# Patient Record
Sex: Female | Born: 1935
Health system: Southern US, Community
[De-identification: ages and names within clinical notes are randomized; demographics above are authoritative.]

## PROBLEM LIST (undated history)

## (undated) DIAGNOSIS — H409 Unspecified glaucoma: Secondary | ICD-10-CM

## (undated) DIAGNOSIS — E0590 Thyrotoxicosis, unspecified without thyrotoxic crisis or storm: Secondary | ICD-10-CM

## (undated) DIAGNOSIS — I10 Essential (primary) hypertension: Secondary | ICD-10-CM

## (undated) DIAGNOSIS — M1990 Unspecified osteoarthritis, unspecified site: Secondary | ICD-10-CM

## (undated) DIAGNOSIS — I872 Venous insufficiency (chronic) (peripheral): Secondary | ICD-10-CM

## (undated) DIAGNOSIS — I2699 Other pulmonary embolism without acute cor pulmonale: Secondary | ICD-10-CM

## (undated) DIAGNOSIS — I82532 Chronic embolism and thrombosis of left popliteal vein: Secondary | ICD-10-CM

## (undated) DIAGNOSIS — I517 Cardiomegaly: Secondary | ICD-10-CM

## (undated) DIAGNOSIS — I82409 Acute embolism and thrombosis of unspecified deep veins of unspecified lower extremity: Secondary | ICD-10-CM

## (undated) HISTORY — PX: BREAST LUMPECTOMY: SHX2

## (undated) HISTORY — DX: Unspecified glaucoma: H40.9

## (undated) HISTORY — DX: Unspecified osteoarthritis, unspecified site: M19.90

## (undated) HISTORY — PX: TUBAL LIGATION: SHX77

---

## 2006-03-02 ENCOUNTER — Ambulatory Visit: Payer: Self-pay | Admitting: Cardiology

## 2006-03-15 ENCOUNTER — Ambulatory Visit: Payer: Self-pay | Admitting: Cardiology

## 2006-03-25 ENCOUNTER — Ambulatory Visit: Payer: Self-pay | Admitting: Internal Medicine

## 2006-03-25 ENCOUNTER — Inpatient Hospital Stay (HOSPITAL_BASED_OUTPATIENT_CLINIC_OR_DEPARTMENT_OTHER): Admission: RE | Admit: 2006-03-25 | Discharge: 2006-03-25 | Payer: Self-pay | Admitting: Internal Medicine

## 2006-04-11 ENCOUNTER — Ambulatory Visit: Payer: Self-pay | Admitting: Cardiology

## 2006-07-18 ENCOUNTER — Ambulatory Visit: Payer: Self-pay | Admitting: Internal Medicine

## 2006-07-18 ENCOUNTER — Ambulatory Visit (HOSPITAL_COMMUNITY): Admission: RE | Admit: 2006-07-18 | Discharge: 2006-07-18 | Payer: Self-pay | Admitting: Gastroenterology

## 2013-07-16 ENCOUNTER — Other Ambulatory Visit (HOSPITAL_COMMUNITY): Payer: Self-pay | Admitting: Endocrinology

## 2013-07-16 DIAGNOSIS — E0590 Thyrotoxicosis, unspecified without thyrotoxic crisis or storm: Secondary | ICD-10-CM

## 2013-07-25 ENCOUNTER — Encounter (HOSPITAL_COMMUNITY)
Admission: RE | Admit: 2013-07-25 | Discharge: 2013-07-25 | Disposition: A | Discharge status: Home / Self Care | Payer: Medicare Other | Source: Ambulatory Visit | Attending: Endocrinology | Admitting: Endocrinology

## 2013-07-25 DIAGNOSIS — E0590 Thyrotoxicosis, unspecified without thyrotoxic crisis or storm: Secondary | ICD-10-CM | POA: Insufficient documentation

## 2013-07-26 ENCOUNTER — Encounter (HOSPITAL_COMMUNITY)
Admission: RE | Admit: 2013-07-26 | Discharge: 2013-07-26 | Disposition: A | Discharge status: Home / Self Care | Payer: Medicare Other | Source: Ambulatory Visit | Attending: Endocrinology | Admitting: Endocrinology

## 2013-07-26 ENCOUNTER — Encounter (HOSPITAL_COMMUNITY): Payer: Self-pay

## 2013-07-26 HISTORY — DX: Thyrotoxicosis, unspecified without thyrotoxic crisis or storm: E05.90

## 2013-07-26 MED ORDER — SODIUM PERTECHNETATE TC 99M INJECTION
11.0000 | Freq: Once | INTRAVENOUS | Status: AC | PRN
  Administered 2013-07-26: 11 via INTRAVENOUS

## 2013-07-26 MED ORDER — SODIUM IODIDE I 131 CAPSULE
16.6000 | Freq: Once | INTRAVENOUS | Status: AC | PRN
  Administered 2013-07-26: 16.6 via ORAL

## 2013-08-03 ENCOUNTER — Other Ambulatory Visit: Payer: Self-pay | Admitting: Endocrinology

## 2013-08-03 DIAGNOSIS — E041 Nontoxic single thyroid nodule: Secondary | ICD-10-CM

## 2013-08-07 ENCOUNTER — Other Ambulatory Visit: Payer: Self-pay | Admitting: Endocrinology

## 2013-08-07 DIAGNOSIS — E041 Nontoxic single thyroid nodule: Secondary | ICD-10-CM

## 2013-08-14 ENCOUNTER — Ambulatory Visit
Admission: RE | Admit: 2013-08-14 | Discharge: 2013-08-14 | Disposition: A | Discharge status: Home / Self Care | Payer: Medicare Other | Source: Ambulatory Visit | Attending: Endocrinology | Admitting: Endocrinology

## 2013-08-14 ENCOUNTER — Other Ambulatory Visit (HOSPITAL_COMMUNITY)
Admission: RE | Admit: 2013-08-14 | Discharge: 2013-08-14 | Disposition: A | Discharge status: Home / Self Care | Payer: Medicare Other | Source: Ambulatory Visit | Attending: Interventional Radiology | Admitting: Interventional Radiology

## 2013-08-14 DIAGNOSIS — E041 Nontoxic single thyroid nodule: Secondary | ICD-10-CM | POA: Insufficient documentation

## 2016-02-17 DIAGNOSIS — I10 Essential (primary) hypertension: Secondary | ICD-10-CM | POA: Diagnosis not present

## 2016-02-17 DIAGNOSIS — R350 Frequency of micturition: Secondary | ICD-10-CM | POA: Diagnosis not present

## 2016-02-17 DIAGNOSIS — Z87891 Personal history of nicotine dependence: Secondary | ICD-10-CM | POA: Diagnosis not present

## 2016-02-17 DIAGNOSIS — M79609 Pain in unspecified limb: Secondary | ICD-10-CM | POA: Diagnosis not present

## 2016-02-17 DIAGNOSIS — Z6838 Body mass index (BMI) 38.0-38.9, adult: Secondary | ICD-10-CM | POA: Diagnosis not present

## 2016-03-31 DIAGNOSIS — Z01419 Encounter for gynecological examination (general) (routine) without abnormal findings: Secondary | ICD-10-CM | POA: Diagnosis not present

## 2016-03-31 DIAGNOSIS — K625 Hemorrhage of anus and rectum: Secondary | ICD-10-CM | POA: Diagnosis not present

## 2016-05-19 DIAGNOSIS — K625 Hemorrhage of anus and rectum: Secondary | ICD-10-CM | POA: Diagnosis not present

## 2016-05-19 DIAGNOSIS — Z800 Family history of malignant neoplasm of digestive organs: Secondary | ICD-10-CM | POA: Diagnosis not present

## 2016-05-26 DIAGNOSIS — R5383 Other fatigue: Secondary | ICD-10-CM | POA: Diagnosis not present

## 2016-05-26 DIAGNOSIS — Z6838 Body mass index (BMI) 38.0-38.9, adult: Secondary | ICD-10-CM | POA: Diagnosis not present

## 2016-05-26 DIAGNOSIS — Z7189 Other specified counseling: Secondary | ICD-10-CM | POA: Diagnosis not present

## 2016-05-26 DIAGNOSIS — Z0000 Encounter for general adult medical examination without abnormal findings: Secondary | ICD-10-CM | POA: Diagnosis not present

## 2016-05-26 DIAGNOSIS — Z299 Encounter for prophylactic measures, unspecified: Secondary | ICD-10-CM | POA: Diagnosis not present

## 2016-05-26 DIAGNOSIS — E559 Vitamin D deficiency, unspecified: Secondary | ICD-10-CM | POA: Diagnosis not present

## 2016-05-26 DIAGNOSIS — Z1211 Encounter for screening for malignant neoplasm of colon: Secondary | ICD-10-CM | POA: Diagnosis not present

## 2016-05-26 DIAGNOSIS — N390 Urinary tract infection, site not specified: Secondary | ICD-10-CM | POA: Diagnosis not present

## 2016-05-26 DIAGNOSIS — Z1389 Encounter for screening for other disorder: Secondary | ICD-10-CM | POA: Diagnosis not present

## 2016-05-26 DIAGNOSIS — E7800 Pure hypercholesterolemia, unspecified: Secondary | ICD-10-CM | POA: Diagnosis not present

## 2016-05-26 DIAGNOSIS — Z79899 Other long term (current) drug therapy: Secondary | ICD-10-CM | POA: Diagnosis not present

## 2016-05-26 DIAGNOSIS — R350 Frequency of micturition: Secondary | ICD-10-CM | POA: Diagnosis not present

## 2016-06-08 DIAGNOSIS — Z800 Family history of malignant neoplasm of digestive organs: Secondary | ICD-10-CM | POA: Diagnosis not present

## 2016-06-08 DIAGNOSIS — Z9851 Tubal ligation status: Secondary | ICD-10-CM | POA: Diagnosis not present

## 2016-06-08 DIAGNOSIS — I10 Essential (primary) hypertension: Secondary | ICD-10-CM | POA: Diagnosis not present

## 2016-06-08 DIAGNOSIS — Z79899 Other long term (current) drug therapy: Secondary | ICD-10-CM | POA: Diagnosis not present

## 2016-06-08 DIAGNOSIS — D122 Benign neoplasm of ascending colon: Secondary | ICD-10-CM | POA: Diagnosis not present

## 2016-06-08 DIAGNOSIS — D123 Benign neoplasm of transverse colon: Secondary | ICD-10-CM | POA: Diagnosis not present

## 2016-06-08 DIAGNOSIS — K625 Hemorrhage of anus and rectum: Secondary | ICD-10-CM | POA: Diagnosis not present

## 2016-06-08 DIAGNOSIS — Z8489 Family history of other specified conditions: Secondary | ICD-10-CM | POA: Diagnosis not present

## 2016-06-11 DIAGNOSIS — I10 Essential (primary) hypertension: Secondary | ICD-10-CM | POA: Diagnosis not present

## 2016-06-11 DIAGNOSIS — M79609 Pain in unspecified limb: Secondary | ICD-10-CM | POA: Diagnosis not present

## 2016-06-11 DIAGNOSIS — I491 Atrial premature depolarization: Secondary | ICD-10-CM | POA: Diagnosis not present

## 2016-06-21 DIAGNOSIS — M79609 Pain in unspecified limb: Secondary | ICD-10-CM | POA: Diagnosis not present

## 2016-06-21 DIAGNOSIS — Z800 Family history of malignant neoplasm of digestive organs: Secondary | ICD-10-CM | POA: Diagnosis not present

## 2016-06-21 DIAGNOSIS — D122 Benign neoplasm of ascending colon: Secondary | ICD-10-CM | POA: Diagnosis not present

## 2016-06-21 DIAGNOSIS — K625 Hemorrhage of anus and rectum: Secondary | ICD-10-CM | POA: Diagnosis not present

## 2016-06-21 DIAGNOSIS — Z1211 Encounter for screening for malignant neoplasm of colon: Secondary | ICD-10-CM | POA: Diagnosis not present

## 2016-06-30 DIAGNOSIS — I739 Peripheral vascular disease, unspecified: Secondary | ICD-10-CM | POA: Diagnosis not present

## 2016-07-14 DIAGNOSIS — I10 Essential (primary) hypertension: Secondary | ICD-10-CM | POA: Diagnosis not present

## 2016-07-14 DIAGNOSIS — N390 Urinary tract infection, site not specified: Secondary | ICD-10-CM | POA: Diagnosis not present

## 2016-07-14 DIAGNOSIS — E7800 Pure hypercholesterolemia, unspecified: Secondary | ICD-10-CM | POA: Diagnosis not present

## 2016-07-14 DIAGNOSIS — R350 Frequency of micturition: Secondary | ICD-10-CM | POA: Diagnosis not present

## 2016-07-29 ENCOUNTER — Encounter: Payer: Self-pay | Admitting: Surgery

## 2016-08-02 ENCOUNTER — Encounter: Payer: Medicare Other | Admitting: Surgery

## 2016-08-10 ENCOUNTER — Emergency Department (HOSPITAL_COMMUNITY): Payer: PPO

## 2016-08-10 ENCOUNTER — Encounter (HOSPITAL_COMMUNITY): Payer: Self-pay | Admitting: Emergency Medicine

## 2016-08-10 ENCOUNTER — Inpatient Hospital Stay (HOSPITAL_COMMUNITY)
Admission: EM | Admit: 2016-08-10 | Discharge: 2016-08-12 | DRG: 176 | Disposition: A | Discharge status: Home / Self Care | Payer: PPO | Attending: Family Medicine | Admitting: Family Medicine

## 2016-08-10 ENCOUNTER — Other Ambulatory Visit: Payer: Self-pay

## 2016-08-10 DIAGNOSIS — E0590 Thyrotoxicosis, unspecified without thyrotoxic crisis or storm: Secondary | ICD-10-CM | POA: Diagnosis present

## 2016-08-10 DIAGNOSIS — I82629 Acute embolism and thrombosis of deep veins of unspecified upper extremity: Secondary | ICD-10-CM

## 2016-08-10 DIAGNOSIS — I82432 Acute embolism and thrombosis of left popliteal vein: Secondary | ICD-10-CM | POA: Diagnosis present

## 2016-08-10 DIAGNOSIS — I2699 Other pulmonary embolism without acute cor pulmonale: Principal | ICD-10-CM | POA: Diagnosis present

## 2016-08-10 DIAGNOSIS — R079 Chest pain, unspecified: Secondary | ICD-10-CM | POA: Diagnosis not present

## 2016-08-10 DIAGNOSIS — I10 Essential (primary) hypertension: Secondary | ICD-10-CM | POA: Diagnosis not present

## 2016-08-10 DIAGNOSIS — I82409 Acute embolism and thrombosis of unspecified deep veins of unspecified lower extremity: Secondary | ICD-10-CM | POA: Insufficient documentation

## 2016-08-10 DIAGNOSIS — R0602 Shortness of breath: Secondary | ICD-10-CM | POA: Diagnosis not present

## 2016-08-10 DIAGNOSIS — I824Z2 Acute embolism and thrombosis of unspecified deep veins of left distal lower extremity: Secondary | ICD-10-CM

## 2016-08-10 DIAGNOSIS — Z87891 Personal history of nicotine dependence: Secondary | ICD-10-CM

## 2016-08-10 DIAGNOSIS — R0600 Dyspnea, unspecified: Secondary | ICD-10-CM

## 2016-08-10 HISTORY — DX: Essential (primary) hypertension: I10

## 2016-08-10 HISTORY — DX: Cardiomegaly: I51.7

## 2016-08-10 LAB — TSH: TSH: 0.545 u[IU]/mL (ref 0.350–4.500)

## 2016-08-10 LAB — CBC WITH DIFFERENTIAL/PLATELET
BASOS ABS: 0 10*3/uL (ref 0.0–0.1)
BASOS PCT: 0 %
EOS ABS: 0.1 10*3/uL (ref 0.0–0.7)
Eosinophils Relative: 1 %
HCT: 39.3 % (ref 36.0–46.0)
Hemoglobin: 12.8 g/dL (ref 12.0–15.0)
Lymphocytes Relative: 24 %
Lymphs Abs: 3 10*3/uL (ref 0.7–4.0)
MCH: 28.1 pg (ref 26.0–34.0)
MCHC: 32.6 g/dL (ref 30.0–36.0)
MCV: 86.2 fL (ref 78.0–100.0)
MONO ABS: 0.8 10*3/uL (ref 0.1–1.0)
MONOS PCT: 7 %
NEUTROS ABS: 8.4 10*3/uL — AB (ref 1.7–7.7)
Neutrophils Relative %: 68 %
PLATELETS: 253 10*3/uL (ref 150–400)
RBC: 4.56 MIL/uL (ref 3.87–5.11)
RDW: 14.6 % (ref 11.5–15.5)
WBC: 12.3 10*3/uL — ABNORMAL HIGH (ref 4.0–10.5)

## 2016-08-10 LAB — BASIC METABOLIC PANEL
ANION GAP: 7 (ref 5–15)
BUN: 12 mg/dL (ref 6–20)
CALCIUM: 9.4 mg/dL (ref 8.9–10.3)
CO2: 29 mmol/L (ref 22–32)
CREATININE: 0.66 mg/dL (ref 0.44–1.00)
Chloride: 104 mmol/L (ref 101–111)
Glucose, Bld: 96 mg/dL (ref 65–99)
Potassium: 3.9 mmol/L (ref 3.5–5.1)
SODIUM: 140 mmol/L (ref 135–145)

## 2016-08-10 LAB — TROPONIN I: TROPONIN I: 0.03 ng/mL — AB (ref <0.03)

## 2016-08-10 LAB — BRAIN NATRIURETIC PEPTIDE: B NATRIURETIC PEPTIDE 5: 17 pg/mL (ref 0.0–100.0)

## 2016-08-10 MED ORDER — SODIUM CHLORIDE 0.9 % IV SOLN
INTRAVENOUS | Status: DC
  Administered 2016-08-10: 22:00:00 via INTRAVENOUS

## 2016-08-10 MED ORDER — ENOXAPARIN SODIUM 100 MG/ML ~~LOC~~ SOLN
100.0000 mg | Freq: Two times a day (BID) | SUBCUTANEOUS | Status: DC
  Administered 2016-08-11 – 2016-08-12 (×3): 100 mg via SUBCUTANEOUS
  Filled 2016-08-10 (×3): qty 1

## 2016-08-10 MED ORDER — ENOXAPARIN SODIUM 100 MG/ML ~~LOC~~ SOLN
1.0000 mg/kg | Freq: Once | SUBCUTANEOUS | Status: AC
  Administered 2016-08-10: 95 mg via SUBCUTANEOUS
  Filled 2016-08-10: qty 1

## 2016-08-10 MED ORDER — ONDANSETRON HCL 4 MG PO TABS: 4.0000 mg | ORAL_TABLET | Freq: Four times a day (QID) | ORAL | Status: DC | PRN

## 2016-08-10 MED ORDER — ASPIRIN 81 MG PO CHEW
162.0000 mg | CHEWABLE_TABLET | Freq: Once | ORAL | Status: AC
  Administered 2016-08-10: 162 mg via ORAL
  Filled 2016-08-10: qty 2

## 2016-08-10 MED ORDER — ACETAMINOPHEN 325 MG PO TABS: 650.0000 mg | ORAL_TABLET | Freq: Four times a day (QID) | ORAL | Status: DC | PRN

## 2016-08-10 MED ORDER — MORPHINE SULFATE (PF) 2 MG/ML IV SOLN
2.0000 mg | INTRAVENOUS | Status: DC | PRN
  Administered 2016-08-10 – 2016-08-11 (×2): 2 mg via INTRAVENOUS
  Filled 2016-08-10 (×2): qty 1

## 2016-08-10 MED ORDER — METOPROLOL SUCCINATE ER 25 MG PO TB24
12.5000 mg | ORAL_TABLET | Freq: Every day | ORAL | Status: DC
  Administered 2016-08-11 – 2016-08-12 (×2): 12.5 mg via ORAL
  Filled 2016-08-10 (×2): qty 1

## 2016-08-10 MED ORDER — OXYCODONE-ACETAMINOPHEN 5-325 MG PO TABS: 1.0000 | ORAL_TABLET | ORAL | Status: DC | PRN

## 2016-08-10 MED ORDER — MORPHINE SULFATE (PF) 2 MG/ML IV SOLN
2.0000 mg | INTRAVENOUS | Status: DC | PRN
  Administered 2016-08-11: 2 mg via INTRAVENOUS
  Filled 2016-08-10: qty 1

## 2016-08-10 MED ORDER — ACETAMINOPHEN 650 MG RE SUPP
650.0000 mg | Freq: Four times a day (QID) | RECTAL | Status: DC | PRN
Start: 2016-08-10 — End: 2016-08-12

## 2016-08-10 MED ORDER — LISINOPRIL 10 MG PO TABS
20.0000 mg | ORAL_TABLET | Freq: Every day | ORAL | Status: DC
  Administered 2016-08-11 – 2016-08-12 (×2): 20 mg via ORAL
  Filled 2016-08-10 (×2): qty 2

## 2016-08-10 MED ORDER — IOPAMIDOL (ISOVUE-370) INJECTION 76%
100.0000 mL | Freq: Once | INTRAVENOUS | Status: AC | PRN
  Administered 2016-08-10: 100 mL via INTRAVENOUS

## 2016-08-10 MED ORDER — ONDANSETRON HCL 4 MG/2ML IJ SOLN: 4.0000 mg | Freq: Four times a day (QID) | INTRAMUSCULAR | Status: DC | PRN

## 2016-08-10 NOTE — ED Notes (Signed)
CRITICAL VALUE ALERT  Critical value received:  Trop 0.03  Date of notification:  08/10/16  Time of notification: 1548  Critical value read back:Yes.    Nurse who received alert:  R.Meredyth Hornung

## 2016-08-10 NOTE — ED Notes (Signed)
Returned from CT.

## 2016-08-10 NOTE — ED Provider Notes (Signed)
[facility] DEPT Provider Note   CSN: [id number] Arrival date & time: 08/10/16  1358     History   Chief Complaint Chief Complaint  Patient presents with  . Shortness of Breath    HPI Diana [name] is a 82 y.o. female.  Patient with history of high blood pressure and hyperthyroidism, blood clot years ago over 20 years presents with shortness of breath, chest discomfort and left leg swelling. Symptoms mostly for the past 2 days. Patient had brief one hour duration of chest pressure that resolved earlier today. No history of similar. No cardiac history. No blood clot history, no active cancer, no recent surgeries. Mild calf ache which is new for the past 2 days. No injuries. Mild exertional shortness of breath. No history of atrial fibrillation. No significant symptoms currently.      Past Medical History:  Diagnosis Date  . Enlarged heart   . Hypertension   . Hyperthyroidism     Patient Active Problem List   Diagnosis Date Noted  . DVT (deep vein thrombosis) in pregnancy 08/10/2016  . Pulmonary embolism (HCC) 08/10/2016    Past Surgical History:  Procedure Laterality Date  . BREAST LUMPECTOMY    . TUBAL LIGATION      OB History    No data available       Home Medications    Prior to Admission medications   Medication Sig Start Date End Date Taking? Authorizing Provider  aspirin EC 81 MG tablet Take 81 mg by mouth daily.   Yes Historical Provider, MD  lisinopril (PRINIVIL,ZESTRIL) 20 MG tablet Take 20 mg by mouth daily.  06/11/16  Yes Historical Provider, MD  meclizine (ANTIVERT) 12.5 MG tablet Take 12.5 mg by mouth 3 (three) times daily as needed for dizziness or nausea.  07/14/16  Yes Historical Provider, MD  metoprolol succinate (TOPROL-XL) 25 MG 24 hr tablet Take 12.5 mg by mouth daily. 06/11/16  Yes Historical Provider, MD    Family History History reviewed. No pertinent family history.  Social History Social History  Substance Use Topics  .  Smoking status: Former Games developer  . Smokeless tobacco: Never Used  . Alcohol use No     Allergies   Review of patient's allergies indicates no known allergies.   Review of Systems Review of Systems  Constitutional: Positive for fatigue. Negative for chills and fever.  HENT: Negative for congestion.   Eyes: Negative for visual disturbance.  Respiratory: Positive for shortness of breath.   Cardiovascular: Positive for chest pain and leg swelling.  Gastrointestinal: Negative for abdominal pain and vomiting.  Genitourinary: Negative for dysuria and flank pain.  Musculoskeletal: Negative for back pain, neck pain and neck stiffness.  Skin: Negative for rash.  Neurological: Negative for light-headedness and headaches.     Physical Exam Updated Vital Signs BP 153/81 (BP Location: Left Arm)   Pulse (!) 55   Temp 98.7 F (37.1 C) (Oral)   Resp 18   Ht 5\' 3"  (1.6 m)   Wt 210 lb (95.3 kg)   SpO2 99%   BMI 37.20 kg/m   Physical Exam  Constitutional: She appears well-developed and well-nourished. No distress.  HENT:  Head: Normocephalic and atraumatic.  Eyes: Conjunctivae are normal.  Neck: Neck supple.  Cardiovascular: Normal rate and regular rhythm.   No murmur heard. Pulmonary/Chest: Effort normal and breath sounds normal. No respiratory distress.  Abdominal: Soft. There is no tenderness.  Musculoskeletal: She exhibits edema and tenderness (mild tenderness and swelling left calfneurovascular intact  distal left leg).  Neurological: She is alert.  Skin: Skin is warm and dry.  Psychiatric: She has a normal mood and affect.  Nursing note and vitals reviewed.    ED Treatments / Results  Labs (all labs ordered are listed, but only abnormal results are displayed) Labs Reviewed  CBC WITH DIFFERENTIAL/PLATELET - Abnormal; Notable for the following:       Result Value   WBC 12.3 (*)    Neutro Abs 8.4 (*)    All other components within normal limits  TROPONIN I - Abnormal;  Notable for the following:    Troponin I 0.03 (*)    All other components within normal limits  BASIC METABOLIC PANEL  TROPONIN I  BRAIN NATRIURETIC PEPTIDE    EKG  EKG Interpretation  Date/Time:  Tuesday August 10 2016 17:23:32 EDT Ventricular Rate:  92 PR Interval:    QRS Duration: 73 QT Interval:  386 QTC Calculation: 478 R Axis:   52 Text Interpretation:  Sinus rhythm Multiple premature complexes, vent & supraven Borderline repolarization abnormality Confirmed by Jodi MourningZAVITZ MD, Sakia Schrimpf [phone]) on 08/10/2016 5:59:23 PM       Radiology Dg Chest 2 View  Result Date: 08/10/2016 CLINICAL DATA:  Chest pain. EXAM: CHEST  2 VIEW COMPARISON:  09/25/2011 . FINDINGS: Mediastinum hilar structures normal. Cardiomegaly. No focal pulmonary infiltrate. No pleural effusion or pneumothorax. No acute bony abnormality. Degenerative changes thoracic spine . IMPRESSION: 1. Cardiomegaly.  No pulmonary venous congestion. 2.  No acute pulmonary disease. Electronically Signed   By: Maisie Fushomas  Register   On: 08/10/2016 14:53   Ct Angio Chest Pe W And/or Wo Contrast  Result Date: 08/10/2016 CLINICAL DATA:  Shortness of breath, chest pain, left leg pain for 3 months. Acute left popliteal DVT. EXAM: CT ANGIOGRAPHY CHEST WITH CONTRAST TECHNIQUE: Multidetector CT imaging of the chest was performed using the standard protocol during bolus administration of intravenous contrast. Multiplanar CT image reconstructions and MIPs were obtained to evaluate the vascular anatomy. CONTRAST:  100 cc Isovue 370 COMPARISON:  08/10/2016 FINDINGS: Cardiovascular: Thoracic aortic atherosclerosis noted without aneurysm, dissection, intramural hemorrhage, or mediastinal hematoma. Major branch vessels appear patent. Three vessel arch anatomy noted. Pulmonary arteries are well visualized with contrast. Tiny peripheral right pulmonary artery filling defects in the right upper lobe, image 27, and in the right lower lobe, images 51 through 57.  These are compatible with small right upper lobe and right lower lobe segmental pulmonary emboli. No significant left pulmonary embolus appreciated or central embolus. No evidence of heart strain by CT. RV/LV ratio 0.7 Normal heart size.  No pericardial effusion. Mediastinum/Nodes: No enlarged mediastinal, hilar, or axillary lymph nodes. Thyroid gland, trachea, and esophagus demonstrate no significant findings. Lungs/Pleura: Lungs are clear. No pleural effusion or pneumothorax. Upper Abdomen: Scattered varying size hypodense hepatic cysts. Largest posterior right hepatic dome cyst measures 5.6 cm, image 72. Mild adrenal thickening bilaterally, suspect hyperplasia. No other acute intra-abdominal process. Small calcified lymph node in the liver hilum noted. Musculoskeletal: diffuse degenerative changes of the spine. No compression fracture. Sternum intact. Review of the MIP images confirms the above findings. IMPRESSION: Small acute peripheral right upper lobe and right lower lobe segmental pulmonary emboli. No significant central or proximal hilar embolus. Negative for heart strain. Thoracic aortic atherosclerosis Scattered hepatic cysts of varying size. These results will be called to the ordering clinician or representative by the Radiologist Assistant, and communication documented in the PACS or zVision Dashboard. Electronically Signed   By: Judie PetitM.  Shick M.D.   On: 08/10/2016 18:37   US Venous Img Lower Unilateral Left  Result Date: 08/10/2016 CLINICAL DATA:  Left lower extremity edema and pain. EXAM: LEFT LOWER EXTREMITY VENOUS DOPPLER ULTRASOUND TECHNIQUE: Gray-scale sonography with graded compression, as well as color Doppler and duplex ultrasound were performed to evaluate the lower extremity deep venous systems from the level of the common femoral vein and including the common femoral, femoral, profunda femoral, popliteal and calf veins including the posterior tibial, peroneal and gastrocnemius veins when  visible. The superficial great saphenous vein was also interrogated. Spectral Doppler was utilized to evaluate flow at rest and with distal augmentation maneuvers in the common femoral, femoral and popliteal veins. COMPARISON:  None. FINDINGS: Contralateral Common Femoral Vein: Respiratory phasicity is normal and symmetric with the symptomatic side. No evidence of thrombus. Normal compressibility. Common Femoral Vein: No evidence of thrombus. Normal compressibility, respiratory phasicity and response to augmentation. Saphenofemoral Junction: No evidence of thrombus. Normal compressibility and flow on color Doppler imaging. Profunda Femoral Vein: No evidence of thrombus. Normal compressibility and flow on color Doppler imaging. Femoral Vein: No evidence of thrombus. Normal compressibility, respiratory phasicity and response to augmentation. Popliteal Vein: Left popliteal vein demonstrate intraluminal hypoechoic thrombus and is noncompressible. The popliteal thrombus appears occlusive. This involves the distal popliteal vein extending into the calf veins. Calf Veins: Popliteal thrombus appears to extend into the tibial and peroneal calf veins. Superficial Great Saphenous Vein: No evidence of thrombus. Normal compressibility and flow on color Doppler imaging. Venous Reflux:  None. Other Findings:  None. IMPRESSION: Acute occlusive distal left popliteal DVT extending into the tibial and peroneal calf veins. These results will be called to the ordering clinician or representative by the Radiologist Assistant, and communication documented in the PACS or zVision Dashboard. Electronically Signed   By: Judie Petit.  Shick M.D.   On: 08/10/2016 18:12    Procedures Procedures (including critical care time) CRITICAL CARE Performed by: Enid Skeens   Total critical care time: 35 minutes  Critical care time was exclusive of separately billable procedures and treating other patients.  Critical care was necessary to treat or  prevent imminent or life-threatening deterioration.  Critical care was time spent personally by me on the following activities: development of treatment plan with patient and/or surrogate as well as nursing, discussions with consultants, evaluation of patient's response to treatment, examination of patient, obtaining history from patient or surrogate, ordering and performing treatments and interventions, ordering and review of laboratory studies, ordering and review of radiographic studies, pulse oximetry and re-evaluation of patient's condition.  Medications Ordered in ED Medications  aspirin chewable tablet 162 mg (162 mg Oral Given 08/10/16 1828)  iopamidol (ISOVUE-370) 76 % injection 100 mL (100 mLs Intravenous Contrast Given 08/10/16 1809)  enoxaparin (LOVENOX) injection 95 mg (95 mg Subcutaneous Given 08/10/16 1920)     Initial Impression / Assessment and Plan / ED Course  I have reviewed the triage vital signs and the nursing notes.  Pertinent labs & imaging results that were available during my care of the patient were reviewed by me and considered in my medical decision making (see chart for details).  Clinical Course   Patient presents with chest pressure and shortness of breath. With age, new symptoms discuss concern for ACS versus atrial fibrillation versus blood clot. Plan for CT angina the chest, troponin and observation in the hospital.  CT scan confirmed acute pulmonary embolism. Lovenox ordered. Plan for telemetry observation for further evaluation and echo.  The patients results and plan were reviewed and discussed.   Any x-rays performed were independently reviewed by myself.   Differential diagnosis were considered with the presenting HPI.  Medications  aspirin chewable tablet 162 mg (162 mg Oral Given 08/10/16 1828)  iopamidol (ISOVUE-370) 76 % injection 100 mL (100 mLs Intravenous Contrast Given 08/10/16 1809)  enoxaparin (LOVENOX) injection 95 mg (95 mg Subcutaneous  Given 08/10/16 1920)    Vitals:   08/10/16 1630 08/10/16 1700 08/10/16 1747 08/10/16 1820  BP: 137/92 134/72  153/81  Pulse: 84 77  (!) 55  Resp: 16 16  18   Temp: 98.7 F (37.1 C)     TempSrc: Oral     SpO2: 100% 99% 99% 99%  Weight:      Height:        Final diagnoses:  Other acute pulmonary embolism (HCC)  Acute dyspnea  Acute deep vein thrombosis (DVT) of distal end of left lower extremity (HCC)    Admission/ observation were discussed with the admitting physician, patient and/or family and they are comfortable with the plan.   Final Clinical Impressions(s) / ED Diagnoses   Final diagnoses:  Other acute pulmonary embolism (HCC)  Acute dyspnea  Acute deep vein thrombosis (DVT) of distal end of left lower extremity [facility])    New Prescriptions New Prescriptions   No medications on file     Blane Ohara, MD 08/10/16 1947

## 2016-08-10 NOTE — ED Notes (Signed)
ekg given to dr. Manus Gunningancour.

## 2016-08-10 NOTE — ED Notes (Signed)
Patient transported to Ultrasound 

## 2016-08-10 NOTE — ED Triage Notes (Signed)
Feet and ankles started swelling today. Pt has hx of enlarged heart.  Pt is complaining of SOB now, pt denied chest pain at the moment.  Pt is able to speak in full and complete sentences.

## 2016-08-10 NOTE — H&P (Signed)
History and Physical    Diana [name] WUJ:[id number] DOB: [birth year] DOA: 08/10/2016  Referring MD/NP/PA: Blane Ohara, MD PCP: Kirstie Peri, MD Outpatient Specialists: None Patient coming from: Home  Chief Complaint: SOB and left leg edema  HPI: Diana [name] is a 82 y.o. female with medical history significant of HTN, hyperthyroidism, blood clot over 20 years ago presents to the Ed with SOB, chest discomfort, and left leg edema. Pt reports that she was getting up this morning when her leg started to hurt. She still has pain in her left leg. She also has been having intermittent CP for the past few days. She denies any recent surgery history. She did have a colonoscopy in July which several polyps were found.  ED Course: While in the ED, BMP unremarkable, troponin 0.03, WBC 12.3, CTA shows small acute peripheral right upper lobe and right lower lobe segmental pulmonary emboli. US of the LLE shows acute occlusive distal left popliteal DVT extending into the tibial and peroneal calf veins. She will be admitted for treatment of her PE.  Review of Systems: As per HPI otherwise 10 point review of systems negative.   Past Medical History:  Diagnosis Date  . Enlarged heart   . Hypertension   . Hyperthyroidism     Past Surgical History:  Procedure Laterality Date  . BREAST LUMPECTOMY    . TUBAL LIGATION       reports that she has quit smoking. She has never used smokeless tobacco. She reports that she does not drink alcohol or use drugs.  No Known Allergies  History reviewed. No pertinent family history. Unacceptable: Noncontributory, unremarkable, or negative. Acceptable: Family history reviewed and not pertinent (If you reviewed it)  Prior to Admission medications   Medication Sig Start Date End Date Taking? Authorizing Provider  aspirin EC 81 MG tablet Take 81 mg by mouth daily.   Yes Historical Provider, MD  lisinopril (PRINIVIL,ZESTRIL) 20 MG tablet Take 20 mg by  mouth daily.  06/11/16  Yes Historical Provider, MD  meclizine (ANTIVERT) 12.5 MG tablet Take 12.5 mg by mouth 3 (three) times daily as needed for dizziness or nausea.  07/14/16  Yes Historical Provider, MD  metoprolol succinate (TOPROL-XL) 25 MG 24 hr tablet Take 12.5 mg by mouth daily. 06/11/16  Yes Historical Provider, MD    Physical Exam: Vitals:   08/10/16 1630 08/10/16 1700 08/10/16 1747 08/10/16 1820  BP: 137/92 134/72  153/81  Pulse: 84 77  (!) 55  Resp: 16 16  18   Temp: 98.7 F (37.1 C)     TempSrc: Oral     SpO2: 100% 99% 99% 99%  Weight:      Height:          Constitutional: NAD, calm, comfortable Vitals:   08/10/16 1630 08/10/16 1700 08/10/16 1747 08/10/16 1820  BP: 137/92 134/72  153/81  Pulse: 84 77  (!) 55  Resp: 16 16  18   Temp: 98.7 F (37.1 C)     TempSrc: Oral     SpO2: 100% 99% 99% 99%  Weight:      Height:       Eyes: PERRL, lids and conjunctivae normal ENMT: Mucous membranes are moist. Posterior pharynx clear of any exudate or lesions.Normal dentition.  Neck: normal, supple, no masses, no thyromegaly Respiratory: clear to auscultation bilaterally, no wheezing, no crackles. Normal respiratory effort. No accessory muscle use.  Cardiovascular: Regular rate and rhythm, no murmurs / rubs / gallops. No extremity edema. 2+ pedal pulses. No carotid  bruits.  Abdomen: no tenderness, no masses palpated. No hepatosplenomegaly. Bowel sounds positive.  Musculoskeletal: no clubbing / cyanosis. No joint deformity upper and lower extremities. Good ROM, no contractures. Normal muscle tone.  Skin: no rashes, lesions, ulcers. No induration Neurologic: CN 2-12 grossly intact. Sensation intact, DTR normal. Strength 5/5 in all 4.  Psychiatric: Normal judgment and insight. Alert and oriented x 3. Normal mood.    Labs on Admission: I have personally reviewed following labs and imaging studies  CBC:  Recent Labs Lab 08/10/16 1426  WBC 12.3*  NEUTROABS 8.4*  HGB 12.8    HCT 39.3  MCV 86.2  PLT 253   Basic Metabolic Panel:  Recent Labs Lab 08/10/16 1426  NA 140  K 3.9  CL 104  CO2 29  GLUCOSE 96  BUN 12  CREATININE 0.66  CALCIUM 9.4   GFR: Estimated Creatinine Clearance: 61.6 mL/min (by C-G formula based on SCr of 0.66 mg/dL).  Cardiac Enzymes:  Recent Labs Lab 08/10/16 1426 08/10/16 1741  TROPONINI 0.03* <0.03   Urine analysis: No results found for: COLORURINE, APPEARANCEUR, LABSPEC, PHURINE, GLUCOSEU, HGBUR, BILIRUBINUR, KETONESUR, PROTEINUR, UROBILINOGEN, NITRITE, LEUKOCYTESUR   Radiological Exams on Admission: Dg Chest 2 View  Result Date: 08/10/2016 CLINICAL DATA:  Chest pain. EXAM: CHEST  2 VIEW COMPARISON:  09/25/2011 . FINDINGS: Mediastinum hilar structures normal. Cardiomegaly. No focal pulmonary infiltrate. No pleural effusion or pneumothorax. No acute bony abnormality. Degenerative changes thoracic spine . IMPRESSION: 1. Cardiomegaly.  No pulmonary venous congestion. 2.  No acute pulmonary disease. Electronically Signed   By: Maisie Fus  Register   On: 08/10/2016 14:53   Ct Angio Chest Pe W And/or Wo Contrast  Result Date: 08/10/2016 CLINICAL DATA:  Shortness of breath, chest pain, left leg pain for 3 months. Acute left popliteal DVT. EXAM: CT ANGIOGRAPHY CHEST WITH CONTRAST TECHNIQUE: Multidetector CT imaging of the chest was performed using the standard protocol during bolus administration of intravenous contrast. Multiplanar CT image reconstructions and MIPs were obtained to evaluate the vascular anatomy. CONTRAST:  100 cc Isovue 370 COMPARISON:  08/10/2016 FINDINGS: Cardiovascular: Thoracic aortic atherosclerosis noted without aneurysm, dissection, intramural hemorrhage, or mediastinal hematoma. Major branch vessels appear patent. Three vessel arch anatomy noted. Pulmonary arteries are well visualized with contrast. Tiny peripheral right pulmonary artery filling defects in the right upper lobe, image 27, and in the right lower  lobe, images 51 through 57. These are compatible with small right upper lobe and right lower lobe segmental pulmonary emboli. No significant left pulmonary embolus appreciated or central embolus. No evidence of heart strain by CT. RV/LV ratio 0.7 Normal heart size.  No pericardial effusion. Mediastinum/Nodes: No enlarged mediastinal, hilar, or axillary lymph nodes. Thyroid gland, trachea, and esophagus demonstrate no significant findings. Lungs/Pleura: Lungs are clear. No pleural effusion or pneumothorax. Upper Abdomen: Scattered varying size hypodense hepatic cysts. Largest posterior right hepatic dome cyst measures 5.6 cm, image 72. Mild adrenal thickening bilaterally, suspect hyperplasia. No other acute intra-abdominal process. Small calcified lymph node in the liver hilum noted. Musculoskeletal: diffuse degenerative changes of the spine. No compression fracture. Sternum intact. Review of the MIP images confirms the above findings. IMPRESSION: Small acute peripheral right upper lobe and right lower lobe segmental pulmonary emboli. No significant central or proximal hilar embolus. Negative for heart strain. Thoracic aortic atherosclerosis Scattered hepatic cysts of varying size. These results will be called to the ordering clinician or representative by the Radiologist Assistant, and communication documented in the PACS or zVision Dashboard. Electronically Signed  By: M.  Shick M.D.   On: 08/10/2016 18:37   [place] Venous Img Lower UnilaterOsvaldo Shipperal Left  Result Date: 08/10/2016 CLINICAL DATA:  Left lower extremity edema and pain. EXAM: LEFT LOWER EXTREMITY VENOUS DOPPLER ULTRASOUND TECHNIQUE: Gray-scale sonography with graded compression, as well as color Doppler and duplex ultrasound were performed to evaluate the lower extremity deep venous systems from the level of the common femoral vein and including the common femoral, femoral, profunda femoral, popliteal and calf veins including the posterior tibial, peroneal and  gastrocnemius veins when visible. The superficial great saphenous vein was also interrogated. Spectral Doppler was utilized to evaluate flow at rest and with distal augmentation maneuvers in the common femoral, femoral and popliteal veins. COMPARISON:  None. FINDINGS: Contralateral Common Femoral Vein: Respiratory phasicity is normal and symmetric with the symptomatic side. No evidence of thrombus. Normal compressibility. Common Femoral Vein: No evidence of thrombus. Normal compressibility, respiratory phasicity and response to augmentation. Saphenofemoral Junction: No evidence of thrombus. Normal compressibility and flow on color Doppler imaging. Profunda Femoral Vein: No evidence of thrombus. Normal compressibility and flow on color Doppler imaging. Femoral Vein: No evidence of thrombus. Normal compressibility, respiratory phasicity and response to augmentation. Popliteal Vein: Left popliteal vein demonstrate intraluminal hypoechoic thrombus and is noncompressible. The popliteal thrombus appears occlusive. This involves the distal popliteal vein extending into the calf veins. Calf Veins: Popliteal thrombus appears to extend into the tibial and peroneal calf veins. Superficial Great Saphenous Vein: No evidence of thrombus. Normal compressibility and flow on color Doppler imaging. Venous Reflux:  None. Other Findings:  None. IMPRESSION: Acute occlusive distal left popliteal DVT extending into the tibial and peroneal calf veins. These results will be called to the ordering clinician or representative by the Radiologist Assistant, and communication documented in the PACS or zVision Dashboard. Electronically Signed   By: Judie PetitM.  Shick M.D.   On: 08/10/2016 18:12    EKG: Independently reviewed. Sinus Rhythm  Assessment/Plan    Pulmonary embolism (HCC)   DVT   HTN   Hyperthyrodism.  1. DVT  US of LLE showed acute occlusive distal left popliteal DVT extending into the tibial and peroneal calf veins. She report  that pain remains in her left leg. Start on pain meds. Continue to monitor. 2. PE.  This is unprovoked.  Her routine cancer screenings were good with her PCP according to her.  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  Troponin 0.03 suggested good prognostic value. Blood pressures are elevated. CTA shows small acute peripheral right upper lobe and right lower lobe segmental pulmonary emboli. Check Lupus circulating antibodies, homocysteine levels, and leiden factor 5. Will continue with Lovenox at this time.  NOAC and Coumadin discussed.  She will let us know tomorrow.  Suspect she would be fine with a NOAC. 3. HTN. Elevated. Will continue with her meds.  4. Hyperthyroidism. Stable.  Check TSH.     DVT prophylaxis: Lovenox  Code Status: Full Family Communication: Family at bedside Disposition Plan: Discharge once improved Consults called: None Admission status: Admit to inpatient    Houston SirenPeter Nikira Kushnir, MD FACP [facility] If 7PM-7AM, please contact night-coverage [email or web address] Password [email or web address] 08/10/2016, 7:07 PM   By signing my name below, I, Bobbie Stackhristopher Reid, attest that this documentation has been prepared under the direction and in the presence of Houston SirenPeter Chrisopher Pustejovsky, MD. Electronically signed: Bobbie Stackhristopher Reid, Scribe.  08/10/16, 7:20 AM

## 2016-08-10 NOTE — Progress Notes (Signed)
ANTICOAGULATION CONSULT NOTE - Initial Consult  Pharmacy Consult for Lovenox Indication: pulmonary embolus  No Known Allergies  Patient Measurements: Height: 5\' 3"  (160 cm) Weight: 209 lb 14.4 oz (95.2 kg) IBW/kg (Calculated) : 52.4  Vital Signs: Temp: 98.5 F (36.9 C) (09/19 2127) Temp Source: Oral (09/19 2127) BP: 161/56 (09/19 2127) Pulse Rate: 64 (09/19 2127)  Labs:  Recent Labs  08/10/16 1426 08/10/16 1741  HGB 12.8  --   HCT 39.3  --   PLT 253  --   CREATININE 0.66  --   TROPONINI 0.03* <0.03    Estimated Creatinine Clearance: 61.5 mL/min (by C-G formula based on SCr of 0.66 mg/dL).   Medical History: Past Medical History:  Diagnosis Date  . Enlarged heart   . Hypertension   . Hyperthyroidism     Medications:  Prescriptions Prior to Admission  Medication Sig Dispense Refill Last Dose  . aspirin EC 81 MG tablet Take 81 mg by mouth daily.   08/10/2016 at Unknown time  . lisinopril (PRINIVIL,ZESTRIL) 20 MG tablet Take 20 mg by mouth daily.    08/10/2016 at Unknown time  . meclizine (ANTIVERT) 12.5 MG tablet Take 12.5 mg by mouth 3 (three) times daily as needed for dizziness or nausea.    unknown  . metoprolol succinate (TOPROL-XL) 25 MG 24 hr tablet Take 12.5 mg by mouth daily.   08/10/2016 at Unknown time    Assessment: 82yo female with PE.  Asked to initiate full dose Lovenox.  Goal of Therapy:  Anti-Xa level 0.6-1 units/ml 4hrs after LMWH dose given Monitor platelets by anticoagulation protocol: Yes   Plan:  Lovenox 1mg /Kg SQ q12hrs (1st dose given in ED) Monitor CBC, s/sx of bleeding complications  Valrie HartHall, Kianna Billet A 08/10/2016,10:10 PM

## 2016-08-11 DIAGNOSIS — I824Z2 Acute embolism and thrombosis of unspecified deep veins of left distal lower extremity: Secondary | ICD-10-CM | POA: Diagnosis not present

## 2016-08-11 DIAGNOSIS — I2699 Other pulmonary embolism without acute cor pulmonale: Secondary | ICD-10-CM | POA: Diagnosis not present

## 2016-08-11 DIAGNOSIS — Z87891 Personal history of nicotine dependence: Secondary | ICD-10-CM | POA: Diagnosis not present

## 2016-08-11 DIAGNOSIS — I82402 Acute embolism and thrombosis of unspecified deep veins of left lower extremity: Secondary | ICD-10-CM | POA: Diagnosis not present

## 2016-08-11 DIAGNOSIS — R079 Chest pain, unspecified: Secondary | ICD-10-CM | POA: Diagnosis not present

## 2016-08-11 DIAGNOSIS — I10 Essential (primary) hypertension: Secondary | ICD-10-CM | POA: Diagnosis not present

## 2016-08-11 DIAGNOSIS — R0602 Shortness of breath: Secondary | ICD-10-CM | POA: Diagnosis not present

## 2016-08-11 DIAGNOSIS — E0590 Thyrotoxicosis, unspecified without thyrotoxic crisis or storm: Secondary | ICD-10-CM | POA: Diagnosis not present

## 2016-08-11 DIAGNOSIS — N3001 Acute cystitis with hematuria: Secondary | ICD-10-CM | POA: Diagnosis not present

## 2016-08-11 DIAGNOSIS — I82629 Acute embolism and thrombosis of deep veins of unspecified upper extremity: Secondary | ICD-10-CM | POA: Diagnosis not present

## 2016-08-11 DIAGNOSIS — I82432 Acute embolism and thrombosis of left popliteal vein: Secondary | ICD-10-CM | POA: Diagnosis not present

## 2016-08-11 LAB — CBC
HCT: 35 % — ABNORMAL LOW (ref 36.0–46.0)
HEMOGLOBIN: 11.2 g/dL — AB (ref 12.0–15.0)
MCH: 27.9 pg (ref 26.0–34.0)
MCHC: 32 g/dL (ref 30.0–36.0)
MCV: 87.3 fL (ref 78.0–100.0)
Platelets: 218 10*3/uL (ref 150–400)
RBC: 4.01 MIL/uL (ref 3.87–5.11)
RDW: 14.5 % (ref 11.5–15.5)
WBC: 8.9 10*3/uL (ref 4.0–10.5)

## 2016-08-11 LAB — BASIC METABOLIC PANEL
ANION GAP: 6 (ref 5–15)
BUN: 13 mg/dL (ref 6–20)
CALCIUM: 8.8 mg/dL — AB (ref 8.9–10.3)
CHLORIDE: 107 mmol/L (ref 101–111)
CO2: 26 mmol/L (ref 22–32)
Creatinine, Ser: 0.62 mg/dL (ref 0.44–1.00)
GFR calc non Af Amer: >60 mL/min (ref >60)
Glucose, Bld: 105 mg/dL — ABNORMAL HIGH (ref 65–99)
Potassium: 3.6 mmol/L (ref 3.5–5.1)
Sodium: 139 mmol/L (ref 135–145)

## 2016-08-11 NOTE — Care Management Obs Status (Signed)
MEDICARE OBSERVATION STATUS NOTIFICATION   Patient Details  Name: Diana [name] MRN: [id number] Date of Birth: 08/17/1934   Medicare Observation Status Notification Given:  Yes    Akirah Storck, [name], RN 08/11/2016, 11:25 AM

## 2016-08-11 NOTE — Progress Notes (Signed)
PROGRESS NOTE    Diana [name]  YNW:[id number] DOB: 12/09/1933 DOA: 08/10/2016 PCP: Kirstie PeriSHAH,ASHISH, MD   Brief Narrative:  Diana [name] is a 82 y.o. female with medical history significant of HTN, hyperthyroidism, blood clot over 20 years ago presents to the Ed with SOB, chest discomfort, and left leg edema. Pt reports that she was getting up this morning when her leg started to hurt. She still has pain in her left leg. She also has been having intermittent CP for the past few days. She denies any recent surgery history. She did have a colonoscopy in July which several polyps were found. While in the ED, BMP unremarkable, troponin 0.03, WBC 12.3, CTA shows small acute peripheral right upper lobe and right lower lobe segmental pulmonary emboli. US of the LLE shows acute occlusive distal left popliteal DVT extending into the tibial and peroneal calf veins. She will be admitted for treatment of her PE.   Assessment & Plan:   Active Problems:   Pulmonary embolism (HCC)   HTN (hypertension)  DVT  US of LLE showed acute occlusive distal left popliteal DVT extending into the tibial and peroneal calf veins - intermittent but improved pain in her left leg - continue pain medications - on lovenox- decision for anticoagulation to be made later today when patient's granddaughter discusses options with patient   PE - Unprovoked - Follow up on thrombophia labs - Patent unsure what anticoagulation she would like to go home on at this time - follow up Lupus circulating antibodies, homocysteine levels, and leiden factor 5 - patient will let physician and RN know when she decides on anticoagulation - will need to stay in hospital if she decides on Coumadin to bridge  HTN - Elevated - Continue with her home meds.   Hyperthyroidism - Stable - TSH WNL    DVT prophylaxis: Lovenox Code Status: Full Family Communication: Sister and husband bedside, all questions answered Disposition  Plan: discharge plan pending decision on anticoagulation, if patient decides on a NOAC can start and likely discharge tomorrow   Consultants:   none  Procedures:   none  Antimicrobials:   none    Subjective: Patient is sitting in bed and had just had lunch.  She voices she still has not made a decision in regards to her anticoagulation.  She is waiting to hear from her granddaughter who is a Engineer, civil (consulting)nurse.  She states her granddaughter is at class until later this afternoon.  She is hesitant to commit to a medication at this time.  She denies any chest pain, chest pressure, shortness of breath, increased work of breathing.  She does endorse some leg pain that she states is substantially better than yesterday.  Objective: Vitals:   08/10/16 2014 08/10/16 2017 08/10/16 2127 08/11/16 0400  BP: (!) 146/102 144/62 (!) 161/56 (!) 131/53  Pulse: 74 79 64 74  Resp: 20 16 20 19   Temp: 98.4 F (36.9 C) 98.4 F (36.9 C) 98.5 F (36.9 C) 98.6 F (37 C)  TempSrc: Oral Oral Oral Oral  SpO2:  100% 97% 100%  Weight:   95.2 kg (209 lb 14.4 oz)   Height:   5\' 3"  (1.6 m)     Intake/Output Summary (Last 24 hours) at 08/11/16 1439 Last data filed at 08/11/16 0900  Gross per 24 hour  Intake              890 ml  Output  100 ml  Net              790 ml   Filed Weights   08/10/16 1422 08/10/16 2127  Weight: 95.3 kg (210 lb) 95.2 kg (209 lb 14.4 oz)    Examination:  General exam: Appears calm and comfortable  Respiratory system: Clear to auscultation. Respiratory effort normal. Cardiovascular system: S1 & S2 heard, RRR. No JVD, murmurs, rubs, gallops or clicks. No pedal edema. Gastrointestinal system: Abdomen is nondistended, soft and nontender. No organomegaly or masses felt. Normal bowel sounds heard. Central nervous system: Alert and oriented. No focal neurological deficits. Extremities: Symmetric 5 x 5 power. Left leg slightly larger than right leg Skin: No rashes, lesions or  ulcers Psychiatry: Judgement and insight appear normal. Mood & affect appropriate.     Data Reviewed: I have personally reviewed following labs and imaging studies  CBC:  Recent Labs Lab 08/10/16 1426 08/11/16 0516  WBC 12.3* 8.9  NEUTROABS 8.4*  --   HGB 12.8 11.2*  HCT 39.3 35.0*  MCV 86.2 87.3  PLT 253 218   Basic Metabolic Panel:  Recent Labs Lab 08/10/16 1426 08/11/16 0516  NA 140 139  K 3.9 3.6  CL 104 107  CO2 29 26  GLUCOSE 96 105*  BUN 12 13  CREATININE 0.66 0.62  CALCIUM 9.4 8.8*   GFR: Estimated Creatinine Clearance: 61.5 mL/min (by C-G formula based on SCr of 0.62 mg/dL). Liver Function Tests: No results for input(s): AST, ALT, ALKPHOS, BILITOT, PROT, ALBUMIN in the last 168 hours. No results for input(s): LIPASE, AMYLASE in the last 168 hours. No results for input(s): AMMONIA in the last 168 hours. Coagulation Profile: No results for input(s): INR, PROTIME in the last 168 hours. Cardiac Enzymes:  Recent Labs Lab 08/10/16 1426 08/10/16 1741  TROPONINI 0.03* <0.03   BNP (last 3 results) No results for input(s): PROBNP in the last 8760 hours. HbA1C: No results for input(s): HGBA1C in the last 72 hours. CBG: No results for input(s): GLUCAP in the last 168 hours. Lipid Profile: No results for input(s): CHOL, HDL, LDLCALC, TRIG, CHOLHDL, LDLDIRECT in the last 72 hours. Thyroid Function Tests:  Recent Labs  08/10/16 1426  TSH 0.545   Anemia Panel: No results for input(s): VITAMINB12, FOLATE, FERRITIN, TIBC, IRON, RETICCTPCT in the last 72 hours. Sepsis Labs: No results for input(s): PROCALCITON, LATICACIDVEN in the last 168 hours.  No results found for this or any previous visit (from the past 240 hour(s)).       Radiology Studies: Dg Chest 2 View  Result Date: 08/10/2016 CLINICAL DATA:  Chest pain. EXAM: CHEST  2 VIEW COMPARISON:  09/25/2011 . FINDINGS: Mediastinum hilar structures normal. Cardiomegaly. No focal pulmonary  infiltrate. No pleural effusion or pneumothorax. No acute bony abnormality. Degenerative changes thoracic spine . IMPRESSION: 1. Cardiomegaly.  No pulmonary venous congestion. 2.  No acute pulmonary disease. Electronically Signed   By: Maisie Fus  Register   On: 08/10/2016 14:53   Ct Angio Chest Pe W And/or Wo Contrast  Result Date: 08/10/2016 CLINICAL DATA:  Shortness of breath, chest pain, left leg pain for 3 months. Acute left popliteal DVT. EXAM: CT ANGIOGRAPHY CHEST WITH CONTRAST TECHNIQUE: Multidetector CT imaging of the chest was performed using the standard protocol during bolus administration of intravenous contrast. Multiplanar CT image reconstructions and MIPs were obtained to evaluate the vascular anatomy. CONTRAST:  100 cc Isovue 370 COMPARISON:  08/10/2016 FINDINGS: Cardiovascular: Thoracic aortic atherosclerosis noted without aneurysm, dissection, intramural hemorrhage,  or mediastinal hematoma. Major branch vessels appear patent. Three vessel arch anatomy noted. Pulmonary arteries are well visualized with contrast. Tiny peripheral right pulmonary artery filling defects in the right upper lobe, image 27, and in the right lower lobe, images 51 through 57. These are compatible with small right upper lobe and right lower lobe segmental pulmonary emboli. No significant left pulmonary embolus appreciated or central embolus. No evidence of heart strain by CT. RV/LV ratio 0.7 Normal heart size.  No pericardial effusion. Mediastinum/Nodes: No enlarged mediastinal, hilar, or axillary lymph nodes. Thyroid gland, trachea, and esophagus demonstrate no significant findings. Lungs/Pleura: Lungs are clear. No pleural effusion or pneumothorax. Upper Abdomen: Scattered varying size hypodense hepatic cysts. Largest posterior right hepatic dome cyst measures 5.6 cm, image 72. Mild adrenal thickening bilaterally, suspect hyperplasia. No other acute intra-abdominal process. Small calcified lymph node in the liver hilum  noted. Musculoskeletal: diffuse degenerative changes of the spine. No compression fracture. Sternum intact. Review of the MIP images confirms the above findings. IMPRESSION: Small acute peripheral right upper lobe and right lower lobe segmental pulmonary emboli. No significant central or proximal hilar embolus. Negative for heart strain. Thoracic aortic atherosclerosis Scattered hepatic cysts of varying size. These results will be called to the ordering clinician or representative by the Radiologist Assistant, and communication documented in the PACS or zVision Dashboard. Electronically Signed   By: Judie Petit.  Shick M.D.   On: 08/10/2016 18:37   US Venous Img Lower Unilateral Left  Result Date: 08/10/2016 CLINICAL DATA:  Left lower extremity edema and pain. EXAM: LEFT LOWER EXTREMITY VENOUS DOPPLER ULTRASOUND TECHNIQUE: Gray-scale sonography with graded compression, as well as color Doppler and duplex ultrasound were performed to evaluate the lower extremity deep venous systems from the level of the common femoral vein and including the common femoral, femoral, profunda femoral, popliteal and calf veins including the posterior tibial, peroneal and gastrocnemius veins when visible. The superficial great saphenous vein was also interrogated. Spectral Doppler was utilized to evaluate flow at rest and with distal augmentation maneuvers in the common femoral, femoral and popliteal veins. COMPARISON:  None. FINDINGS: Contralateral Common Femoral Vein: Respiratory phasicity is normal and symmetric with the symptomatic side. No evidence of thrombus. Normal compressibility. Common Femoral Vein: No evidence of thrombus. Normal compressibility, respiratory phasicity and response to augmentation. Saphenofemoral Junction: No evidence of thrombus. Normal compressibility and flow on color Doppler imaging. Profunda Femoral Vein: No evidence of thrombus. Normal compressibility and flow on color Doppler imaging. Femoral Vein: No  evidence of thrombus. Normal compressibility, respiratory phasicity and response to augmentation. Popliteal Vein: Left popliteal vein demonstrate intraluminal hypoechoic thrombus and is noncompressible. The popliteal thrombus appears occlusive. This involves the distal popliteal vein extending into the calf veins. Calf Veins: Popliteal thrombus appears to extend into the tibial and peroneal calf veins. Superficial Great Saphenous Vein: No evidence of thrombus. Normal compressibility and flow on color Doppler imaging. Venous Reflux:  None. Other Findings:  None. IMPRESSION: Acute occlusive distal left popliteal DVT extending into the tibial and peroneal calf veins. These results will be called to the ordering clinician or representative by the Radiologist Assistant, and communication documented in the PACS or zVision Dashboard. Electronically Signed   By: Judie Petit.  Shick M.D.   On: 08/10/2016 18:12        Scheduled Meds: . enoxaparin (LOVENOX) injection  100 mg Subcutaneous Q12H  . lisinopril  20 mg Oral Daily  . metoprolol succinate  12.5 mg Oral Daily   Continuous Infusions: . sodium  chloride 75 mL/hr at 08/10/16 2216     LOS: 0 days    Time spent: 30 minutes    Bennett Scrape, MD [facility] Pager [phone]  If 7PM-7AM, please contact night-coverage [email or web address] Password [facility] 08/11/2016, 2:39 PM

## 2016-08-11 NOTE — Care Management Note (Signed)
Case Management Note  Patient Details  Name: Diana [name] MRN: [id number] Date of Birth: 06/20/1934  Subjective/Objective:    Patient adm from home with PE and DVT. She is ind with ADL's, still drives. Reports no issues getting to appointments or affording medications. She is getting Lovenox injections.             Action/Plan: Will follow for possible need of HH services regarding Lovenox injections and co-pays.   Expected Discharge Date:       08/13/2016           Expected Discharge Plan:  Home w Home Health Services  In-House Referral:  NA  Discharge planning Services  CM Consult  Post Acute Care Choice:    Choice offered to:     DME Arranged:    DME Agency:     HH Arranged:    HH Agency:     Status of Service:  In process, will continue to follow  If discussed at Long Length of Stay Meetings, dates discussed:    Additional Comments:  Diana [name], Diana OilerSharley Diane, RN 08/11/2016, 11:23 AM

## 2016-08-12 DIAGNOSIS — I82402 Acute embolism and thrombosis of unspecified deep veins of left lower extremity: Secondary | ICD-10-CM

## 2016-08-12 DIAGNOSIS — N3001 Acute cystitis with hematuria: Secondary | ICD-10-CM

## 2016-08-12 LAB — URINALYSIS, ROUTINE W REFLEX MICROSCOPIC
Bilirubin Urine: NEGATIVE
Glucose, UA: NEGATIVE mg/dL
Ketones, ur: NEGATIVE mg/dL
Nitrite: NEGATIVE
PROTEIN: NEGATIVE mg/dL
Specific Gravity, Urine: <1.005 — ABNORMAL LOW (ref 1.005–1.030)
pH: 5.5 (ref 5.0–8.0)

## 2016-08-12 LAB — URINE MICROSCOPIC-ADD ON: SQUAMOUS EPITHELIAL / LPF: NONE SEEN

## 2016-08-12 LAB — CARDIOLIPIN ANTIBODIES, IGG, IGM, IGA: Anticardiolipin IgG: <9 GPL U/mL (ref 0–14)

## 2016-08-12 LAB — HOMOCYSTEINE: Homocysteine: 11.1 umol/L (ref 0.0–15.0)

## 2016-08-12 MED ORDER — RIVAROXABAN 15 MG PO TABS: 15.0000 mg | ORAL_TABLET | Freq: Two times a day (BID) | ORAL | 0 refills | Status: DC

## 2016-08-12 MED ORDER — NITROFURANTOIN MONOHYD MACRO 100 MG PO CAPS: 100.0000 mg | ORAL_CAPSULE | Freq: Two times a day (BID) | ORAL | 0 refills | Status: AC

## 2016-08-12 MED ORDER — RIVAROXABAN 15 MG PO TABS
15.0000 mg | ORAL_TABLET | Freq: Two times a day (BID) | ORAL | Status: DC
  Administered 2016-08-12: 15 mg via ORAL
  Filled 2016-08-12: qty 1

## 2016-08-12 MED ORDER — RIVAROXABAN (XARELTO) VTE STARTER PACK (15 & 20 MG): ORAL_TABLET | ORAL | 0 refills | Status: DC

## 2016-08-12 MED ORDER — ACETAMINOPHEN 325 MG PO TABS
650.0000 mg | ORAL_TABLET | Freq: Four times a day (QID) | ORAL | Status: DC | PRN
Start: 2016-08-12 — End: 2019-01-18

## 2016-08-12 MED ORDER — NITROFURANTOIN MONOHYD MACRO 100 MG PO CAPS
100.0000 mg | ORAL_CAPSULE | Freq: Two times a day (BID) | ORAL | Status: DC
  Administered 2016-08-12: 100 mg via ORAL
  Filled 2016-08-12 (×3): qty 1

## 2016-08-12 NOTE — Discharge Summary (Signed)
Physician Discharge Summary  Diana [name] WJX:[id number] DOB: 04/12/1934 DOA: 08/10/2016  PCP: Kirstie Peri, MD  Admit date: 08/10/2016 Discharge date: 08/12/2016  Admitted From: Home Disposition:  Home  Recommendations for Outpatient Follow-up:  1. Follow up with PCP in 1 weeks 2. Repeat UA in 1 week to ensure hematuria has cleared 3. Discuss follow up with an urologist  Home Health:No Equipment/Devices: No   Discharge Condition:Stable CODE STATUS:Full Code Diet recommendation: Heart Healthy  Brief/Interim Summary: Diana [name] a 82 y.o.femalewith medical history significant of HTN, hyperthyroidism, blood clot over 20 years ago presents to the Ed with SOB, chest discomfort, and left leg edema. Pt reports that she was getting up this morning when her leg started to hurt. She still has pain in her left leg. She also has been having intermittent CP for the past few days. She denies any recent surgery history. She did have a colonoscopy in July which several polyps were found.While in the ED, BMP unremarkable, troponin 0.03, WBC 12.3, CTA shows small acute peripheral right upper lobe and right lower lobe segmental pulmonary emboli. US of the LLE shows acute occlusive distal left popliteal DVT extending into the tibial and peroneal calf veins. She was admitted for treatment of her PE.  Patient decided on Xarelto for anticoagulation.  She mentioned at time of discharge that she felt increased frequency of urination.  She also reports some back discomfort.  Urinalysis showed small Hgb, few bacteria, large leukocytes.  She was started on macrobid and instructed to follow up with her PCP to ensure resolution of her UTI.  Discharge Diagnoses:  Active Problems:   Pulmonary embolism (HCC)   HTN (hypertension)    Discharge Instructions  Discharge Instructions    Call MD for:  difficulty breathing, headache or visual disturbances    Complete by:  As directed    Call MD for:   persistant nausea and vomiting    Complete by:  As directed    Call MD for:  severe uncontrolled pain    Complete by:  As directed    Call MD for:  temperature >100.4    Complete by:  As directed    Diet - low sodium heart healthy    Complete by:  As directed    Increase activity slowly    Complete by:  As directed        Medication List    TAKE these medications   acetaminophen 325 MG tablet Commonly known as:  TYLENOL Take 2 tablets (650 mg total) by mouth every 6 (six) hours as needed for mild pain (or Fever >/= 101).   aspirin EC 81 MG tablet Take 81 mg by mouth daily.   lisinopril 20 MG tablet Commonly known as:  PRINIVIL,ZESTRIL Take 20 mg by mouth daily.   meclizine 12.5 MG tablet Commonly known as:  ANTIVERT Take 12.5 mg by mouth 3 (three) times daily as needed for dizziness or nausea.   metoprolol succinate 25 MG 24 hr tablet Commonly known as:  TOPROL-XL Take 12.5 mg by mouth daily.   nitrofurantoin (macrocrystal-monohydrate) 100 MG capsule Commonly known as:  MACROBID Take 1 capsule (100 mg total) by mouth 2 (two) times daily.   Rivaroxaban 15 & 20 MG Tbpk Take as directed on package: Start with one 15mg  tablet by mouth twice a day with food. On Day 22, switch to one 20mg  tablet once a day with food.       No Known Allergies  Consultations:  None  Procedures/Studies: Dg Chest 2 View  Result Date: 08/10/2016 CLINICAL DATA:  Chest pain. EXAM: CHEST  2 VIEW COMPARISON:  09/25/2011 . FINDINGS: Mediastinum hilar structures normal. Cardiomegaly. No focal pulmonary infiltrate. No pleural effusion or pneumothorax. No acute bony abnormality. Degenerative changes thoracic spine . IMPRESSION: 1. Cardiomegaly.  No pulmonary venous congestion. 2.  No acute pulmonary disease. Electronically Signed   By: Maisie Fus  Register   On: 08/10/2016 14:53   Ct Angio Chest Pe W And/or Wo Contrast  Result Date: 08/10/2016 CLINICAL DATA:  Shortness of breath, chest pain, left  leg pain for 3 months. Acute left popliteal DVT. EXAM: CT ANGIOGRAPHY CHEST WITH CONTRAST TECHNIQUE: Multidetector CT imaging of the chest was performed using the standard protocol during bolus administration of intravenous contrast. Multiplanar CT image reconstructions and MIPs were obtained to evaluate the vascular anatomy. CONTRAST:  100 cc Isovue 370 COMPARISON:  08/10/2016 FINDINGS: Cardiovascular: Thoracic aortic atherosclerosis noted without aneurysm, dissection, intramural hemorrhage, or mediastinal hematoma. Major branch vessels appear patent. Three vessel arch anatomy noted. Pulmonary arteries are well visualized with contrast. Tiny peripheral right pulmonary artery filling defects in the right upper lobe, image 27, and in the right lower lobe, images 51 through 57. These are compatible with small right upper lobe and right lower lobe segmental pulmonary emboli. No significant left pulmonary embolus appreciated or central embolus. No evidence of heart strain by CT. RV/LV ratio 0.7 Normal heart size.  No pericardial effusion. Mediastinum/Nodes: No enlarged mediastinal, hilar, or axillary lymph nodes. Thyroid gland, trachea, and esophagus demonstrate no significant findings. Lungs/Pleura: Lungs are clear. No pleural effusion or pneumothorax. Upper Abdomen: Scattered varying size hypodense hepatic cysts. Largest posterior right hepatic dome cyst measures 5.6 cm, image 72. Mild adrenal thickening bilaterally, suspect hyperplasia. No other acute intra-abdominal process. Small calcified lymph node in the liver hilum noted. Musculoskeletal: diffuse degenerative changes of the spine. No compression fracture. Sternum intact. Review of the MIP images confirms the above findings. IMPRESSION: Small acute peripheral right upper lobe and right lower lobe segmental pulmonary emboli. No significant central or proximal hilar embolus. Negative for heart strain. Thoracic aortic atherosclerosis Scattered hepatic cysts of  varying size. These results will be called to the ordering clinician or representative by the Radiologist Assistant, and communication documented in the PACS or zVision Dashboard. Electronically Signed   By: Judie Petit.  Shick M.D.   On: 08/10/2016 18:37   US Venous Img Lower Unilateral Left  Result Date: 08/10/2016 CLINICAL DATA:  Left lower extremity edema and pain. EXAM: LEFT LOWER EXTREMITY VENOUS DOPPLER ULTRASOUND TECHNIQUE: Gray-scale sonography with graded compression, as well as color Doppler and duplex ultrasound were performed to evaluate the lower extremity deep venous systems from the level of the common femoral vein and including the common femoral, femoral, profunda femoral, popliteal and calf veins including the posterior tibial, peroneal and gastrocnemius veins when visible. The superficial great saphenous vein was also interrogated. Spectral Doppler was utilized to evaluate flow at rest and with distal augmentation maneuvers in the common femoral, femoral and popliteal veins. COMPARISON:  None. FINDINGS: Contralateral Common Femoral Vein: Respiratory phasicity is normal and symmetric with the symptomatic side. No evidence of thrombus. Normal compressibility. Common Femoral Vein: No evidence of thrombus. Normal compressibility, respiratory phasicity and response to augmentation. Saphenofemoral Junction: No evidence of thrombus. Normal compressibility and flow on color Doppler imaging. Profunda Femoral Vein: No evidence of thrombus. Normal compressibility and flow on color Doppler imaging. Femoral Vein: No evidence of thrombus. Normal compressibility, respiratory phasicity  and response to augmentation. Popliteal Vein: Left popliteal vein demonstrate intraluminal hypoechoic thrombus and is noncompressible. The popliteal thrombus appears occlusive. This involves the distal popliteal vein extending into the calf veins. Calf Veins: Popliteal thrombus appears to extend into the tibial and peroneal calf veins.  Superficial Great Saphenous Vein: No evidence of thrombus. Normal compressibility and flow on color Doppler imaging. Venous Reflux:  None. Other Findings:  None. IMPRESSION: Acute occlusive distal left popliteal DVT extending into the tibial and peroneal calf veins. These results will be called to the ordering clinician or representative by the Radiologist Assistant, and communication documented in the PACS or zVision Dashboard. Electronically Signed   By: Judie PetitM.  Shick M.D.   On: 08/10/2016 18:12      Subjective: Patient voices that overnight and this morning she had increase in the frequency of her urination.  Denies dysuria but states that she is struggling to hold it when she has to go.  Also reports that she frequently does have urinary tract infections.  UA showed signs of infection.  Patient decided on Xarelto for PE/ DVT treatment anticoagulation.  She was started on it today.  Patient was stable for discharge with instructions to follow up with PCP to discuss resolution of her UTI.  Discharge Exam: Vitals:   08/11/16 2149 08/12/16 0430  BP: 140/62 140/60  Pulse: 65 62  Resp: 20 18  Temp: 98.9 F (37.2 C) 97.8 F (36.6 C)   Vitals:   08/11/16 0400 08/11/16 1444 08/11/16 2149 08/12/16 0430  BP: (!) 131/53 (!) 141/69 140/62 140/60  Pulse: 74 70 65 62  Resp: 19 19 20 18   Temp: 98.6 F (37 C) 98.2 F (36.8 C) 98.9 F (37.2 C) 97.8 F (36.6 C)  TempSrc: Oral Oral Oral Oral  SpO2: 100% 100% 99% 99%  Weight:      Height:        General: Pt is alert, awake, not in acute distress Cardiovascular: RRR, S1/S2 +, no rubs, no gallops Respiratory: CTA bilaterally, no wheezing, no rhonchi Abdominal: Soft, NT, ND, bowel sounds +, no CVA tenderness Extremities: no edema, no cyanosis    The results of significant diagnostics from this hospitalization (including imaging, microbiology, ancillary and laboratory) are listed below for reference.     Microbiology: No results found for this  or any previous visit (from the past 240 hour(s)).   Labs: BNP (last 3 results)  Recent Labs  08/10/16 1426  BNP 17.0   Basic Metabolic Panel:  Recent Labs Lab 08/10/16 1426 08/11/16 0516  NA 140 139  K 3.9 3.6  CL 104 107  CO2 29 26  GLUCOSE 96 105*  BUN 12 13  CREATININE 0.66 0.62  CALCIUM 9.4 8.8*   Liver Function Tests: No results for input(s): AST, ALT, ALKPHOS, BILITOT, PROT, ALBUMIN in the last 168 hours. No results for input(s): LIPASE, AMYLASE in the last 168 hours. No results for input(s): AMMONIA in the last 168 hours. CBC:  Recent Labs Lab 08/10/16 1426 08/11/16 0516  WBC 12.3* 8.9  NEUTROABS 8.4*  --   HGB 12.8 11.2*  HCT 39.3 35.0*  MCV 86.2 87.3  PLT 253 218   Cardiac Enzymes:  Recent Labs Lab 08/10/16 1426 08/10/16 1741  TROPONINI 0.03* <0.03   BNP: Invalid input(s): POCBNP CBG: No results for input(s): GLUCAP in the last 168 hours. D-Dimer No results for input(s): DDIMER in the last 72 hours. Hgb A1c No results for input(s): HGBA1C in the last 72 hours.  Lipid Profile No results for input(s): CHOL, HDL, LDLCALC, TRIG, CHOLHDL, LDLDIRECT in the last 72 hours. Thyroid function studies  Recent Labs  08/10/16 1426  TSH 0.545   Anemia work up No results for input(s): VITAMINB12, FOLATE, FERRITIN, TIBC, IRON, RETICCTPCT in the last 72 hours. Urinalysis    Component Value Date/Time   COLORURINE YELLOW 08/12/2016 0858   APPEARANCEUR CLEAR 08/12/2016 0858   LABSPEC <1.005 (L) 08/12/2016 0858   PHURINE 5.5 08/12/2016 0858   GLUCOSEU NEGATIVE 08/12/2016 0858   HGBUR SMALL (A) 08/12/2016 0858   BILIRUBINUR NEGATIVE 08/12/2016 0858   KETONESUR NEGATIVE 08/12/2016 0858   PROTEINUR NEGATIVE 08/12/2016 0858   NITRITE NEGATIVE 08/12/2016 0858   LEUKOCYTESUR LARGE (A) 08/12/2016 0858   Sepsis Labs Invalid input(s): PROCALCITONIN,  WBC,  LACTICIDVEN Microbiology No results found for this or any previous visit (from the past 240  hour(s)).   Time coordinating discharge: Over 30 minutes  SIGNED:   Bennett Scrape, MD  [facility] 08/12/2016, 2:01 PM Pager [phone] If 7PM-7AM, please contact night-coverage [email or web address] Password [email or web address]

## 2016-08-12 NOTE — Care Management Note (Signed)
Case Management Note  Patient Details  Name: Diana [name] MRN: [id number] Date of Birth: 05/08/1934    Expected Discharge Date:       08/12/2016           Expected Discharge Plan:  Home w Home Health Services  In-House Referral:  NA  Discharge planning Services  CM Consult  Post Acute Care Choice:    Choice offered to:     DME Arranged:    DME Agency:     HH Arranged:    HH Agency:     Status of Service:  Completed, signed off  If discussed at [employer] Length of [employer] Meetings, dates discussed:    Additional Comments: Patient discharging today. Benefits check done for Xarelto, Pradaxa, and Eliquis.  Patient will discharge with Xarelto prescription. Will give a 30 day trial card. Patient will not need HH services.  Anson Peddie, [name], RN 08/12/2016, 2:04 PM

## 2016-08-12 NOTE — Discharge Instructions (Addendum)
Information on my medicine - XARELTO (rivaroxaban)  This medication education was reviewed with me or my healthcare representative as part of my discharge preparation.  The pharmacist that spoke with me during my hospital stay was:  Wayland DenisHall, Danilo Cappiello A, [facility]  WHY WAS XARELTO PRESCRIBED FOR YOU? Xarelto was prescribed to treat blood clots that may have been found in the veins of your legs (deep vein thrombosis) or in your lungs (pulmonary embolism) and to reduce the risk of them occurring again.  What do you need to know about Xarelto? The starting dose is one 15 mg tablet taken TWICE daily with food for the FIRST 21 DAYS then on (enter date)  09/02/16  the dose is changed to one 20 mg tablet taken ONCE A DAY with your evening meal.  DO NOT stop taking Xarelto without talking to the health care provider who prescribed the medication.  Refill your prescription for 20 mg tablets before you run out.  After discharge, you should have regular check-up appointments with your healthcare provider that is prescribing your Xarelto.  In the future your dose may need to be changed if your kidney function changes by a significant amount.  What do you do if you miss a dose? If you are taking Xarelto TWICE DAILY and you miss a dose, take it as soon as you remember. You may take two 15 mg tablets (total 30 mg) at the same time then resume your regularly scheduled 15 mg twice daily the next day.  If you are taking Xarelto ONCE DAILY and you miss a dose, take it as soon as you remember on the same day then continue your regularly scheduled once daily regimen the next day. Do not take two doses of Xarelto at the same time.   Important Safety Information Xarelto is a blood thinner medicine that can cause bleeding. You should call your healthcare provider right away if you experience any of the following: ? Bleeding from an injury or your nose that does not stop. ? Unusual colored urine (red or dark brown) or  unusual colored stools (red or black). ? Unusual bruising for unknown reasons. ? A serious fall or if you hit your head (even if there is no bleeding).  Some medicines may interact with Xarelto and might increase your risk of bleeding while on Xarelto. To help avoid this, consult your healthcare provider or pharmacist prior to using any new prescription or non-prescription medications, including herbals, vitamins, non-steroidal anti-inflammatory drugs (NSAIDs) and supplements.  This website has more information on Xarelto: [email or web address].

## 2016-08-12 NOTE — Progress Notes (Signed)
IV discontinued. Pt. Discharged home with written instructions and prescriptions.

## 2016-08-13 LAB — LUPUS ANTICOAGULANT PANEL
DRVVT: 45.3 s (ref 0.0–47.0)
PTT Lupus Anticoagulant: 52.2 s — ABNORMAL HIGH (ref 0.0–51.9)

## 2016-08-13 LAB — PTT-LA INCUB MIX: PTT-LA Incub Mix: 45 s (ref 0.0–48.9)

## 2016-08-13 LAB — PTT-LA MIX: PTT-LA MIX: 43.4 s (ref 0.0–48.9)

## 2016-08-16 LAB — FACTOR 5 LEIDEN

## 2016-08-17 LAB — PROTHROMBIN GENE MUTATION

## 2016-08-18 ENCOUNTER — Encounter: Payer: Self-pay | Admitting: Surgery

## 2016-08-25 ENCOUNTER — Encounter: Payer: Self-pay | Admitting: Surgery

## 2016-08-25 ENCOUNTER — Ambulatory Visit (INDEPENDENT_AMBULATORY_CARE_PROVIDER_SITE_OTHER): Payer: PPO | Admitting: Surgery

## 2016-08-25 VITALS — BP 155/97 | HR 77 | Temp 97.5°F | Resp 16 | Ht 64.0 in | Wt 211.0 lb

## 2016-08-25 DIAGNOSIS — I872 Venous insufficiency (chronic) (peripheral): Secondary | ICD-10-CM | POA: Diagnosis not present

## 2016-08-25 NOTE — Progress Notes (Signed)
[facility]  Patient name: Diana [name] MRN: [id number] DOB: 07/08/1934 Sex: female  REFERRING PHYSICIAN: Dr. Sherryll Burger  REASON FOR CONSULT: leg pain  HPI: Diana [name] is a 82 y.o. female, who is referred today for evaluation of leg pain.  This has had a gradual onset.  She describes getting cramps and pain take her rest when lying flat and at night.  It mainly involves the left and right thigh.  There are no precipitating factors are there are no relieving factors.  She describes this as an aching feeling in that her legs feel tight.  She has worn 15-20 compression stockings.  She thinks these may make a difference.  Patient was recently in the hospital secondary to pulmonary embolism.  She had an occlusive distal left popliteal DVT which extended into the tibial vessels.  She was started on Xaralto.  She is also recently been treated for a urinary tract infection.  Patient has a significant medical history for hypertension which is managed with an ACE inhibitor.  She is a former smoker.  Past Medical History:  Diagnosis Date  . Enlarged heart   . Hypertension   . Hyperthyroidism     No family history on file.  SOCIAL HISTORY: Social History   Social History  . Marital status: Married    Spouse name: N/A  . Number of children: N/A  . Years of education: N/A   Occupational History  . Not on file.   Social History Main Topics  . Smoking status: Former Games developer  . Smokeless tobacco: Never Used  . Alcohol use No  . Drug use: No  . Sexual activity: Not on file   Other Topics Concern  . Not on file   Social History Narrative  . No narrative on file    No Known Allergies  Current Outpatient Prescriptions  Medication Sig Dispense Refill  . acetaminophen (TYLENOL) 325 MG tablet Take 2 tablets (650 mg total) by mouth every 6 (six) hours as needed for mild pain (or Fever >/= 101).    Marland Kitchen aspirin EC 81  MG tablet Take 81 mg by mouth daily.    Marland Kitchen lisinopril (PRINIVIL,ZESTRIL) 20 MG tablet Take 20 mg by mouth daily.     . meclizine (ANTIVERT) 12.5 MG tablet Take 12.5 mg by mouth 3 (three) times daily as needed for dizziness or nausea.     . metoprolol succinate (TOPROL-XL) 25 MG 24 hr tablet Take 12.5 mg by mouth daily.    . Rivaroxaban 15 & 20 MG TBPK Take as directed on package: Start with one 15mg  tablet by mouth twice a day with food. On Day 22, switch to one 20mg  tablet once a day with food. 51 each 0   No current facility-administered medications for this visit.     REVIEW OF SYSTEMS:  [X]  denotes positive finding, [ ]  denotes negative finding Cardiac  Comments:  Chest pain or chest pressure:    Shortness of breath upon exertion: x   Short of breath when lying flat:    Irregular heart rhythm: x       Vascular    Pain in calf, thigh, or hip brought on by ambulation: x   Pain in feet at night that wakes you up from your sleep:  x   Blood clot in your veins:    Leg swelling:  x       Pulmonary    Oxygen at home:    Productive cough:  Wheezing:         Neurologic    Sudden weakness in arms or legs:  x   Sudden numbness in arms or legs:  x   Sudden onset of difficulty speaking or slurred speech:    Temporary loss of vision in one eye:     Problems with dizziness:  x       Gastrointestinal    Blood in stool:     Vomited blood:         Genitourinary    Burning when urinating:     Blood in urine:        Psychiatric    Major depression:         Hematologic    Bleeding problems:    Problems with blood clotting too easily:        Skin    Rashes or ulcers:        Constitutional    Fever or chills:      PHYSICAL EXAM: Vitals:   08/25/16 1558  BP: (!) 155/97  Pulse: 77  Resp: 16  Temp: 97.5 F (36.4 C)  TempSrc: Oral  SpO2: 98%  Weight: 211 lb (95.7 kg)  Height: 5\' 4"  (1.626 m)    GENERAL: The patient is a well-nourished female, in no acute distress.  The vital signs are documented above. CARDIAC: There is a regular rate and rhythm.  VASCULAR: Palpable pedal pulses 1+ pitting edema bilaterally with no carotid bruits. PULMONARY: There is good air exchange bilaterally without wheezing or rales.  MUSCULOSKELETAL: There are no major deformities or cyanosis. NEUROLOGIC: No focal weakness or paresthesias are detected. SKIN: There are no ulcers or rashes noted. PSYCHIATRIC: The patient has a normal affect.  DATA:  I have reviewed her outside duplex which reveals an ankle-brachial index of 0.94 on the right and 0.88 on the left.  No significant plaque disease was noted however ABIs were borderline for obstructive disease.  ASSESSMENT AND PLAN: Leg pain: Based on her ultrasound studies as well as the fact she has palpable pedal pulses, I do not think that her complaints are secondary to arterial insufficiency.  I do however feel that the swelling is contributing to her symptoms.  I suspect that she has lymphedema or chronic venous insufficiency.  Regardless, I think she would benefit from compression stockings.  I have given her information about wearing 20-30 thigh-high compression stockings.  She will try this over the course of the next 6 weeks and return to my office at that time for a venous insufficiency ultrasound.  The patient is also interested in exploring getting an MRI to see if this is degenerative back disease.  I feel like that is reasonable.   Durene CalWells Raeshaun Simson, MD [facility] Tel [phone] Pager [phone]

## 2016-08-27 NOTE — Addendum Note (Signed)
Addended by: Yolonda KidaEVANS, Joei Frangos N on: 08/27/2016 02:02 PM   Modules accepted: Orders

## 2016-09-13 DIAGNOSIS — E7800 Pure hypercholesterolemia, unspecified: Secondary | ICD-10-CM | POA: Diagnosis not present

## 2016-09-13 DIAGNOSIS — Z299 Encounter for prophylactic measures, unspecified: Secondary | ICD-10-CM | POA: Diagnosis not present

## 2016-09-13 DIAGNOSIS — I2699 Other pulmonary embolism without acute cor pulmonale: Secondary | ICD-10-CM | POA: Diagnosis not present

## 2016-09-13 DIAGNOSIS — R350 Frequency of micturition: Secondary | ICD-10-CM | POA: Diagnosis not present

## 2016-09-13 DIAGNOSIS — N390 Urinary tract infection, site not specified: Secondary | ICD-10-CM | POA: Diagnosis not present

## 2016-09-13 DIAGNOSIS — Z6839 Body mass index (BMI) 39.0-39.9, adult: Secondary | ICD-10-CM | POA: Diagnosis not present

## 2016-10-04 ENCOUNTER — Encounter (HOSPITAL_COMMUNITY): Payer: PPO

## 2016-10-06 ENCOUNTER — Encounter: Payer: Self-pay | Admitting: Surgery

## 2016-10-11 ENCOUNTER — Ambulatory Visit (HOSPITAL_COMMUNITY)
Admission: RE | Admit: 2016-10-11 | Discharge: 2016-10-11 | Disposition: A | Discharge status: Home / Self Care | Payer: PPO | Source: Ambulatory Visit | Attending: Surgery | Admitting: Surgery

## 2016-10-11 ENCOUNTER — Ambulatory Visit: Payer: PPO | Admitting: Surgery

## 2016-10-11 DIAGNOSIS — I872 Venous insufficiency (chronic) (peripheral): Secondary | ICD-10-CM | POA: Insufficient documentation

## 2016-10-18 ENCOUNTER — Ambulatory Visit (INDEPENDENT_AMBULATORY_CARE_PROVIDER_SITE_OTHER): Payer: PPO | Admitting: Surgery

## 2016-10-18 ENCOUNTER — Encounter: Payer: Self-pay | Admitting: Surgery

## 2016-10-18 VITALS — BP 170/60 | HR 87 | Temp 97.1°F | Resp 18 | Ht 64.0 in | Wt 211.5 lb

## 2016-10-18 DIAGNOSIS — I872 Venous insufficiency (chronic) (peripheral): Secondary | ICD-10-CM | POA: Diagnosis not present

## 2016-10-18 NOTE — Progress Notes (Signed)
[facility]  Patient name: Diana [name] MRN: [id number] DOB: 07/11/1934 Sex: female  REASON FOR VISIT: follow up  HPI: Diana [name] is a 82 y.o. female returns today for follow up.  SHe initially presented with DVT and PE.  I placed her in compression stockings to see if she had any benefit.  She is back today for follow-up.  She states that her leg pain is a little better with the stockings although they're difficult to put on.  Past Medical History:  Diagnosis Date  . Enlarged heart   . Hypertension   . Hyperthyroidism     History reviewed. No pertinent family history.  SOCIAL HISTORY: Social History  Substance Use Topics  . Smoking status: Former Games developermoker  . Smokeless tobacco: Never Used  . Alcohol use No    No Known Allergies  Current Outpatient Prescriptions  Medication Sig Dispense Refill  . acetaminophen (TYLENOL) 325 MG tablet Take 2 tablets (650 mg total) by mouth every 6 (six) hours as needed for mild pain (or Fever >/= 101).    Marland Kitchen. aspirin EC 81 MG tablet Take 81 mg by mouth daily.    Marland Kitchen. lisinopril (PRINIVIL,ZESTRIL) 20 MG tablet Take 20 mg by mouth daily.     . meclizine (ANTIVERT) 12.5 MG tablet Take 12.5 mg by mouth 3 (three) times daily as needed for dizziness or nausea.     . metoprolol succinate (TOPROL-XL) 25 MG 24 hr tablet Take 12.5 mg by mouth daily.    . Rivaroxaban 15 & 20 MG TBPK Take as directed on package: Start with one 15mg  tablet by mouth twice a day with food. On Day 22, switch to one 20mg  tablet once a day with food. 51 each 0   No current facility-administered medications for this visit.     REVIEW OF SYSTEMS:  [X]  denotes positive finding, [ ]  denotes negative finding Cardiac  Comments:  Chest pain or chest pressure:    Shortness of breath upon exertion:    Short of breath when lying flat:    Irregular heart rhythm:        Vascular    Pain in calf, thigh, or  hip brought on by ambulation:    Pain in feet at night that wakes you up from your sleep:     Blood clot in your veins:    Leg swelling:         Pulmonary    Oxygen at home:    Productive cough:     Wheezing:         Neurologic    Sudden weakness in arms or legs:     Sudden numbness in arms or legs:     Sudden onset of difficulty speaking or slurred speech:    Temporary loss of vision in one eye:     Problems with dizziness:         Gastrointestinal    Blood in stool:     Vomited blood:         Genitourinary    Burning when urinating:     Blood in urine:        Psychiatric    Major depression:         Hematologic    Bleeding problems:    Problems with blood clotting too easily:        Skin    Rashes or ulcers:        Constitutional    Fever or chills:  PHYSICAL EXAM: Vitals:   10/18/16 1113  BP: (!) 170/60  Pulse: 87  Resp: 18  Temp: 97.1 F (36.2 C)  TempSrc: Oral  SpO2: 97%  Weight: 211 lb 8 oz (95.9 kg)  Height: 5\' 4"  (1.626 m)    GENERAL: The patient is a well-nourished female, in no acute distress. The vital signs are documented above. CARDIAC: There is a regular rate and rhythm.  VASCULAR: One plus pitting edema bilateral lower extremity, right greater than left PULMONARY: There is good air exchange bilaterally without wheezing or rales. MUSCULOSKELETAL: There are no major deformities or cyanosis. NEUROLOGIC: No focal weakness or paresthesias are detected. SKIN: There are no ulcers or rashes noted. PSYCHIATRIC: The patient has a normal affect.  DATA:  I have reviewed her venous insufficiency ultrasound which shows a chronic left popliteal DVT.  She has reflux within the left popliteal vein and saphenofemoral junction, as well as the right great saphenous vein proximally, the right common femoral vein, and the right saphenofemoral junction  MEDICAL ISSUES: Chronic venous insufficiency: The ultrasound findings today, the patient does not have  reflux that could be intervened on within the superficial venous system.  Therefore, I stressed the importance of compression stockings.  I offered her referral to a lymphedema clinic, however she declined.  Other than compression stockings, no vascular intervention is recommended at this time.  The patient will follow up on an as-needed basis.    Durene CalWells Guinevere Stephenson, MD [facility] Tel [phone] Pager [phone]

## 2016-11-01 ENCOUNTER — Encounter (HOSPITAL_COMMUNITY): Payer: PPO

## 2016-11-01 ENCOUNTER — Ambulatory Visit: Payer: PPO | Admitting: Surgery

## 2016-11-09 DIAGNOSIS — R3915 Urgency of urination: Secondary | ICD-10-CM | POA: Diagnosis not present

## 2016-11-09 DIAGNOSIS — N3941 Urge incontinence: Secondary | ICD-10-CM | POA: Diagnosis not present

## 2016-11-24 DIAGNOSIS — Z87891 Personal history of nicotine dependence: Secondary | ICD-10-CM | POA: Diagnosis not present

## 2016-11-24 DIAGNOSIS — I739 Peripheral vascular disease, unspecified: Secondary | ICD-10-CM | POA: Diagnosis not present

## 2016-11-24 DIAGNOSIS — M79609 Pain in unspecified limb: Secondary | ICD-10-CM | POA: Diagnosis not present

## 2016-11-24 DIAGNOSIS — Z299 Encounter for prophylactic measures, unspecified: Secondary | ICD-10-CM | POA: Diagnosis not present

## 2016-11-24 DIAGNOSIS — I82409 Acute embolism and thrombosis of unspecified deep veins of unspecified lower extremity: Secondary | ICD-10-CM | POA: Diagnosis not present

## 2016-11-24 DIAGNOSIS — Z6839 Body mass index (BMI) 39.0-39.9, adult: Secondary | ICD-10-CM | POA: Diagnosis not present

## 2016-11-24 DIAGNOSIS — I2699 Other pulmonary embolism without acute cor pulmonale: Secondary | ICD-10-CM | POA: Diagnosis not present

## 2016-12-27 DIAGNOSIS — N3941 Urge incontinence: Secondary | ICD-10-CM | POA: Diagnosis not present

## 2016-12-27 DIAGNOSIS — R3915 Urgency of urination: Secondary | ICD-10-CM | POA: Diagnosis not present

## 2016-12-27 DIAGNOSIS — N905 Atrophy of vulva: Secondary | ICD-10-CM | POA: Diagnosis not present

## 2017-02-17 DIAGNOSIS — M4727 Other spondylosis with radiculopathy, lumbosacral region: Secondary | ICD-10-CM | POA: Diagnosis not present

## 2017-02-17 DIAGNOSIS — M5416 Radiculopathy, lumbar region: Secondary | ICD-10-CM | POA: Diagnosis not present

## 2017-02-17 DIAGNOSIS — M4317 Spondylolisthesis, lumbosacral region: Secondary | ICD-10-CM | POA: Diagnosis not present

## 2017-02-17 DIAGNOSIS — M549 Dorsalgia, unspecified: Secondary | ICD-10-CM | POA: Diagnosis not present

## 2017-02-28 DIAGNOSIS — N390 Urinary tract infection, site not specified: Secondary | ICD-10-CM | POA: Diagnosis not present

## 2017-02-28 DIAGNOSIS — I10 Essential (primary) hypertension: Secondary | ICD-10-CM | POA: Diagnosis not present

## 2017-02-28 DIAGNOSIS — R300 Dysuria: Secondary | ICD-10-CM | POA: Diagnosis not present

## 2017-02-28 DIAGNOSIS — Z299 Encounter for prophylactic measures, unspecified: Secondary | ICD-10-CM | POA: Diagnosis not present

## 2017-02-28 DIAGNOSIS — E7800 Pure hypercholesterolemia, unspecified: Secondary | ICD-10-CM | POA: Diagnosis not present

## 2017-02-28 DIAGNOSIS — J069 Acute upper respiratory infection, unspecified: Secondary | ICD-10-CM | POA: Diagnosis not present

## 2017-02-28 DIAGNOSIS — Z713 Dietary counseling and surveillance: Secondary | ICD-10-CM | POA: Diagnosis not present

## 2017-02-28 DIAGNOSIS — I82409 Acute embolism and thrombosis of unspecified deep veins of unspecified lower extremity: Secondary | ICD-10-CM | POA: Diagnosis not present

## 2017-02-28 DIAGNOSIS — Z6838 Body mass index (BMI) 38.0-38.9, adult: Secondary | ICD-10-CM | POA: Diagnosis not present

## 2017-03-03 DIAGNOSIS — M4317 Spondylolisthesis, lumbosacral region: Secondary | ICD-10-CM | POA: Diagnosis not present

## 2017-03-03 DIAGNOSIS — M545 Low back pain: Secondary | ICD-10-CM | POA: Diagnosis not present

## 2017-03-24 DIAGNOSIS — M545 Low back pain: Secondary | ICD-10-CM | POA: Diagnosis not present

## 2017-03-24 DIAGNOSIS — M4317 Spondylolisthesis, lumbosacral region: Secondary | ICD-10-CM | POA: Diagnosis not present

## 2017-04-12 DIAGNOSIS — I2699 Other pulmonary embolism without acute cor pulmonale: Secondary | ICD-10-CM | POA: Diagnosis not present

## 2017-04-12 DIAGNOSIS — E7800 Pure hypercholesterolemia, unspecified: Secondary | ICD-10-CM | POA: Diagnosis not present

## 2017-04-12 DIAGNOSIS — R350 Frequency of micturition: Secondary | ICD-10-CM | POA: Diagnosis not present

## 2017-04-12 DIAGNOSIS — K219 Gastro-esophageal reflux disease without esophagitis: Secondary | ICD-10-CM | POA: Diagnosis not present

## 2017-04-12 DIAGNOSIS — I491 Atrial premature depolarization: Secondary | ICD-10-CM | POA: Diagnosis not present

## 2017-04-12 DIAGNOSIS — Z299 Encounter for prophylactic measures, unspecified: Secondary | ICD-10-CM | POA: Diagnosis not present

## 2017-04-12 DIAGNOSIS — I82409 Acute embolism and thrombosis of unspecified deep veins of unspecified lower extremity: Secondary | ICD-10-CM | POA: Diagnosis not present

## 2017-04-12 DIAGNOSIS — N390 Urinary tract infection, site not specified: Secondary | ICD-10-CM | POA: Diagnosis not present

## 2017-04-12 DIAGNOSIS — R42 Dizziness and giddiness: Secondary | ICD-10-CM | POA: Diagnosis not present

## 2017-04-12 DIAGNOSIS — Z6837 Body mass index (BMI) 37.0-37.9, adult: Secondary | ICD-10-CM | POA: Diagnosis not present

## 2017-04-12 DIAGNOSIS — G4700 Insomnia, unspecified: Secondary | ICD-10-CM | POA: Diagnosis not present

## 2017-05-09 DIAGNOSIS — Z961 Presence of intraocular lens: Secondary | ICD-10-CM | POA: Diagnosis not present

## 2017-05-09 DIAGNOSIS — H04203 Unspecified epiphora, bilateral lacrimal glands: Secondary | ICD-10-CM | POA: Diagnosis not present

## 2017-05-09 DIAGNOSIS — H16223 Keratoconjunctivitis sicca, not specified as Sjogren's, bilateral: Secondary | ICD-10-CM | POA: Diagnosis not present

## 2017-05-09 DIAGNOSIS — H18419 Arcus senilis, unspecified eye: Secondary | ICD-10-CM | POA: Diagnosis not present

## 2017-05-09 DIAGNOSIS — Z9849 Cataract extraction status, unspecified eye: Secondary | ICD-10-CM | POA: Diagnosis not present

## 2017-05-09 DIAGNOSIS — H40023 Open angle with borderline findings, high risk, bilateral: Secondary | ICD-10-CM | POA: Diagnosis not present

## 2017-05-09 DIAGNOSIS — H353132 Nonexudative age-related macular degeneration, bilateral, intermediate dry stage: Secondary | ICD-10-CM | POA: Diagnosis not present

## 2017-05-09 DIAGNOSIS — H524 Presbyopia: Secondary | ICD-10-CM | POA: Diagnosis not present

## 2017-05-09 DIAGNOSIS — H04123 Dry eye syndrome of bilateral lacrimal glands: Secondary | ICD-10-CM | POA: Diagnosis not present

## 2017-05-09 DIAGNOSIS — H3540 Unspecified peripheral retinal degeneration: Secondary | ICD-10-CM | POA: Diagnosis not present

## 2017-05-24 DIAGNOSIS — I739 Peripheral vascular disease, unspecified: Secondary | ICD-10-CM | POA: Diagnosis not present

## 2017-05-24 DIAGNOSIS — Z6838 Body mass index (BMI) 38.0-38.9, adult: Secondary | ICD-10-CM | POA: Diagnosis not present

## 2017-05-24 DIAGNOSIS — E7800 Pure hypercholesterolemia, unspecified: Secondary | ICD-10-CM | POA: Diagnosis not present

## 2017-05-24 DIAGNOSIS — I2699 Other pulmonary embolism without acute cor pulmonale: Secondary | ICD-10-CM | POA: Diagnosis not present

## 2017-05-24 DIAGNOSIS — I82409 Acute embolism and thrombosis of unspecified deep veins of unspecified lower extremity: Secondary | ICD-10-CM | POA: Diagnosis not present

## 2017-05-24 DIAGNOSIS — N390 Urinary tract infection, site not specified: Secondary | ICD-10-CM | POA: Diagnosis not present

## 2017-05-24 DIAGNOSIS — R350 Frequency of micturition: Secondary | ICD-10-CM | POA: Diagnosis not present

## 2017-05-24 DIAGNOSIS — Z299 Encounter for prophylactic measures, unspecified: Secondary | ICD-10-CM | POA: Diagnosis not present

## 2017-06-06 DIAGNOSIS — I82409 Acute embolism and thrombosis of unspecified deep veins of unspecified lower extremity: Secondary | ICD-10-CM | POA: Diagnosis not present

## 2017-06-06 DIAGNOSIS — Z6838 Body mass index (BMI) 38.0-38.9, adult: Secondary | ICD-10-CM | POA: Diagnosis not present

## 2017-06-06 DIAGNOSIS — Z299 Encounter for prophylactic measures, unspecified: Secondary | ICD-10-CM | POA: Diagnosis not present

## 2017-06-06 DIAGNOSIS — I2699 Other pulmonary embolism without acute cor pulmonale: Secondary | ICD-10-CM | POA: Diagnosis not present

## 2017-06-06 DIAGNOSIS — I739 Peripheral vascular disease, unspecified: Secondary | ICD-10-CM | POA: Diagnosis not present

## 2017-06-06 DIAGNOSIS — I10 Essential (primary) hypertension: Secondary | ICD-10-CM | POA: Diagnosis not present

## 2017-06-06 DIAGNOSIS — E7800 Pure hypercholesterolemia, unspecified: Secondary | ICD-10-CM | POA: Diagnosis not present

## 2017-06-06 DIAGNOSIS — Z713 Dietary counseling and surveillance: Secondary | ICD-10-CM | POA: Diagnosis not present

## 2017-06-21 DIAGNOSIS — Z9849 Cataract extraction status, unspecified eye: Secondary | ICD-10-CM | POA: Diagnosis not present

## 2017-06-21 DIAGNOSIS — H40023 Open angle with borderline findings, high risk, bilateral: Secondary | ICD-10-CM | POA: Diagnosis not present

## 2017-06-21 DIAGNOSIS — Z83511 Family history of glaucoma: Secondary | ICD-10-CM | POA: Diagnosis not present

## 2017-06-21 DIAGNOSIS — Z961 Presence of intraocular lens: Secondary | ICD-10-CM | POA: Diagnosis not present

## 2017-06-21 DIAGNOSIS — H40053 Ocular hypertension, bilateral: Secondary | ICD-10-CM | POA: Diagnosis not present

## 2017-06-21 DIAGNOSIS — H18419 Arcus senilis, unspecified eye: Secondary | ICD-10-CM | POA: Diagnosis not present

## 2017-07-14 DIAGNOSIS — E7800 Pure hypercholesterolemia, unspecified: Secondary | ICD-10-CM | POA: Diagnosis not present

## 2017-07-14 DIAGNOSIS — I10 Essential (primary) hypertension: Secondary | ICD-10-CM | POA: Diagnosis not present

## 2017-07-14 DIAGNOSIS — Z79899 Other long term (current) drug therapy: Secondary | ICD-10-CM | POA: Diagnosis not present

## 2017-07-14 DIAGNOSIS — R5383 Other fatigue: Secondary | ICD-10-CM | POA: Diagnosis not present

## 2017-07-14 DIAGNOSIS — I493 Ventricular premature depolarization: Secondary | ICD-10-CM | POA: Diagnosis not present

## 2017-07-14 DIAGNOSIS — Z0000 Encounter for general adult medical examination without abnormal findings: Secondary | ICD-10-CM | POA: Diagnosis not present

## 2017-07-14 DIAGNOSIS — R350 Frequency of micturition: Secondary | ICD-10-CM | POA: Diagnosis not present

## 2017-07-14 DIAGNOSIS — Z299 Encounter for prophylactic measures, unspecified: Secondary | ICD-10-CM | POA: Diagnosis not present

## 2017-07-14 DIAGNOSIS — I739 Peripheral vascular disease, unspecified: Secondary | ICD-10-CM | POA: Diagnosis not present

## 2017-07-14 DIAGNOSIS — Z6838 Body mass index (BMI) 38.0-38.9, adult: Secondary | ICD-10-CM | POA: Diagnosis not present

## 2017-07-14 DIAGNOSIS — I2699 Other pulmonary embolism without acute cor pulmonale: Secondary | ICD-10-CM | POA: Diagnosis not present

## 2017-07-14 DIAGNOSIS — Z1211 Encounter for screening for malignant neoplasm of colon: Secondary | ICD-10-CM | POA: Diagnosis not present

## 2017-07-14 DIAGNOSIS — Z1389 Encounter for screening for other disorder: Secondary | ICD-10-CM | POA: Diagnosis not present

## 2017-07-14 DIAGNOSIS — Z7189 Other specified counseling: Secondary | ICD-10-CM | POA: Diagnosis not present

## 2017-08-02 DIAGNOSIS — Z961 Presence of intraocular lens: Secondary | ICD-10-CM | POA: Diagnosis not present

## 2017-08-02 DIAGNOSIS — H18419 Arcus senilis, unspecified eye: Secondary | ICD-10-CM | POA: Diagnosis not present

## 2017-08-02 DIAGNOSIS — H40053 Ocular hypertension, bilateral: Secondary | ICD-10-CM | POA: Diagnosis not present

## 2017-08-02 DIAGNOSIS — Z9849 Cataract extraction status, unspecified eye: Secondary | ICD-10-CM | POA: Diagnosis not present

## 2017-08-02 DIAGNOSIS — H40023 Open angle with borderline findings, high risk, bilateral: Secondary | ICD-10-CM | POA: Diagnosis not present

## 2017-08-10 ENCOUNTER — Emergency Department (HOSPITAL_COMMUNITY): Payer: PPO

## 2017-08-10 ENCOUNTER — Encounter (HOSPITAL_COMMUNITY): Payer: Self-pay

## 2017-08-10 ENCOUNTER — Observation Stay (HOSPITAL_COMMUNITY)
Admission: EM | Admit: 2017-08-10 | Discharge: 2017-08-11 | Disposition: A | Discharge status: Home / Self Care | Payer: PPO | Attending: Internal Medicine | Admitting: Internal Medicine

## 2017-08-10 DIAGNOSIS — M79606 Pain in leg, unspecified: Secondary | ICD-10-CM

## 2017-08-10 DIAGNOSIS — M79605 Pain in left leg: Secondary | ICD-10-CM | POA: Diagnosis not present

## 2017-08-10 DIAGNOSIS — Z7982 Long term (current) use of aspirin: Secondary | ICD-10-CM | POA: Insufficient documentation

## 2017-08-10 DIAGNOSIS — M79604 Pain in right leg: Secondary | ICD-10-CM | POA: Diagnosis not present

## 2017-08-10 DIAGNOSIS — R7989 Other specified abnormal findings of blood chemistry: Secondary | ICD-10-CM | POA: Diagnosis not present

## 2017-08-10 DIAGNOSIS — R112 Nausea with vomiting, unspecified: Secondary | ICD-10-CM

## 2017-08-10 DIAGNOSIS — R778 Other specified abnormalities of plasma proteins: Secondary | ICD-10-CM | POA: Diagnosis present

## 2017-08-10 DIAGNOSIS — R55 Syncope and collapse: Secondary | ICD-10-CM | POA: Diagnosis not present

## 2017-08-10 DIAGNOSIS — R748 Abnormal levels of other serum enzymes: Secondary | ICD-10-CM | POA: Diagnosis not present

## 2017-08-10 DIAGNOSIS — R531 Weakness: Secondary | ICD-10-CM | POA: Diagnosis not present

## 2017-08-10 DIAGNOSIS — R0602 Shortness of breath: Secondary | ICD-10-CM | POA: Insufficient documentation

## 2017-08-10 DIAGNOSIS — Z87891 Personal history of nicotine dependence: Secondary | ICD-10-CM | POA: Insufficient documentation

## 2017-08-10 DIAGNOSIS — I10 Essential (primary) hypertension: Secondary | ICD-10-CM | POA: Insufficient documentation

## 2017-08-10 DIAGNOSIS — Z79899 Other long term (current) drug therapy: Secondary | ICD-10-CM | POA: Insufficient documentation

## 2017-08-10 HISTORY — DX: Acute embolism and thrombosis of unspecified deep veins of unspecified lower extremity: I82.409

## 2017-08-10 HISTORY — DX: Other pulmonary embolism without acute cor pulmonale: I26.99

## 2017-08-10 HISTORY — DX: Venous insufficiency (chronic) (peripheral): I87.2

## 2017-08-10 HISTORY — DX: Chronic embolism and thrombosis of left popliteal vein: I82.532

## 2017-08-10 LAB — CBC WITH DIFFERENTIAL/PLATELET
BASOS ABS: 0 10*3/uL (ref 0.0–0.1)
BASOS PCT: 0 %
Eosinophils Absolute: 0.1 10*3/uL (ref 0.0–0.7)
Eosinophils Relative: 1 %
HCT: 39.8 % (ref 36.0–46.0)
Hemoglobin: 12.8 g/dL (ref 12.0–15.0)
LYMPHS ABS: 1.6 10*3/uL (ref 0.7–4.0)
LYMPHS PCT: 16 %
MCH: 27.5 pg (ref 26.0–34.0)
MCHC: 32.2 g/dL (ref 30.0–36.0)
MCV: 85.6 fL (ref 78.0–100.0)
MONO ABS: 0.4 10*3/uL (ref 0.1–1.0)
Monocytes Relative: 4 %
NEUTROS PCT: 79 %
Neutro Abs: 7.9 10*3/uL — ABNORMAL HIGH (ref 1.7–7.7)
Platelets: 226 10*3/uL (ref 150–400)
RBC: 4.65 MIL/uL (ref 3.87–5.11)
RDW: 14.5 % (ref 11.5–15.5)
WBC: 9.9 10*3/uL (ref 4.0–10.5)

## 2017-08-10 LAB — URINALYSIS, ROUTINE W REFLEX MICROSCOPIC
BILIRUBIN URINE: NEGATIVE
GLUCOSE, UA: NEGATIVE mg/dL
Ketones, ur: NEGATIVE mg/dL
Nitrite: NEGATIVE
PROTEIN: NEGATIVE mg/dL
Specific Gravity, Urine: 1.014 (ref 1.005–1.030)
pH: 7 (ref 5.0–8.0)

## 2017-08-10 LAB — BASIC METABOLIC PANEL
ANION GAP: 9 (ref 5–15)
BUN: 14 mg/dL (ref 6–20)
CALCIUM: 9.1 mg/dL (ref 8.9–10.3)
CO2: 25 mmol/L (ref 22–32)
CREATININE: 0.61 mg/dL (ref 0.44–1.00)
Chloride: 104 mmol/L (ref 101–111)
GFR calc non Af Amer: >60 mL/min (ref >60)
Glucose, Bld: 104 mg/dL — ABNORMAL HIGH (ref 65–99)
Potassium: 3.6 mmol/L (ref 3.5–5.1)
Sodium: 138 mmol/L (ref 135–145)

## 2017-08-10 LAB — HEPATIC FUNCTION PANEL
ALBUMIN: 4.1 g/dL (ref 3.5–5.0)
ALT: 11 U/L — AB (ref 14–54)
AST: 18 U/L (ref 15–41)
Alkaline Phosphatase: 73 U/L (ref 38–126)
Bilirubin, Direct: <0.1 mg/dL — ABNORMAL LOW (ref 0.1–0.5)
Total Bilirubin: 0.2 mg/dL — ABNORMAL LOW (ref 0.3–1.2)
Total Protein: 7.7 g/dL (ref 6.5–8.1)

## 2017-08-10 LAB — MAGNESIUM: Magnesium: 1.9 mg/dL (ref 1.7–2.4)

## 2017-08-10 LAB — LIPASE, BLOOD: Lipase: 32 U/L (ref 11–51)

## 2017-08-10 LAB — TROPONIN I
TROPONIN I: 0.03 ng/mL — AB (ref <0.03)
TROPONIN I: 0.03 ng/mL — AB (ref <0.03)

## 2017-08-10 MED ORDER — HEPARIN SODIUM (PORCINE) 5000 UNIT/ML IJ SOLN
5000.0000 [IU] | Freq: Three times a day (TID) | INTRAMUSCULAR | Status: DC
  Administered 2017-08-10: 5000 [IU] via SUBCUTANEOUS
  Filled 2017-08-10: qty 1

## 2017-08-10 MED ORDER — ACETAMINOPHEN 325 MG PO TABS
650.0000 mg | ORAL_TABLET | Freq: Four times a day (QID) | ORAL | Status: DC | PRN
  Administered 2017-08-10: 650 mg via ORAL
  Filled 2017-08-10 (×2): qty 2

## 2017-08-10 MED ORDER — METOPROLOL SUCCINATE ER 25 MG PO TB24
12.5000 mg | ORAL_TABLET | Freq: Every evening | ORAL | Status: DC
  Administered 2017-08-10 – 2017-08-11 (×2): 12.5 mg via ORAL
  Filled 2017-08-10 (×4): qty 1

## 2017-08-10 MED ORDER — PROMETHAZINE HCL 12.5 MG PO TABS: 12.5000 mg | ORAL_TABLET | Freq: Four times a day (QID) | ORAL | Status: DC | PRN

## 2017-08-10 MED ORDER — LISINOPRIL 10 MG PO TABS
20.0000 mg | ORAL_TABLET | Freq: Every evening | ORAL | Status: DC
  Administered 2017-08-10 – 2017-08-11 (×2): 20 mg via ORAL
  Filled 2017-08-10 (×2): qty 2

## 2017-08-10 MED ORDER — IOPAMIDOL (ISOVUE-370) INJECTION 76%
100.0000 mL | Freq: Once | INTRAVENOUS | Status: AC | PRN
  Administered 2017-08-10: 100 mL via INTRAVENOUS

## 2017-08-10 MED ORDER — AMLODIPINE BESYLATE 5 MG PO TABS
5.0000 mg | ORAL_TABLET | Freq: Every evening | ORAL | Status: DC
  Administered 2017-08-10 – 2017-08-11 (×2): 5 mg via ORAL
  Filled 2017-08-10 (×2): qty 1

## 2017-08-10 MED ORDER — LATANOPROST 0.005 % OP SOLN
1.0000 [drp] | Freq: Every day | OPHTHALMIC | Status: DC
  Administered 2017-08-10: 1 [drp] via OPHTHALMIC
  Filled 2017-08-10 (×2): qty 2.5

## 2017-08-10 MED ORDER — MECLIZINE HCL 12.5 MG PO TABS
12.5000 mg | ORAL_TABLET | Freq: Three times a day (TID) | ORAL | Status: DC | PRN
  Administered 2017-08-10: 12.5 mg via ORAL
  Filled 2017-08-10: qty 1

## 2017-08-10 MED ORDER — SODIUM CHLORIDE 0.9% FLUSH
3.0000 mL | Freq: Two times a day (BID) | INTRAVENOUS | Status: DC
  Administered 2017-08-10: 3 mL via INTRAVENOUS

## 2017-08-10 MED ORDER — ASPIRIN EC 81 MG PO TBEC
81.0000 mg | DELAYED_RELEASE_TABLET | Freq: Every day | ORAL | Status: DC
  Administered 2017-08-10 – 2017-08-11 (×2): 81 mg via ORAL
  Filled 2017-08-10 (×2): qty 1

## 2017-08-10 MED ORDER — SODIUM CHLORIDE 0.9 % IV SOLN
INTRAVENOUS | Status: DC
  Administered 2017-08-10: 100 mL/h via INTRAVENOUS

## 2017-08-10 NOTE — ED Triage Notes (Signed)
Family reports sudden onset of feeling diaphoretic, generalized weakness, and pain in left leg.  Reports history of blood clot in left leg in the past.

## 2017-08-10 NOTE — ED Notes (Signed)
Date and time results received: 08/10/17 3:46 PM  (use smartphrase ".now" to insert current time)  Test: Troponin Critical Value: 0.03  Name of Provider Notified: Clarene Duke  Orders Received? Or Actions Taken?: Orders Received - See Orders for details

## 2017-08-10 NOTE — H&P (Addendum)
History and Physical    Diana [name] OZH:[id number] DOB: [birth year] DOA: 08/10/2017  Referring MD/NP/PA: Clarene Duke PCP: Kirstie Peri, MD  Outpatient Specialists: [facility]- [facility]  Patient coming from: Home   Chief Complaint: Presyncope, positive troponin   HPI: Diana [name] is a 83 y.o. female with medical history significant of chronic DVT , PE, HTN, hyperthyroidism presenting w/ syncope vs presyncope patient states that she was doing some work in her house cleaning up her room when she became acutely dizzy and weak. Patient reports closing her eyes. No true loss of consciousness. States that it felt like the room is spinning. Denies any chest pain, shortness of breath. Denies any symptoms associated with getting up worse sitting down. No hemiparesis. No chest pain. + diaphoresis.  Symptoms occurred around 1 PM.  Baseline history of hypertension. Reports compliance with medication regimen. Pt w/ noted hx/o DVT and PE in the past. Was on xarelto up until approx 2 months ago per pt. Was taken off by PCP per pt. Pt unclear as to why.  ED Course: Presented to the ER afebrile, blood pressures in the 150s to 170s/100s. Labs grossly within normal limits total troponin noted to be at 0.03. EKG was sinus rhythm and supraventricular bigeminy. QTC at 507. CT head within normal limits. Chest x-ray with borderline enlarged heart.  Review of Systems: As per HPI otherwise 10 point review of systems negative.    Past Medical History:  Diagnosis Date  . Chronic deep vein thrombosis (DVT) of popliteal vein of left lower extremity (HCC)   . Chronic venous insufficiency   . DVT of lower extremity (deep venous thrombosis) (HCC)   . Enlarged heart   . Hypertension   . Hyperthyroidism   . PE (pulmonary thromboembolism) (HCC)     Past Surgical History:  Procedure Laterality Date  . BREAST LUMPECTOMY    . TUBAL LIGATION       reports that she has quit smoking. She has never used smokeless  tobacco. She reports that she does not drink alcohol or use drugs.  No Known Allergies  No family history on file.   Prior to Admission medications   Medication Sig Start Date End Date Taking? Authorizing Provider  acetaminophen (TYLENOL) 325 MG tablet Take 2 tablets (650 mg total) by mouth every 6 (six) hours as needed for mild pain (or Fever >/= 101). 08/12/16  Yes Filbert Schilder, MD  amLODipine (NORVASC) 5 MG tablet Take 5 mg by mouth every evening. 07/14/17  Yes [provider]  aspirin EC 81 MG tablet Take 81 mg by mouth daily.   Yes [provider]  ibuprofen (ADVIL,MOTRIN) 800 MG tablet Take 1 tablet by mouth 3 (three) times daily as needed. For pain 07/14/17  Yes [provider]  latanoprost (XALATAN) 0.005 % ophthalmic solution Place 1 drop into both eyes at bedtime. 07/14/17  Yes [provider]  lisinopril (PRINIVIL,ZESTRIL) 20 MG tablet Take 20 mg by mouth every evening.  06/11/16  Yes [provider]  meclizine (ANTIVERT) 12.5 MG tablet Take 12.5 mg by mouth 3 (three) times daily as needed for dizziness or nausea.  07/14/16  Yes [provider]  metoprolol succinate (TOPROL-XL) 25 MG 24 hr tablet Take 12.5 mg by mouth every evening.  06/11/16  Yes [provider]    Physical Exam: Vitals:   08/10/17 1600 08/10/17 1603 08/10/17 1630 08/10/17 1730  BP: (!) 158/107 (!) 169/60  (!) 177/87  Pulse: (!) 44 73 78 92  Resp: 19 (!) 25 (!) 24 18  Temp:      TempSrc:      SpO2: 100% 99% 98% 100%  Weight:      Height:          Constitutional: NAD, calm, comfortable Vitals:   08/10/17 1600 08/10/17 1603 08/10/17 1630 08/10/17 1730  BP: (!) 158/107 (!) 169/60  (!) 177/87  Pulse: (!) 44 73 78 92  Resp: 19 (!) 25 (!) 24 18  Temp:      TempSrc:      SpO2: 100% 99% 98% 100%  Weight:      Height:       Eyes: PERRL, lids and conjunctivae normal ENMT: Mucous membranes are moist. Posterior pharynx clear of any exudate  or lesions.Normal dentition.  Neck: normal, supple, no masses, no thyromegaly Respiratory: clear to auscultation bilaterally, no wheezing, no crackles. Normal respiratory effort. No accessory muscle use.  Cardiovascular: Regular rate and rhythm, no murmurs / rubs / gallops. No extremity edema. 2+ pedal pulses. No carotid bruits.  Abdomen: no tenderness, no masses palpated. No hepatosplenomegaly. Bowel sounds positive.  Musculoskeletal: no clubbing / cyanosis. No joint deformity upper and lower extremities. Good ROM, no contractures. Normal muscle tone.  Skin: no rashes, lesions, ulcers. No induration Neurologic: CN 2-12 grossly intact. Sensation intact, DTR normal. Strength 5/5 in all 4.  Psychiatric: Normal judgment and insight. Alert and oriented x 3. Normal mood.   Labs on Admission: I have personally reviewed following labs and imaging studies  CBC:  Recent Labs Lab 08/10/17 1437  WBC 9.9  NEUTROABS 7.9*  HGB 12.8  HCT 39.8  MCV 85.6  PLT 226   Basic Metabolic Panel:  Recent Labs Lab 08/10/17 1437  NA 138  K 3.6  CL 104  CO2 25  GLUCOSE 104*  BUN 14  CREATININE 0.61  CALCIUM 9.1  MG 1.9   GFR: Estimated Creatinine Clearance: 61.7 mL/min (by C-G formula based on SCr of 0.61 mg/dL). Liver Function Tests:  Recent Labs Lab 08/10/17 1437  AST 18  ALT 11*  ALKPHOS 73  BILITOT 0.2*  PROT 7.7  ALBUMIN 4.1    Recent Labs Lab 08/10/17 1437  LIPASE 32   No results for input(s): AMMONIA in the last 168 hours. Coagulation Profile: No results for input(s): INR, PROTIME in the last 168 hours. Cardiac Enzymes:  Recent Labs Lab 08/10/17 1437  TROPONINI 0.03*   BNP (last 3 results) No results for input(s): PROBNP in the last 8760 hours. HbA1C: No results for input(s): HGBA1C in the last 72 hours. CBG: No results for input(s): GLUCAP in the last 168 hours. Lipid Profile: No results for input(s): CHOL, HDL, LDLCALC, TRIG, CHOLHDL, LDLDIRECT in the last 72  hours. Thyroid Function Tests: No results for input(s): TSH, T4TOTAL, FREET4, T3FREE, THYROIDAB in the last 72 hours. Anemia Panel: No results for input(s): VITAMINB12, FOLATE, FERRITIN, TIBC, IRON, RETICCTPCT in the last 72 hours. Urine analysis:    Component Value Date/Time   COLORURINE YELLOW 08/10/2017 1437   APPEARANCEUR HAZY (A) 08/10/2017 1437   LABSPEC 1.014 08/10/2017 1437   PHURINE 7.0 08/10/2017 1437   GLUCOSEU NEGATIVE 08/10/2017 1437   HGBUR SMALL (A) 08/10/2017 1437   BILIRUBINUR NEGATIVE 08/10/2017 1437   KETONESUR NEGATIVE 08/10/2017 1437   PROTEINUR NEGATIVE 08/10/2017 1437   NITRITE NEGATIVE 08/10/2017 1437   LEUKOCYTESUR MODERATE (A) 08/10/2017 1437   Sepsis Labs: (procalcitonin:4,lacticidven:4) )No results found for this or any previous visit (from the past 240  hour(s)).   Radiological Exams on Admission: Dg Chest 2 View  Result Date: 08/10/2017 CLINICAL DATA:  Diaphoresis.  Weakness. EXAM: CHEST  2 VIEW COMPARISON:  August 10, 2016 FINDINGS: There is no edema or consolidation. Heart is borderline enlarged with pulmonary vascularity within normal limits. No adenopathy. There is aortic atherosclerosis. There is degenerative change in the thoracolumbar junction region. IMPRESSION: Heart borderline enlarged. Aortic atherosclerosis. No edema or consolidation. Aortic Atherosclerosis (ICD10-I70.0). Electronically Signed   By: Bretta Bang III M.D.   On: 08/10/2017 17:11   Ct Head Wo Contrast  Result Date: 08/10/2017 CLINICAL DATA:  Recurrent syncope, diaphoresis, weakness EXAM: CT HEAD WITHOUT CONTRAST TECHNIQUE: Contiguous axial images were obtained from the base of the skull through the vertex without intravenous contrast. COMPARISON:  None available FINDINGS: Brain: Age related brain atrophy and chronic white matter microvascular ischemic changes noted. No acute intracranial hemorrhage, mass lesion, infarction, midline shift, herniation,  hydrocephalus, or extra-axial fluid collection. No focal mass effect or edema. Cisterns are patent. Cerebellar atrophy as well. Vascular: Intracranial atherosclerosis noted.  No hyperdense vessel. Skull: Normal. Negative for fracture or focal lesion. Sinuses/Orbits: No acute finding. Other: None. IMPRESSION: Age related brain atrophy and chronic white matter microvascular changes. No acute intracranial abnormality by noncontrast CT. Electronically Signed   By: Judie Petit.  Shick M.D.   On: 08/10/2017 17:05   Ct Angio Chest Pe W/cm &/or Wo Cm  Result Date: 08/10/2017 CLINICAL DATA:  Syncope, diaphoresis, weakness, shortness of breath, prior history of pulmonary embolus EXAM: CT ANGIOGRAPHY CHEST WITH CONTRAST TECHNIQUE: Multidetector CT imaging of the chest was performed using the standard protocol during bolus administration of intravenous contrast. Multiplanar CT image reconstructions and MIPs were obtained to evaluate the vascular anatomy. CONTRAST:  100 cc Isovue 370 COMPARISON:  08/10/2016 FINDINGS: Cardiovascular: Pulmonary arteries are well visualized. No significant filling defect or pulmonary embolus demonstrated by CTA. Mild prominence of the central pulmonary arteries, query component of pulmonary arterial hypertension. Atherosclerotic changes of the thoracic aorta. Negative for aneurysm or dissection. Three-vessel arch anatomy appearing patent. Cardiomegaly evident.  No pericardial effusion. Mediastinum/Nodes: Right thyroid enlargement, unchanged. Trachea and esophagus unremarkable. No adenopathy. Lungs/Pleura: Lungs are clear. No pleural effusion or pneumothorax. Upper Abdomen: Similar scattered varying sized hepatic cysts. Cholelithiasis evident. No biliary dilatation. No other acute upper abdominal finding. Musculoskeletal: Thoracic degenerative change noted. No chest wall soft tissue abnormality or asymmetry. No acute compression fracture. Sternum intact. Review of the MIP images confirms the above  findings. IMPRESSION: Negative for significant acute pulmonary embolus by CTA. No acute intrathoracic finding. Cardiomegaly without CHF or edema. Cholelithiasis Stable hepatic cysts Aortic Atherosclerosis (ICD10-I70.0). Electronically Signed   By: Judie Petit.  Shick M.D.   On: 08/10/2017 17:12    EKG: Independently reviewed. See review in HPI.   Assessment/Plan Active Problems:   Pre-syncope   Elevated troponin     1- Pre-Syncope  -Will need to rule out neuro- cardiogenic source of sxs  -will obtain 2D ECHO  -check orthostatics  -Given age and HTN, may benefit from formal MRI  -IVF hydration  -pt noted to be on meclizine on outpt basis- ? Vertiginous flare -acutely increase dose for above   2-Elevated Trop  -Noted trop 0.03 -? Atypical presentation in setting of above  -EKG w/ sinus rhythm and supraventricular bigeminy -noted accelerated HTN on presentation  -will cycle CEs -ASA -cards c/s as clinically indicated   3-Hx/o DVT and PE  -no currently anticoagulated  -noted RLE popliteal TTP  -will check  LE u/s  - d dimer x 1 - low threshold for treatment dose lovenox  4- HTN  -Elevated BP on presentation  -titrate home regimen     DVT prophylaxis: sub q heparin   Code Status: Full Code   Family Communication: multiple family members at bedside  Disposition Plan: Pending further evaluation   Consults called: none at present  Admission status: Obs   Floydene Flock MD [facility] Pager [phone]  If 7PM-7AM, please contact night-coverage [email or web address] Password [email or web address]  08/10/2017, 6:18 PM

## 2017-08-10 NOTE — ED Provider Notes (Signed)
[facility] DEPT Provider Note   CSN: [id number] Arrival date & time: 08/10/17  1419     History   Chief Complaint Chief Complaint  Patient presents with  . Near Syncope  . Fatigue    HPI Diana [name] is a 83 y.o. female.  HPI  Pt was seen at 1500. Per pt, c/o sudden onset and resolution of one episode of generalized weakness that occurred PTA. Pt states she was sitting "looking at papers" when she suddenly "didn't feel right." Pt felt lightheaded, and "may" have had a syncopal episode. States this was followed by N/V. Denies CP/palpitations, no SOB/cough, no abd pain, no diarrhea, no back pain, no fall, no visual changes, no focal motor weakness, no tingling/numbness in extremities, no ataxia, no slurred speech, no facial droop.    Past Medical History:  Diagnosis Date  . Chronic deep vein thrombosis (DVT) of popliteal vein of left lower extremity (HCC)   . Chronic venous insufficiency   . DVT of lower extremity (deep venous thrombosis) (HCC)   . Enlarged heart   . Hypertension   . Hyperthyroidism   . PE (pulmonary throm[facility]embolism) [facility])     Patient Active Problem List   Diagnosis Date Noted  . DVT (deep venous thrombosis) (HCC) 08/10/2016  . Pulmonary embolism (HCC) 08/10/2016  . HTN (hypertension) 08/10/2016    Past Surgical History:  Procedure Laterality Date  . BREAST LUMPECTOMY    . TUBAL LIGATION      OB History    No data available       Home Medications    Prior to Admission medications   Medication Sig Start Date End Date Taking? Authorizing Provider  acetaminophen (TYLENOL) 325 MG tablet Take 2 tablets (650 mg total) by mouth every 6 (six) hours as needed for mild pain (or Fever >/= 101). 08/12/16   Filbert Schilder, MD  aspirin EC 81 MG tablet Take 81 mg by mouth daily.    [provider]  lisinopril (PRINIVIL,ZESTRIL) 20 MG tablet Take 20 mg by mouth daily.  06/11/16   [provider]  meclizine (ANTIVERT) 12.5 MG  tablet Take 12.5 mg by mouth 3 (three) times daily as needed for dizziness or nausea.  07/14/16   [provider]  metoprolol succinate (TOPROL-XL) 25 MG 24 hr tablet Take 12.5 mg by mouth daily. 06/11/16   [provider]  Rivaroxaban 15 & 20 MG TBPK Take as directed on package: Start with one  tablet by mouth twice a day with food. On Day 22, switch to one  tablet once a day with food. 08/12/16   Filbert Schilder, MD    Family History No family history on file.  Social History Social History  Substance Use Topics  . Smoking status: Former Games developer  . Smokeless tobacco: Never Used  . Alcohol use No     Allergies   Patient has no known allergies.   Review of Systems Review of Systems ROS: Statement: All systems negative except as marked or noted in the HPI; Constitutional: Negative for fever and chills. ; ; Eyes: Negative for eye pain, redness and discharge. ; ; ENMT: Negative for ear pain, hoarseness, nasal congestion, sinus pressure and sore throat. ; ; Cardiovascular: Negative for chest pain, palpitations, diaphoresis, dyspnea and peripheral edema. ; ; Respiratory: Negative for cough, wheezing and stridor. ; ; Gastrointestinal: +N/V. Negative for diarrhea, abdominal pain, blood in stool, hematemesis, jaundice and rectal bleeding. . ; ; Genitourinary: Negative for dysuria, flank pain  and hematuria. ; ; Musculoskeletal: Negative for back pain and neck pain. Negative for swelling and trauma.; ; Skin: Negative for pruritus, rash, abrasions, blisters, bruising and skin lesion.; ; Neuro: Negative for headache and neck stiffness. Negative for extremity weakness, paresthesias, involuntary movement, seizure and +near syncope, syncope, generalized weakness.      Physical Exam Updated Vital Signs BP (!) 169/60   Pulse 73   Temp 97.9 F (36.6 C) (Oral)   Resp (!) 25   Ht  (1.626 m)   Wt 95.3 kg (210 lb)   SpO2 99%   BMI 36.05 kg/m    15:42 Orthostatic  Vital Signs VB  Orthostatic Lying   BP- Lying: 169/60  Pulse- Lying: 55      Orthostatic Sitting  BP- Sitting:  184/91  Pulse- Sitting: 63      Orthostatic Standing at 0 minutes  BP- Standing at 0 minutes: 180/78  Pulse- Standing at 0 minutes: 58     Physical Exam 1505: Physical examination:  Nursing notes reviewed; Vital signs and O2 SAT reviewed;  Constitutional: Well developed, Well nourished, Well hydrated, In no acute distress; Head:  Normocephalic, atraumatic; Eyes: EOMI, PERRL, No scleral icterus; ENMT: Mouth and pharynx normal, Mucous membranes moist; Neck: Supple, Full range of motion, No lymphadenopathy; Cardiovascular: Regular rate and rhythm, No gallop; Respiratory: Breath sounds clear & equal bilaterally, No wheezes.  Speaking full sentences with ease, Normal respiratory effort/excursion; Chest: Nontender, Movement normal; Abdomen: Soft, Nontender, Nondistended, Normal bowel sounds; Genitourinary: No CVA tenderness; Extremities: Pulses normal, No tenderness, No edema, No calf edema or asymmetry.; Neuro: AA&Ox3, Major CN grossly intact. Speech clear.  No facial droop.  Grips equal. Strength 5/5 equal bilat UE's and LE's.  DTR 2/4 equal bilat UE's and LE's.  No gross sensory deficits.  Normal cerebellar testing bilat UE's (finger-nose) and LE's (heel-shin)..; Skin: Color normal, Warm, Dry.   ED Treatments / Results  Labs (all labs ordered are listed, but only abnormal results are displayed)   EKG  EKG Interpretation  Date/Time:  Wednesday August 10 2017 14:27:29 EDT Ventricular Rate:  100 PR Interval:    QRS Duration: 87 QT Interval:  393 QTC Calculation: 507 R Axis:   19 Text Interpretation:  Sinus rhythm Supraventricular bigeminy Borderline T abnormalities, anterior leads Prolonged QT interval When compared with ECG of 08/10/2016 QT has lengthened Confirmed by Samuel Jester [phone]) on 08/10/2017 3:11:43 PM       Radiology   Procedures Procedures  (including critical care time)  Medications Ordered in ED Medications  iopamidol (ISOVUE-370) 76 % injection 100 mL (100 mLs Intravenous Contrast Given 08/10/17 1629)     Initial Impression / Assessment and Plan / ED Course  I have reviewed the triage vital signs and the nursing notes.  Pertinent labs & imaging results that were available during my care of the patient were reviewed by me and considered in my medical decision making (see chart for details).  MDM Reviewed: previous chart, nursing note and vitals Reviewed previous: labs and ECG Interpretation: labs, ECG and CT scan   Results for orders placed or performed during the hospital encounter of 08/10/17  CBC with Differential  Result Value Ref Range   WBC 9.9 4.0 - 10.5 K/uL   RBC 4.65 3.87 - 5.11 MIL/uL   Hemoglobin 12.8 12.0 - 15.0 g/dL   HCT 60.4 54.0 - 98.1 %   MCV 85.6 78.0 - 100.0 fL   MCH 27.5 26.0 - 34.0 pg  MCHC 32.2 30.0 - 36.0 g/dL   RDW 16.1 09.6 - 04.5 %   Platelets 226 150 - 400 K/uL   Neutrophils Relative % 79 %   Neutro Abs 7.9 (H) 1.7 - 7.7 K/uL   Lymphocytes Relative 16 %   Lymphs Abs 1.6 0.7 - 4.0 K/uL   Monocytes Relative 4 %   Monocytes Absolute 0.4 0.1 - 1.0 K/uL   Eosinophils Relative 1 %   Eosinophils Absolute 0.1 0.0 - 0.7 K/uL   Basophils Relative 0 %   Basophils Absolute 0.0 0.0 - 0.1 K/uL  Basic metabolic panel  Result Value Ref Range   Sodium 138 135 - 145 mmol/L   Potassium 3.6 3.5 - 5.1 mmol/L   Chloride 104 101 - 111 mmol/L   CO2 25 22 - 32 mmol/L   Glucose, Bld 104 (H) 65 - 99 mg/dL   BUN 14 6 - 20 mg/dL   Creatinine, Ser 4.09 0.44 - 1.00 mg/dL   Calcium 9.1 8.9 - 81.1 mg/dL   GFR calc non Af Amer >60 >60 mL/min   GFR calc Af Amer >60 >60 mL/min   Anion gap 9 5 - 15  Troponin I  Result Value Ref Range   Troponin I 0.03 (HH) <0.03 ng/mL  Urinalysis, Routine w reflex microscopic  Result Value Ref Range   Color, Urine YELLOW YELLOW   APPearance HAZY (A) CLEAR   Specific  Gravity, Urine 1.014 1.005 - 1.030   pH 7.0 5.0 - 8.0   Glucose, UA NEGATIVE NEGATIVE mg/dL   Hgb urine dipstick SMALL (A) NEGATIVE   Bilirubin Urine NEGATIVE NEGATIVE   Ketones, ur NEGATIVE NEGATIVE mg/dL   Protein, ur NEGATIVE NEGATIVE mg/dL   Nitrite NEGATIVE NEGATIVE   Leukocytes, UA MODERATE (A) NEGATIVE   RBC / HPF 6-30 0 - 5 RBC/hpf   WBC, UA 6-30 0 - 5 WBC/hpf   Bacteria, UA RARE (A) NONE SEEN   Squamous Epithelial / LPF 0-5 (A) NONE SEEN  Hepatic function panel  Result Value Ref Range   Total Protein 7.7 6.5 - 8.1 g/dL   Albumin 4.1 3.5 - 5.0 g/dL   AST 18 15 - 41 U/L   ALT 11 (L) 14 - 54 U/L   Alkaline Phosphatase 73 38 - 126 U/L   Total Bilirubin 0.2 (L) 0.3 - 1.2 mg/dL   Bilirubin, Direct <9.1 (L) 0.1 - 0.5 mg/dL   Indirect Bilirubin NOT CALCULATED 0.3 - 0.9 mg/dL  Lipase, blood  Result Value Ref Range   Lipase 32 11 - 51 U/L  Magnesium  Result Value Ref Range   Magnesium 1.9 1.7 - 2.4 mg/dL   Dg Chest 2 View Result Date: 08/10/2017 CLINICAL DATA:  Diaphoresis.  Weakness. EXAM: CHEST  2 VIEW COMPARISON:  August 10, 2016 FINDINGS: There is no edema or consolidation. Heart is borderline enlarged with pulmonary vascularity within normal limits. No adenopathy. There is aortic atherosclerosis. There is degenerative change in the thoracolumbar junction region. IMPRESSION: Heart borderline enlarged. Aortic atherosclerosis. No edema or consolidation. Aortic Atherosclerosis (ICD10-I70.0). Electronically Signed   By: Bretta Bang III M.D.   On: 08/10/2017 17:11   Ct Head Wo Contrast Result Date: 08/10/2017 CLINICAL DATA:  Recurrent syncope, diaphoresis, weakness EXAM: CT HEAD WITHOUT CONTRAST TECHNIQUE: Contiguous axial images were obtained from the base of the skull through the vertex without intravenous contrast. COMPARISON:  None available FINDINGS: Brain: Age related brain atrophy and chronic white matter microvascular ischemic changes noted. No acute intracranial  hemorrhage, mass lesion, infarction, midline shift, herniation, hydrocephalus, or extra-axial fluid collection. No focal mass effect or edema. Cisterns are patent. Cerebellar atrophy as well. Vascular: Intracranial atherosclerosis noted.  No hyperdense vessel. Skull: Normal. Negative for fracture or focal lesion. Sinuses/Orbits: No acute finding. Other: None. IMPRESSION: Age related brain atrophy and chronic white matter microvascular changes. No acute intracranial abnormality by noncontrast CT. Electronically Signed   By: Judie Petit.  Shick M.D.   On: 08/10/2017 17:05   Ct Angio Chest Pe W/cm &/or Wo Cm Result Date: 08/10/2017 CLINICAL DATA:  Syncope, diaphoresis, weakness, shortness of breath, prior history of pulmonary embolus EXAM: CT ANGIOGRAPHY CHEST WITH CONTRAST TECHNIQUE: Multidetector CT imaging of the chest was performed using the standard protocol during bolus administration of intravenous contrast. Multiplanar CT image reconstructions and MIPs were obtained to evaluate the vascular anatomy. CONTRAST:  100 cc Isovue 370 COMPARISON:  08/10/2016 FINDINGS: Cardiovascular: Pulmonary arteries are well visualized. No significant filling defect or pulmonary embolus demonstrated by CTA. Mild prominence of the central pulmonary arteries, query component of pulmonary arterial hypertension. Atherosclerotic changes of the thoracic aorta. Negative for aneurysm or dissection. Three-vessel arch anatomy appearing patent. Cardiomegaly evident.  No pericardial effusion. Mediastinum/Nodes: Right thyroid enlargement, unchanged. Trachea and esophagus unremarkable. No adenopathy. Lungs/Pleura: Lungs are clear. No pleural effusion or pneumothorax. Upper Abdomen: Similar scattered varying sized hepatic cysts. Cholelithiasis evident. No biliary dilatation. No other acute upper abdominal finding. Musculoskeletal: Thoracic degenerative change noted. No chest wall soft tissue abnormality or asymmetry. No acute compression fracture.  Sternum intact. Review of the MIP images confirms the above findings. IMPRESSION: Negative for significant acute pulmonary embolus by CTA. No acute intrathoracic finding. Cardiomegaly without CHF or edema. Cholelithiasis Stable hepatic cysts Aortic Atherosclerosis (ICD10-I70.0). Electronically Signed   By: Judie Petit.  Shick M.D.   On: 08/10/2017 17:12    1745:  Not orthostatic on VS. Workup reassuring. Dx and testing d/w pt and family.  Questions answered.  Verb understanding, agreeable to admit.  T/C to [facility] Dr. Alvester Morin, case discussed, including:  HPI, pertinent PM/SHx, VS/PE, dx testing, ED course and treatment:  Agreeable to observation admit.   Final Clinical Impressions(s) / ED Diagnoses   Final diagnoses:  None    New Prescriptions New Prescriptions   No medications on file     Samuel Jester, DO 08/15/17 1210

## 2017-08-11 ENCOUNTER — Observation Stay (HOSPITAL_BASED_OUTPATIENT_CLINIC_OR_DEPARTMENT_OTHER): Payer: PPO

## 2017-08-11 ENCOUNTER — Observation Stay (HOSPITAL_COMMUNITY): Payer: PPO

## 2017-08-11 DIAGNOSIS — R748 Abnormal levels of other serum enzymes: Secondary | ICD-10-CM | POA: Diagnosis not present

## 2017-08-11 DIAGNOSIS — R55 Syncope and collapse: Secondary | ICD-10-CM

## 2017-08-11 DIAGNOSIS — I340 Nonrheumatic mitral (valve) insufficiency: Secondary | ICD-10-CM

## 2017-08-11 DIAGNOSIS — I10 Essential (primary) hypertension: Secondary | ICD-10-CM

## 2017-08-11 DIAGNOSIS — M79661 Pain in right lower leg: Secondary | ICD-10-CM | POA: Diagnosis not present

## 2017-08-11 DIAGNOSIS — R112 Nausea with vomiting, unspecified: Secondary | ICD-10-CM | POA: Diagnosis present

## 2017-08-11 LAB — ECHOCARDIOGRAM COMPLETE
Height: 64 in
Weight: 3365.1 oz

## 2017-08-11 LAB — GLUCOSE, CAPILLARY: GLUCOSE-CAPILLARY: 158 mg/dL — AB (ref 65–99)

## 2017-08-11 LAB — TROPONIN I
TROPONIN I: 0.03 ng/mL — AB (ref <0.03)
TROPONIN I: 0.03 ng/mL — AB (ref <0.03)

## 2017-08-11 LAB — D-DIMER, QUANTITATIVE (NOT AT ARMC): D DIMER QUANT: 0.76 ug{FEU}/mL — AB (ref 0.00–0.50)

## 2017-08-11 MED ORDER — ENOXAPARIN SODIUM 100 MG/ML ~~LOC~~ SOLN
1.0000 mg/kg | Freq: Two times a day (BID) | SUBCUTANEOUS | Status: DC
  Administered 2017-08-11: 95 mg via SUBCUTANEOUS
  Filled 2017-08-11 (×2): qty 1

## 2017-08-11 NOTE — Discharge Summary (Signed)
Physician Discharge Summary  Diana [name] UJW:[id number] DOB: 12-Nov-1934 DOA: 08/10/2017  PCP: Kirstie Peri, MD  Admit date: 08/10/2017 Discharge date: 08/11/2017  Admitted From: home Disposition:  home  Recommendations for Outpatient Follow-up:  1. Follow up with PCP in 1-2 weeks   Home Health:none Equipment/Devices:none  Discharge Condition:fair CODE STATUS: full code Diet recommendation: regular   Discharge Diagnoses:  Principal Problem:   Pre-syncope  Active Problems:   Elevated troponin   Nausea and vomiting in adult   Essential hypertension  Brief narrative/ HPI 83 y.o. female with medical history significant of chronic DVT , PE, HTN, hyperthyroidism presenting w/ syncope vs presyncope.  patient states that she was sitting in a recliner and trying to get up to finish some chores when she became lightheaded and diaphoretic with room spinning around. She felt nauseous and had an episode of vomiting. She sat down and felt like she was going to pass out . Does not remember if she did. Denies chest pain, palpitations or SOB. Reports some abdominal discomfort. No recent illness, change in medications or sick contacts. no diarrhea or dysuria. No similar symptoms in past. She was diagnosed with DVT and PE 1 year back and treated with xarelto for 6 months until February this year. Reports remote hxof DVT as well.    ED Course:  Patient was  afebrile, blood pressures in the 150s to 170s/100s. Labs grossly within normal limits total troponin noted to be at 0.03. EKG was sinus rhythm and supraventricular bigeminy. QTC at 507. CT head within normal limits. Chest x-ray with borderline enlarged heart. Placed on observation.   Hospital course Near syncope  appears to be vasovagal with some orthostasis. Also has hx of vertigo on meclizine. No further symptoms. CT angiogram chest negative for PE. Reports poor hydration at home and encouraged on increased fluid intake. 2d echo with EF  of 55%, grade 2 diastolic dysfunction and no WMA.  Elevated troponin  mild, flat at 0.03. No chest pain symptoms and stable on telemetry. Reports normal stress test several years ago. Echo without Wall motion abnormality. Low suspicion for ACS. Recommend outpt cardiology referral if symptoms reoccur.  elevated Ddimer Doppler LE negative for DVT. CTA neg for PE.  Essential HTN  resume home meds  Procedure  head CT  echo  CT angio chest  doppler LE  Consults: none  Family communication: husband and son at bedside  Disposition  home   Discharge Instructions   Allergies as of 08/11/2017   No Known Allergies     Medication List    TAKE these medications   acetaminophen 325 MG tablet Commonly known as:  TYLENOL Take 2 tablets (650 mg total) by mouth every 6 (six) hours as needed for mild pain (or Fever >/= 101).   amLODipine 5 MG tablet Commonly known as:  NORVASC Take 5 mg by mouth every evening.   aspirin EC 81 MG tablet Take 81 mg by mouth daily.   ibuprofen 800 MG tablet Commonly known as:  ADVIL,MOTRIN Take 1 tablet by mouth 3 (three) times daily as needed. For pain   latanoprost 0.005 % ophthalmic solution Commonly known as:  XALATAN Place 1 drop into both eyes at bedtime.   lisinopril 20 MG tablet Commonly known as:  PRINIVIL,ZESTRIL Take 20 mg by mouth every evening.   meclizine 12.5 MG tablet Commonly known as:  ANTIVERT Take 12.5 mg by mouth 3 (three) times daily as needed for dizziness or nausea.   metoprolol succinate 25  MG 24 hr tablet Commonly known as:  TOPROL-XL Take 12.5 mg by mouth every evening.      Follow-up Information    Kirstie Peri, MD. Schedule an appointment as soon as possible for a visit in 1 week(s).   Specialty:  Internal Medicine Contact information: [address] [place] [address] [phone]          No Known Allergies    Procedures/Studies: Dg Chest 2 View  Result Date: 08/10/2017 CLINICAL DATA:   Diaphoresis.  Weakness. EXAM: CHEST  2 VIEW COMPARISON:  August 10, 2016 FINDINGS: There is no edema or consolidation. Heart is borderline enlarged with pulmonary vascularity within normal limits. No adenopathy. There is aortic atherosclerosis. There is degenerative change in the thoracolumbar junction region. IMPRESSION: Heart borderline enlarged. Aortic atherosclerosis. No edema or consolidation. Aortic Atherosclerosis (ICD10-I70.0). Electronically Signed   By: Bretta Bang III M.D.   On: 08/10/2017 17:11   Ct Head Wo Contrast  Result Date: 08/10/2017 CLINICAL DATA:  Recurrent syncope, diaphoresis, weakness EXAM: CT HEAD WITHOUT CONTRAST TECHNIQUE: Contiguous axial images were obtained from the base of the skull through the vertex without intravenous contrast. COMPARISON:  None available FINDINGS: Brain: Age related brain atrophy and chronic white matter microvascular ischemic changes noted. No acute intracranial hemorrhage, mass lesion, infarction, midline shift, herniation, hydrocephalus, or extra-axial fluid collection. No focal mass effect or edema. Cisterns are patent. Cerebellar atrophy as well. Vascular: Intracranial atherosclerosis noted.  No hyperdense vessel. Skull: Normal. Negative for fracture or focal lesion. Sinuses/Orbits: No acute finding. Other: None. IMPRESSION: Age related brain atrophy and chronic white matter microvascular changes. No acute intracranial abnormality by noncontrast CT. Electronically Signed   By: Judie Petit.  Shick M.D.   On: 08/10/2017 17:05   Ct Angio Chest Pe W/cm &/or Wo Cm  Result Date: 08/10/2017 CLINICAL DATA:  Syncope, diaphoresis, weakness, shortness of breath, prior history of pulmonary embolus EXAM: CT ANGIOGRAPHY CHEST WITH CONTRAST TECHNIQUE: Multidetector CT imaging of the chest was performed using the standard protocol during bolus administration of intravenous contrast. Multiplanar CT image reconstructions and MIPs were obtained to evaluate the vascular  anatomy. CONTRAST:  100 cc Isovue 370 COMPARISON:  08/10/2016 FINDINGS: Cardiovascular: Pulmonary arteries are well visualized. No significant filling defect or pulmonary embolus demonstrated by CTA. Mild prominence of the central pulmonary arteries, query component of pulmonary arterial hypertension. Atherosclerotic changes of the thoracic aorta. Negative for aneurysm or dissection. Three-vessel arch anatomy appearing patent. Cardiomegaly evident.  No pericardial effusion. Mediastinum/Nodes: Right thyroid enlargement, unchanged. Trachea and esophagus unremarkable. No adenopathy. Lungs/Pleura: Lungs are clear. No pleural effusion or pneumothorax. Upper Abdomen: Similar scattered varying sized hepatic cysts. Cholelithiasis evident. No biliary dilatation. No other acute upper abdominal finding. Musculoskeletal: Thoracic degenerative change noted. No chest wall soft tissue abnormality or asymmetry. No acute compression fracture. Sternum intact. Review of the MIP images confirms the above findings. IMPRESSION: Negative for significant acute pulmonary embolus by CTA. No acute intrathoracic finding. Cardiomegaly without CHF or edema. Cholelithiasis Stable hepatic cysts Aortic Atherosclerosis (ICD10-I70.0). Electronically Signed   By: Judie Petit.  Shick M.D.   On: 08/10/2017 17:12   US Venous Img Lower Unilateral Right  Result Date: 08/11/2017 CLINICAL DATA:  Right lower extremity pain. EXAM: Right LOWER EXTREMITY VENOUS DOPPLER ULTRASOUND TECHNIQUE: Gray-scale sonography with graded compression, as well as color Doppler and duplex ultrasound were performed to evaluate the lower extremity deep venous systems from the level of the common femoral vein and including the common femoral, femoral, profunda femoral, popliteal and  calf veins including the posterior tibial, peroneal and gastrocnemius veins when visible. The superficial great saphenous vein was also interrogated. Spectral Doppler was utilized to evaluate flow at rest and  with distal augmentation maneuvers in the common femoral, femoral and popliteal veins. COMPARISON:  None. FINDINGS: Contralateral Common Femoral Vein: Respiratory phasicity is normal and symmetric with the symptomatic side. No evidence of thrombus. Normal compressibility. Common Femoral Vein: No evidence of thrombus. Normal compressibility, respiratory phasicity and response to augmentation. Saphenofemoral Junction: No evidence of thrombus. Normal compressibility and flow on color Doppler imaging. Profunda Femoral Vein: No evidence of thrombus. Normal compressibility and flow on color Doppler imaging. Femoral Vein: No evidence of thrombus. Normal compressibility, respiratory phasicity and response to augmentation. Popliteal Vein: No evidence of thrombus. Normal compressibility, respiratory phasicity and response to augmentation. Calf Veins: No evidence of thrombus. Normal compressibility and flow on color Doppler imaging. Superficial Great Saphenous Vein: No evidence of thrombus. Normal compressibility and flow on color Doppler imaging. Venous Reflux:  None. Other Findings:  None. IMPRESSION: No evidence of DVT within the right lower extremity. Electronically Signed   By: Elberta Fortis M.D.   On: 08/11/2017 10:52       Subjective: denies dizziness or chest discomfort. Stable on tele  Discharge Exam: Vitals:   08/11/17 0007 08/11/17 0409  BP:    Pulse:    Resp: 18 17  Temp: 98.7 F (37.1 C) 98 F (36.7 C)  SpO2: 100%    Vitals:   08/10/17 2135 08/11/17 0007 08/11/17 0409 08/11/17 0500  BP:      Pulse:      Resp: Temp: 97.6 F (36.4 C) 98.7 F (37.1 C) 98 F (36.7 C)   TempSrc: Oral Oral Oral   SpO2: 100% 100%    Weight:    95.4 kg (210 lb 5.1 oz)  Height:        General: elderly female in NAD HEENT: moist mucosa, supple neck  chest: clear b/l, no added sounds CVS: NS1&S2, no murmurs GI: soft, NT, ND Musculoskeletal: warm, no edema     The results of significant  diagnostics from this hospitalization (including imaging, microbiology, ancillary and laboratory) are listed below for reference.     Microbiology: No results found for this or any previous visit (from the past 240 hour(s)).   Labs: BNP (last 3 results) No results for input(s): BNP in the last 8760 hours. Basic Metabolic Panel:  Recent Labs Lab 08/10/17 1437  NA 138  K 3.6  CL 104  CO2 25  GLUCOSE 104*  BUN 14  CREATININE 0.61  CALCIUM 9.1  MG 1.9   Liver Function Tests:  Recent Labs Lab 08/10/17 1437  AST 18  ALT 11*  ALKPHOS 73  BILITOT 0.2*  PROT 7.7  ALBUMIN 4.1    Recent Labs Lab 08/10/17 1437  LIPASE 32   No results for input(s): AMMONIA in the last 168 hours. CBC:  Recent Labs Lab 08/10/17 1437  WBC 9.9  NEUTROABS 7.9*  HGB 12.8  HCT 39.8  MCV 85.6  PLT 226   Cardiac Enzymes:  Recent Labs Lab 08/10/17 1437 08/10/17 1854 08/10/17 2321 08/11/17 0622  TROPONINI 0.03* 0.03* 0.03* 0.03*   BNP: Invalid input(s): POCBNP CBG:  Recent Labs Lab 08/11/17 0506  GLUCAP 158*   D-Dimer  Recent Labs  08/11/17 0235  DDIMER 0.76*   Hgb A1c No results for input(s): HGBA1C in the last 72 hours. Lipid Profile No results  for input(s): CHOL, HDL, LDLCALC, TRIG, CHOLHDL, LDLDIRECT in the last 72 hours. Thyroid function studies No results for input(s): TSH, T4TOTAL, T3FREE, THYROIDAB in the last 72 hours.  Invalid input(s): FREET3 Anemia work up No results for input(s): VITAMINB12, FOLATE, FERRITIN, TIBC, IRON, RETICCTPCT in the last 72 hours. Urinalysis    Component Value Date/Time   COLORURINE YELLOW 08/10/2017 1437   APPEARANCEUR HAZY (A) 08/10/2017 1437   LABSPEC 1.014 08/10/2017 1437   PHURINE 7.0 08/10/2017 1437   GLUCOSEU NEGATIVE 08/10/2017 1437   HGBUR SMALL (A) 08/10/2017 1437   BILIRUBINUR NEGATIVE 08/10/2017 1437   KETONESUR NEGATIVE 08/10/2017 1437   PROTEINUR NEGATIVE 08/10/2017 1437   NITRITE NEGATIVE 08/10/2017 1437    LEUKOCYTESUR MODERATE (A) 08/10/2017 1437   Sepsis Labs Invalid input(s): PROCALCITONIN,  WBC,  LACTICIDVEN Microbiology No results found for this or any previous visit (from the past 240 hour(s)).   Time coordinating discharge: < 30 minutes  SIGNED:   Eddie North, MD  [facility] 08/11/2017, 4:44 PM Pager   If 7PM-7AM, please contact night-coverage [email or web address] Password [email or web address]

## 2017-08-11 NOTE — Care Management Obs Status (Signed)
MEDICARE OBSERVATION STATUS NOTIFICATION   Patient Details  Name: Diana [name] MRN: [id number] Date of Birth: 12-06-1933   Medicare Observation Status Notification Given:  Yes    Malcolm Metro, RN 08/11/2017, 11:15 AM

## 2017-08-11 NOTE — Progress Notes (Signed)
  Echocardiogram 2D Echocardiogram has been performed.  Pieter Partridge 08/11/2017, 9:54 AM

## 2017-08-16 DIAGNOSIS — E7800 Pure hypercholesterolemia, unspecified: Secondary | ICD-10-CM | POA: Diagnosis not present

## 2017-08-16 DIAGNOSIS — M545 Low back pain: Secondary | ICD-10-CM | POA: Diagnosis not present

## 2017-08-16 DIAGNOSIS — G4700 Insomnia, unspecified: Secondary | ICD-10-CM | POA: Diagnosis not present

## 2017-08-16 DIAGNOSIS — I82409 Acute embolism and thrombosis of unspecified deep veins of unspecified lower extremity: Secondary | ICD-10-CM | POA: Diagnosis not present

## 2017-08-16 DIAGNOSIS — Z299 Encounter for prophylactic measures, unspecified: Secondary | ICD-10-CM | POA: Diagnosis not present

## 2017-08-16 DIAGNOSIS — E8940 Asymptomatic postprocedural ovarian failure: Secondary | ICD-10-CM | POA: Diagnosis not present

## 2017-08-16 DIAGNOSIS — I10 Essential (primary) hypertension: Secondary | ICD-10-CM | POA: Diagnosis not present

## 2017-08-16 DIAGNOSIS — I2699 Other pulmonary embolism without acute cor pulmonale: Secondary | ICD-10-CM | POA: Diagnosis not present

## 2017-08-16 DIAGNOSIS — Z6838 Body mass index (BMI) 38.0-38.9, adult: Secondary | ICD-10-CM | POA: Diagnosis not present

## 2017-09-19 DIAGNOSIS — E7800 Pure hypercholesterolemia, unspecified: Secondary | ICD-10-CM | POA: Diagnosis not present

## 2017-09-19 DIAGNOSIS — Z6838 Body mass index (BMI) 38.0-38.9, adult: Secondary | ICD-10-CM | POA: Diagnosis not present

## 2017-09-19 DIAGNOSIS — I10 Essential (primary) hypertension: Secondary | ICD-10-CM | POA: Diagnosis not present

## 2017-09-19 DIAGNOSIS — Z2821 Immunization not carried out because of patient refusal: Secondary | ICD-10-CM | POA: Diagnosis not present

## 2017-09-19 DIAGNOSIS — I82409 Acute embolism and thrombosis of unspecified deep veins of unspecified lower extremity: Secondary | ICD-10-CM | POA: Diagnosis not present

## 2017-09-19 DIAGNOSIS — M1990 Unspecified osteoarthritis, unspecified site: Secondary | ICD-10-CM | POA: Diagnosis not present

## 2017-09-19 DIAGNOSIS — R350 Frequency of micturition: Secondary | ICD-10-CM | POA: Diagnosis not present

## 2017-09-19 DIAGNOSIS — Z299 Encounter for prophylactic measures, unspecified: Secondary | ICD-10-CM | POA: Diagnosis not present

## 2017-09-19 DIAGNOSIS — I739 Peripheral vascular disease, unspecified: Secondary | ICD-10-CM | POA: Diagnosis not present

## 2017-09-19 DIAGNOSIS — I2699 Other pulmonary embolism without acute cor pulmonale: Secondary | ICD-10-CM | POA: Diagnosis not present

## 2017-09-19 DIAGNOSIS — N390 Urinary tract infection, site not specified: Secondary | ICD-10-CM | POA: Diagnosis not present

## 2017-10-10 DIAGNOSIS — Z1231 Encounter for screening mammogram for malignant neoplasm of breast: Secondary | ICD-10-CM | POA: Diagnosis not present

## 2018-05-18 ENCOUNTER — Encounter (HOSPITAL_COMMUNITY): Payer: Self-pay | Admitting: Emergency Medicine

## 2018-05-18 ENCOUNTER — Emergency Department (HOSPITAL_COMMUNITY)
Admission: EM | Admit: 2018-05-18 | Discharge: 2018-05-18 | Disposition: A | Discharge status: Home / Self Care | Payer: Medicare HMO | Attending: Emergency Medicine | Admitting: Emergency Medicine

## 2018-05-18 ENCOUNTER — Other Ambulatory Visit: Payer: Self-pay

## 2018-05-18 ENCOUNTER — Emergency Department (HOSPITAL_COMMUNITY): Payer: Medicare HMO

## 2018-05-18 DIAGNOSIS — Y999 Unspecified external cause status: Secondary | ICD-10-CM | POA: Insufficient documentation

## 2018-05-18 DIAGNOSIS — M25532 Pain in left wrist: Secondary | ICD-10-CM

## 2018-05-18 DIAGNOSIS — Y929 Unspecified place or not applicable: Secondary | ICD-10-CM | POA: Diagnosis not present

## 2018-05-18 DIAGNOSIS — W01198A Fall on same level from slipping, tripping and stumbling with subsequent striking against other object, initial encounter: Secondary | ICD-10-CM | POA: Diagnosis not present

## 2018-05-18 DIAGNOSIS — S8991XA Unspecified injury of right lower leg, initial encounter: Secondary | ICD-10-CM | POA: Diagnosis present

## 2018-05-18 DIAGNOSIS — I10 Essential (primary) hypertension: Secondary | ICD-10-CM | POA: Insufficient documentation

## 2018-05-18 DIAGNOSIS — M25561 Pain in right knee: Secondary | ICD-10-CM | POA: Insufficient documentation

## 2018-05-18 DIAGNOSIS — Y9389 Activity, other specified: Secondary | ICD-10-CM | POA: Diagnosis not present

## 2018-05-18 DIAGNOSIS — Z87891 Personal history of nicotine dependence: Secondary | ICD-10-CM | POA: Insufficient documentation

## 2018-05-18 DIAGNOSIS — W19XXXA Unspecified fall, initial encounter: Secondary | ICD-10-CM

## 2018-05-18 MED ORDER — TRAMADOL HCL 50 MG PO TABS: 50.0000 mg | ORAL_TABLET | Freq: Four times a day (QID) | ORAL | 0 refills | Status: DC | PRN

## 2018-05-18 MED ORDER — TRAMADOL HCL 50 MG PO TABS
50.0000 mg | ORAL_TABLET | Freq: Once | ORAL | Status: AC
  Administered 2018-05-18: 50 mg via ORAL
  Filled 2018-05-18: qty 1

## 2018-05-18 MED ORDER — DOCUSATE SODIUM 100 MG PO CAPS: 100.0000 mg | ORAL_CAPSULE | Freq: Two times a day (BID) | ORAL | 0 refills | Status: AC | PRN

## 2018-05-18 NOTE — ED Provider Notes (Signed)
Emergency Department Provider Note   I have reviewed the triage vital signs and the nursing notes.   HISTORY  Chief Complaint Fall   HPI Diana [name] is a 84 y.o. female with PMH of DVT, HTN, and chronic right knee pain's to the emergency department for evaluation of right knee and left wrist pain after mechanical fall yesterday.  The patient states that she tripped over a vacuum cleaner right knee.  She tried to catch herself with her left hand and has been experiencing pain in both locations since.  Pain is worse with movement.  The patient is able to walk but has significant pain with doing so.  Denies any numbness or tingling.  No chest pain or difficulty breathing.  No head injury during the fall or loss of consciousness.  Patient receives injections in the right knee at baseline due to arthritis pain.  She took Motrin last night with no relief in symptoms.  Past Medical History:  Diagnosis Date  . Chronic deep vein thrombosis (DVT) of popliteal vein of left lower extremity (HCC)   . Chronic venous insufficiency   . DVT of lower extremity (deep venous thrombosis) (HCC)   . Enlarged heart   . Hypertension   . Hyperthyroidism   . PE (pulmonary thromboembolism) [facility])     Patient Active Problem List   Diagnosis Date Noted  . Nausea and vomiting in adult 08/11/2017  . Essential hypertension 08/11/2017  . Pre-syncope 08/10/2017  . Elevated troponin 08/10/2017  . DVT (deep venous thrombosis) (HCC) 08/10/2016  . Pulmonary embolism (HCC) 08/10/2016  . HTN (hypertension) 08/10/2016    Past Surgical History:  Procedure Laterality Date  . BREAST LUMPECTOMY    . TUBAL LIGATION      Allergies Patient has no known allergies.  History reviewed. No pertinent family history.  Social History Social History   Tobacco Use  . Smoking status: Former Games developer  . Smokeless tobacco: Never Used  Substance Use Topics  . Alcohol use: No  . Drug use: No    Review of  Systems  Constitutional: No fever/chills Eyes: No visual changes. ENT: No sore throat. Cardiovascular: Denies chest pain. Respiratory: Denies shortness of breath. Gastrointestinal: No abdominal pain.  No nausea, no vomiting.  No diarrhea.  No constipation. Genitourinary: Negative for dysuria. Musculoskeletal: Negative for back pain. Positive right knee and left wrist pain.  Skin: Negative for rash. Neurological: Negative for headaches, focal weakness or numbness.  10-point ROS otherwise negative.  ____________________________________________   PHYSICAL EXAM:  VITAL SIGNS: ED Triage Vitals  Enc Vitals Group     BP 05/18/18 1410 (!) 147/83     Pulse Rate 05/18/18 1410 83     Resp 05/18/18 1410 18     Temp 05/18/18 1410 98.1 F (36.7 C)     Temp Source 05/18/18 1410 Oral     SpO2 05/18/18 1410 98 %     Weight 05/18/18 1411 206 lb (93.4 kg)     Height 05/18/18 1411 5\' 3"  (1.6 m)     Pain Score 05/18/18 1411 8    Constitutional: Alert and oriented. Well appearing and in no acute distress. Eyes: Conjunctivae are normal.  Head: Atraumatic. Nose: No congestion/rhinnorhea. Mouth/Throat: Mucous membranes are moist.   Neck: No stridor. Cardiovascular: Normal rate, regular rhythm. Good peripheral circulation. Grossly normal heart sounds.   Respiratory: Normal respiratory effort.  No retractions. Lungs CTAB. Gastrointestinal: No distention.  Musculoskeletal: Pain with palpation of the right knee. Mild anterior abrasion  noted. Limited ability to fully extend the right knee. Normal flexion. Normal ROM of the right hip. Able to ambulate but with limp and requiring assistance. Left wrist with no snuff box tenderness. No laceration or abrasion. Minimal swelling. No redness or warmth. Mild decreased ROM.  Neurologic:  Normal speech and language. No gross focal neurologic deficits are appreciated.  Skin:  Skin is warm, dry and intact. No rash  noted. ____________________________________________  RADIOLOGY  Dg Wrist Complete Left  Result Date: 05/18/2018 CLINICAL DATA:  Left wrist pain after fall yesterday. EXAM: LEFT WRIST - COMPLETE 3+ VIEW COMPARISON:  03/06/2013 FINDINGS: Volar fixation plate with screws intact over the distal radius. No acute fracture or dislocation. Degenerative changes over the wrist and first carpometacarpal joints. IMPRESSION: No acute findings. Electronically Signed   By: Elberta Fortisaniel  Boyle M.D.   On: 05/18/2018 15:37   Dg Knee Complete 4 Views Right  Result Date: 05/18/2018 CLINICAL DATA:  Right knee pain after fall yesterday. EXAM: RIGHT KNEE - COMPLETE 4+ VIEW COMPARISON:  None. FINDINGS: Diffuse osteopenia. Mild tricompartmental osteoarthritic change is present. No acute fracture or dislocation. No significant joint effusion. IMPRESSION: No acute findings. Mild osteoarthritic change. Electronically Signed   By: Elberta Fortisaniel  Boyle M.D.   On: 05/18/2018 15:36    ____________________________________________   PROCEDURES  Procedure(s) performed:   Procedures  None ____________________________________________   INITIAL IMPRESSION / ASSESSMENT AND PLAN / ED COURSE  Pertinent labs & imaging results that were available during my care of the patient were reviewed by me and considered in my medical decision making (see chart for details).  Patient presents to the emergency department with right knee and left wrist pain after mechanical fall yesterday.  She is able to ambulate with significant pain.  Normal range of motion of the right hip.  The right knee is swollen and has arthritis at baseline.  Left wrist with no concern clinically for scaphoid injury but some diffuse discomfort to palpation.  Plan for plain film of the left wrist along with ice.  Patient given tramadol for pain.  03:45 PM Patient feeling better after tramadol.  Plain films reviewed with no acute findings.  Patient has a walker at home.  I  encouraged her to use this, at least temporarily, to assist her with walking.  Family at bedside will make sure she uses this.  Provided contact information for outpatient orthopedic surgery.  Advised she call the provider group performing injections on her right knee to make them aware of the pain and swelling in the setting of normal x-ray.  No evidence to suggest DVT.   At this time, I do not feel there is any life-threatening condition present. I have reviewed and discussed all results (EKG, imaging, lab, urine as appropriate), exam findings with patient. I have reviewed nursing notes and appropriate previous records.  I feel the patient is safe to be discharged home without further emergent workup. Discussed usual and customary return precautions. Patient and family (if present) verbalize understanding and are comfortable with this plan.  Patient will follow-up with their primary care provider. If they do not have a primary care provider, information for follow-up has been provided to them. All questions have been answered.  ____________________________________________  FINAL CLINICAL IMPRESSION(S) / ED DIAGNOSES  Final diagnoses:  Fall, initial encounter  Acute pain of right knee  Left wrist pain     MEDICATIONS GIVEN DURING THIS VISIT:  Medications  traMADol (ULTRAM) tablet 50 mg (50 mg Oral Given 05/18/18  1438)     NEW OUTPATIENT MEDICATIONS STARTED DURING THIS VISIT:  New Prescriptions   DOCUSATE SODIUM (COLACE) 100 MG CAPSULE    Take 1 capsule (100 mg total) by mouth 2 (two) times daily as needed for up to 14 days for mild constipation.   TRAMADOL (ULTRAM) 50 MG TABLET    Take 1 tablet (50 mg total) by mouth every 6 (six) hours as needed.    Note:  This document was prepared using Dragon voice recognition software and may include unintentional dictation errors.  Alona Bene, MD Emergency Medicine    Vignesh Willert, Arlyss Repress, MD 05/18/18 [phone]

## 2018-05-18 NOTE — ED Triage Notes (Signed)
Pt tripped and fell yesterday and is c/o of right knee pain and left wrist. Denies hitting head

## 2018-05-18 NOTE — ED Notes (Signed)
EDP at bedside  

## 2018-05-18 NOTE — Discharge Instructions (Signed)

## 2018-08-05 ENCOUNTER — Other Ambulatory Visit: Payer: Self-pay

## 2018-08-05 ENCOUNTER — Emergency Department (HOSPITAL_COMMUNITY)
Admission: EM | Admit: 2018-08-05 | Discharge: 2018-08-05 | Disposition: A | Discharge status: Home / Self Care | Payer: Medicare HMO | Attending: Emergency Medicine | Admitting: Emergency Medicine

## 2018-08-05 ENCOUNTER — Emergency Department (HOSPITAL_COMMUNITY): Payer: Medicare HMO

## 2018-08-05 ENCOUNTER — Encounter (HOSPITAL_COMMUNITY): Payer: Self-pay | Admitting: Emergency Medicine

## 2018-08-05 DIAGNOSIS — Z79899 Other long term (current) drug therapy: Secondary | ICD-10-CM | POA: Insufficient documentation

## 2018-08-05 DIAGNOSIS — Z7982 Long term (current) use of aspirin: Secondary | ICD-10-CM | POA: Insufficient documentation

## 2018-08-05 DIAGNOSIS — Z87891 Personal history of nicotine dependence: Secondary | ICD-10-CM | POA: Insufficient documentation

## 2018-08-05 DIAGNOSIS — I10 Essential (primary) hypertension: Secondary | ICD-10-CM | POA: Insufficient documentation

## 2018-08-05 DIAGNOSIS — R42 Dizziness and giddiness: Secondary | ICD-10-CM | POA: Diagnosis not present

## 2018-08-05 LAB — COMPREHENSIVE METABOLIC PANEL
ALT: 12 U/L (ref 0–44)
AST: 18 U/L (ref 15–41)
Albumin: 4.2 g/dL (ref 3.5–5.0)
Alkaline Phosphatase: 84 U/L (ref 38–126)
Anion gap: 8 (ref 5–15)
BILIRUBIN TOTAL: 0.6 mg/dL (ref 0.3–1.2)
BUN: 14 mg/dL (ref 8–23)
CHLORIDE: 109 mmol/L (ref 98–111)
CO2: 26 mmol/L (ref 22–32)
CREATININE: 0.69 mg/dL (ref 0.44–1.00)
Calcium: 9.2 mg/dL (ref 8.9–10.3)
Glucose, Bld: 95 mg/dL (ref 70–99)
POTASSIUM: 4.2 mmol/L (ref 3.5–5.1)
Sodium: 143 mmol/L (ref 135–145)
TOTAL PROTEIN: 7.8 g/dL (ref 6.5–8.1)

## 2018-08-05 LAB — CBC WITH DIFFERENTIAL/PLATELET
BASOS ABS: 0 10*3/uL (ref 0.0–0.1)
Basophils Relative: 0 %
EOS ABS: 0.1 10*3/uL (ref 0.0–0.7)
EOS PCT: 2 %
HCT: 39.1 % (ref 36.0–46.0)
Hemoglobin: 12.3 g/dL (ref 12.0–15.0)
Lymphocytes Relative: 23 %
Lymphs Abs: 1.9 10*3/uL (ref 0.7–4.0)
MCH: 27.7 pg (ref 26.0–34.0)
MCHC: 31.5 g/dL (ref 30.0–36.0)
MCV: 88.1 fL (ref 78.0–100.0)
Monocytes Absolute: 0.6 10*3/uL (ref 0.1–1.0)
Monocytes Relative: 7 %
Neutro Abs: 5.6 10*3/uL (ref 1.7–7.7)
Neutrophils Relative %: 68 %
PLATELETS: 225 10*3/uL (ref 150–400)
RBC: 4.44 MIL/uL (ref 3.87–5.11)
RDW: 15 % (ref 11.5–15.5)
WBC: 8.2 10*3/uL (ref 4.0–10.5)

## 2018-08-05 LAB — TROPONIN I: Troponin I: 0.03 ng/mL (ref <0.03)

## 2018-08-05 MED ORDER — SODIUM CHLORIDE 0.9 % IV BOLUS
500.0000 mL | Freq: Once | INTRAVENOUS | Status: AC
Start: 2018-08-05 — End: 2018-08-05
  Administered 2018-08-05: 500 mL via INTRAVENOUS

## 2018-08-05 MED ORDER — METOPROLOL SUCCINATE ER 25 MG PO TB24
12.5000 mg | ORAL_TABLET | Freq: Every day | ORAL | Status: DC
  Administered 2018-08-05: 12.5 mg via ORAL
  Filled 2018-08-05: qty 1

## 2018-08-05 MED ORDER — METOPROLOL SUCCINATE ER 25 MG PO TB24: 12.5000 mg | ORAL_TABLET | Freq: Every day | ORAL | 0 refills | Status: DC

## 2018-08-05 NOTE — ED Notes (Signed)
EDP at bedside  

## 2018-08-05 NOTE — ED Provider Notes (Signed)
[facility] EMERGENCY DEPARTMENT Provider Note   CSN: [id number] Arrival date & time: 08/05/18  1106     History   Chief Complaint Chief Complaint  Patient presents with  . Dizziness    HPI Diana [name] is a 84 y.o. female.  Patient complains of some dizziness recently.  She states her room is not spinning she feels lightheaded sometimes  The history is provided by the patient. No language interpreter was used.  Illness  This is a recurrent problem. The current episode started 2 days ago. The problem occurs rarely. The problem has been resolved. Pertinent negatives include no chest pain, no abdominal pain and no headaches. Nothing aggravates the symptoms. Nothing relieves the symptoms. She has tried nothing for the symptoms. The treatment provided no relief.    Past Medical History:  Diagnosis Date  . Chronic deep vein thrombosis (DVT) of popliteal vein of left lower extremity (HCC)   . Chronic venous insufficiency   . DVT of lower extremity (deep venous thrombosis) (HCC)   . Enlarged heart   . Hypertension   . Hyperthyroidism   . PE (pulmonary thromboembolism) [facility])     Patient Active Problem List   Diagnosis Date Noted  . Nausea and vomiting in adult 08/11/2017  . Essential hypertension 08/11/2017  . Pre-syncope 08/10/2017  . Elevated troponin 08/10/2017  . DVT (deep venous thrombosis) (HCC) 08/10/2016  . Pulmonary embolism (HCC) 08/10/2016  . HTN (hypertension) 08/10/2016    Past Surgical History:  Procedure Laterality Date  . BREAST LUMPECTOMY    . TUBAL LIGATION       OB History   None      Home Medications    Prior to Admission medications   Medication Sig Start Date End Date Taking? Authorizing Provider  acetaminophen (TYLENOL) 325 MG tablet Take 2 tablets (650 mg total) by mouth every 6 (six) hours as needed for mild pain (or Fever >/= 101). 08/12/16  Yes Filbert Schilder, MD  amLODipine (NORVASC) 5 MG tablet Take 5 mg by mouth every  evening. 07/14/17  Yes [provider]  aspirin EC 81 MG tablet Take 81 mg by mouth daily.   Yes [provider]  ibuprofen (ADVIL,MOTRIN) 800 MG tablet Take 1 tablet by mouth 3 (three) times daily as needed. For pain 07/14/17  Yes [provider]  latanoprost (XALATAN) 0.005 % ophthalmic solution Place 1 drop into both eyes at bedtime. 07/14/17  Yes [provider]  lisinopril (PRINIVIL,ZESTRIL) 20 MG tablet Take 20 mg by mouth every evening.  06/11/16  Yes [provider]  meclizine (ANTIVERT) 12.5 MG tablet Take 12.5 mg by mouth 3 (three) times daily as needed for dizziness or nausea.  07/14/16  Yes [provider]  traMADol (ULTRAM) 50 MG tablet Take 1 tablet (50 mg total) by mouth every 6 (six) hours as needed. 05/18/18  Yes Long, Arlyss Repress, MD  Vitamin D, Ergocalciferol, (DRISDOL) 50000 units CAPS capsule Take 50,000 Units by mouth daily.   Yes [provider]  metoprolol succinate (TOPROL-XL) 25 MG 24 hr tablet Take 0.5 tablets (12.5 mg total) by mouth daily. 08/05/18   Bethann Berkshire, MD    Family History No family history on file.  Social History Social History   Tobacco Use  . Smoking status: Former Games developer  . Smokeless tobacco: Never Used  Substance Use Topics  . Alcohol use: No  . Drug use: No     Allergies   Patient has no known allergies.  Review of Systems Review of Systems  Constitutional: Negative for appetite change and fatigue.  HENT: Negative for congestion, ear discharge and sinus pressure.   Eyes: Negative for discharge.  Respiratory: Negative for cough.   Cardiovascular: Negative for chest pain.  Gastrointestinal: Negative for abdominal pain and diarrhea.  Genitourinary: Negative for frequency and hematuria.  Musculoskeletal: Negative for back pain.  Skin: Negative for rash.  Neurological: Positive for light-headedness. Negative for seizures and headaches.  Psychiatric/Behavioral: Negative for  hallucinations.     Physical Exam Updated Vital Signs BP (!) 125/113   Pulse (!) 34   Resp 14   Ht 5' 3.5" (1.613 m)   Wt 93 kg   SpO2 100%   BMI 35.74 kg/m   Physical Exam  Constitutional: She is oriented to person, place, and time. She appears well-developed.  HENT:  Head: Normocephalic.  Eyes: Conjunctivae and EOM are normal. No scleral icterus.  Neck: Neck supple. No thyromegaly present.  Cardiovascular: Exam reveals no gallop and no friction rub.  No murmur heard. Irregular rapid heartbeat  Pulmonary/Chest: No stridor. She has no wheezes. She has no rales. She exhibits no tenderness.  Abdominal: She exhibits no distension. There is no tenderness. There is no rebound.  Musculoskeletal: Normal range of motion. She exhibits no edema.  Lymphadenopathy:    She has no cervical adenopathy.  Neurological: She is oriented to person, place, and time. She exhibits normal muscle tone. Coordination normal.  Skin: No rash noted. No erythema.  Psychiatric: She has a normal mood and affect. Her behavior is normal.     ED Treatments / Results  Labs (all labs ordered are listed, but only abnormal results are displayed) Labs Reviewed  TROPONIN I - Abnormal; Notable for the following components:      Result Value   Troponin I 0.03 (*)    All other components within normal limits  CBC WITH DIFFERENTIAL/PLATELET  COMPREHENSIVE METABOLIC PANEL    EKG EKG Interpretation  Date/Time:  Saturday August 05 2018 11:21:55 EDT Ventricular Rate:  82 PR Interval:    QRS Duration: 73 QT Interval:  357 QTC Calculation: 420 R Axis:   57 Text Interpretation:  Sinus rhythm Atrial premature complexes in couplets Confirmed by Bethann BerkshireZammit, Demetria Lightsey [phone]([id number]) on 08/05/2018 1:31:00 PM   Radiology Dg Chest Portable 1 View  Result Date: 08/05/2018 CLINICAL DATA:  Weakness EXAM: PORTABLE CHEST 1 VIEW COMPARISON:  08/10/2017 FINDINGS: Lungs are clear.  No pleural effusion or pneumothorax. Mild  cardiomegaly. IMPRESSION: No evidence of acute cardiopulmonary disease. Electronically Signed   By: Charline BillsSriyesh  Krishnan M.D.   On: 08/05/2018 12:03    Procedures Procedures (including critical care time)  Medications Ordered in ED Medications  metoprolol succinate (TOPROL-XL) 24 hr tablet 12.5 mg (12.5 mg Oral Given 08/05/18 1402)  sodium chloride 0.9 % bolus 500 mL ( Intravenous Stopped 08/05/18 1311)     Initial Impression / Assessment and Plan / ED Course  I have reviewed the triage vital signs and the nursing notes.  Pertinent labs & imaging results that were available during my care of the patient were reviewed by me and considered in my medical decision making (see chart for details).     Patient's labs are unremarkable except for chronically elevated troponin at 0.  03.  6 months ago patient was discharged on 12.5 mg of Toprol XL daily.  She has stopped this medicine but does not know why or when.  Patient has a rapid heart rate and we will  place her back on the Toprol 12.5 mg.  I suspect this might have some the do with why she is been dizzy.  She will follow-up with her PCP  Final Clinical Impressions(s) / ED Diagnoses   Final diagnoses:  Dizziness    ED Discharge Orders         Ordered    metoprolol succinate (TOPROL-XL) 25 MG 24 hr tablet  Daily     08/05/18 1404           Bethann Berkshire, MD 08/05/18 1409

## 2018-08-05 NOTE — ED Notes (Signed)
EDP at bedside updating patient and family. 

## 2018-08-05 NOTE — Discharge Instructions (Addendum)
Follow-up with your doctor this week for recheck. 

## 2018-08-05 NOTE — ED Triage Notes (Signed)
Patient c/o intermittent dizziness and generalized weakness. Denies any neurological deficits. Denies any headache or chest pain. Per patient intermittent spells "for a while." (x1 week)

## 2018-08-05 NOTE — ED Notes (Signed)
X-ray at bedside

## 2018-08-05 NOTE — ED Notes (Signed)
Date and time results received: 08/05/18 12:35 PM  (use smartphrase ".now" to insert current time)  Test: Troponin Critical Value: 0.03  Name of Provider Notified: Zammit  Orders Received? Or Actions Taken?: Orders Received - See Orders for details

## 2018-08-14 DIAGNOSIS — Z7901 Long term (current) use of anticoagulants: Secondary | ICD-10-CM | POA: Insufficient documentation

## 2018-08-14 DIAGNOSIS — E669 Obesity, unspecified: Secondary | ICD-10-CM | POA: Insufficient documentation

## 2019-01-18 ENCOUNTER — Inpatient Hospital Stay (HOSPITAL_COMMUNITY)
Admission: EM | Admit: 2019-01-18 | Discharge: 2019-01-23 | DRG: 562 | Disposition: A | Discharge status: Skilled Nursing Facility | Payer: Medicare HMO | Attending: Internal Medicine | Admitting: Internal Medicine

## 2019-01-18 ENCOUNTER — Other Ambulatory Visit: Payer: Self-pay

## 2019-01-18 ENCOUNTER — Emergency Department (HOSPITAL_COMMUNITY): Payer: Medicare HMO

## 2019-01-18 ENCOUNTER — Encounter (HOSPITAL_COMMUNITY): Payer: Self-pay | Admitting: Emergency Medicine

## 2019-01-18 DIAGNOSIS — I4891 Unspecified atrial fibrillation: Secondary | ICD-10-CM

## 2019-01-18 DIAGNOSIS — I10 Essential (primary) hypertension: Secondary | ICD-10-CM | POA: Diagnosis present

## 2019-01-18 DIAGNOSIS — Z791 Long term (current) use of non-steroidal anti-inflammatories (NSAID): Secondary | ICD-10-CM

## 2019-01-18 DIAGNOSIS — Z7901 Long term (current) use of anticoagulants: Secondary | ICD-10-CM

## 2019-01-18 DIAGNOSIS — W109XXA Fall (on) (from) unspecified stairs and steps, initial encounter: Secondary | ICD-10-CM | POA: Diagnosis present

## 2019-01-18 DIAGNOSIS — I517 Cardiomegaly: Secondary | ICD-10-CM | POA: Diagnosis present

## 2019-01-18 DIAGNOSIS — M25561 Pain in right knee: Secondary | ICD-10-CM | POA: Diagnosis not present

## 2019-01-18 DIAGNOSIS — S82401A Unspecified fracture of shaft of right fibula, initial encounter for closed fracture: Secondary | ICD-10-CM

## 2019-01-18 DIAGNOSIS — T148XXA Other injury of unspecified body region, initial encounter: Secondary | ICD-10-CM | POA: Diagnosis present

## 2019-01-18 DIAGNOSIS — D62 Acute posthemorrhagic anemia: Secondary | ICD-10-CM | POA: Diagnosis present

## 2019-01-18 DIAGNOSIS — E0590 Thyrotoxicosis, unspecified without thyrotoxic crisis or storm: Secondary | ICD-10-CM | POA: Diagnosis present

## 2019-01-18 DIAGNOSIS — S82201A Unspecified fracture of shaft of right tibia, initial encounter for closed fracture: Secondary | ICD-10-CM

## 2019-01-18 DIAGNOSIS — I2699 Other pulmonary embolism without acute cor pulmonale: Secondary | ICD-10-CM | POA: Diagnosis present

## 2019-01-18 DIAGNOSIS — Z86711 Personal history of pulmonary embolism: Secondary | ICD-10-CM

## 2019-01-18 DIAGNOSIS — Z79899 Other long term (current) drug therapy: Secondary | ICD-10-CM

## 2019-01-18 DIAGNOSIS — S82121D Displaced fracture of lateral condyle of right tibia, subsequent encounter for closed fracture with routine healing: Secondary | ICD-10-CM

## 2019-01-18 DIAGNOSIS — I82532 Chronic embolism and thrombosis of left popliteal vein: Secondary | ICD-10-CM | POA: Diagnosis present

## 2019-01-18 DIAGNOSIS — I482 Chronic atrial fibrillation, unspecified: Secondary | ICD-10-CM | POA: Diagnosis present

## 2019-01-18 DIAGNOSIS — S82141A Displaced bicondylar fracture of right tibia, initial encounter for closed fracture: Principal | ICD-10-CM | POA: Diagnosis present

## 2019-01-18 DIAGNOSIS — I872 Venous insufficiency (chronic) (peripheral): Secondary | ICD-10-CM | POA: Diagnosis present

## 2019-01-18 DIAGNOSIS — Z87891 Personal history of nicotine dependence: Secondary | ICD-10-CM

## 2019-01-18 DIAGNOSIS — S82491A Other fracture of shaft of right fibula, initial encounter for closed fracture: Secondary | ICD-10-CM | POA: Diagnosis present

## 2019-01-18 DIAGNOSIS — I82409 Acute embolism and thrombosis of unspecified deep veins of unspecified lower extremity: Secondary | ICD-10-CM | POA: Diagnosis present

## 2019-01-18 LAB — CBC WITH DIFFERENTIAL/PLATELET
Abs Immature Granulocytes: 0.04 10*3/uL (ref 0.00–0.07)
Basophils Absolute: 0 10*3/uL (ref 0.0–0.1)
Basophils Relative: 0 %
Eosinophils Absolute: 0 10*3/uL (ref 0.0–0.5)
Eosinophils Relative: 0 %
HCT: 40.9 % (ref 36.0–46.0)
Hemoglobin: 12.6 g/dL (ref 12.0–15.0)
Immature Granulocytes: 0 %
Lymphocytes Relative: 16 %
Lymphs Abs: 1.9 10*3/uL (ref 0.7–4.0)
MCH: 27.8 pg (ref 26.0–34.0)
MCHC: 30.8 g/dL (ref 30.0–36.0)
MCV: 90.3 fL (ref 80.0–100.0)
Monocytes Absolute: 0.7 10*3/uL (ref 0.1–1.0)
Monocytes Relative: 6 %
Neutro Abs: 9.6 10*3/uL — ABNORMAL HIGH (ref 1.7–7.7)
Neutrophils Relative %: 78 %
Platelets: 249 10*3/uL (ref 150–400)
RBC: 4.53 MIL/uL (ref 3.87–5.11)
RDW: 15.3 % (ref 11.5–15.5)
WBC: 12.3 10*3/uL — ABNORMAL HIGH (ref 4.0–10.5)
nRBC: 0 % (ref 0.0–0.2)

## 2019-01-18 MED ORDER — FENTANYL CITRATE (PF) 100 MCG/2ML IJ SOLN
50.0000 ug | Freq: Once | INTRAMUSCULAR | Status: AC
  Administered 2019-01-18: 50 ug via INTRAVENOUS
  Filled 2019-01-18: qty 2

## 2019-01-18 MED ORDER — ACETAMINOPHEN 500 MG PO TABS
1000.0000 mg | ORAL_TABLET | Freq: Once | ORAL | Status: AC
  Administered 2019-01-18: 1000 mg via ORAL
  Filled 2019-01-18: qty 2

## 2019-01-18 NOTE — ED Provider Notes (Signed)
[facility] EMERGENCY DEPARTMENT Provider Note   CSN: [id number] Arrival date & time: 01/18/19  1946    History   Chief Complaint Chief Complaint  Patient presents with  . Fall    HPI Diana [name] is a 85 y.o. female with hx of DVT and PE currently taking Xarelto who presents to the ED with right knee pain. Patient tripped and fell down 2 steps and landed on the right knee. The steps were wood. Patient thinks her heel got caught in the step. Patient denies head injury or LOC. She denies any other injuries.      The history is provided by the patient. No language interpreter was used.  Fall  This is a new problem. The current episode started 1 to 2 hours ago. The problem has been gradually worsening. Pertinent negatives include no chest pain, no abdominal pain, no headaches and no shortness of breath. Nothing relieves the symptoms. She has tried nothing for the symptoms.  Knee Pain  Location:  Knee Injury: yes   Mechanism of injury: fall   Fall:    Fall occurred:  Down stairs   Impact surface:  Hard floor   Point of impact:  Knees Knee location:  R knee Pain details:    Quality:  Shooting and sharp   Radiates to:  R leg   Timing:  Constant   Progression:  Worsening Chronicity:  New Foreign body present:  No foreign bodies Relieved by:  Nothing Worsened by:  Extension and flexion Ineffective treatments:  None tried Associated symptoms: decreased ROM   Associated symptoms: no neck pain     Past Medical History:  Diagnosis Date  . Chronic deep vein thrombosis (DVT) of popliteal vein of left lower extremity (HCC)   . Chronic venous insufficiency   . DVT of lower extremity (deep venous thrombosis) (HCC)   . Enlarged heart   . Hypertension   . Hyperthyroidism   . PE (pulmonary thromboembolism) [facility])     Patient Active Problem List   Diagnosis Date Noted  . Fracture 01/19/2019  . Nausea and vomiting in adult 08/11/2017  . Essential hypertension 08/11/2017    . Pre-syncope 08/10/2017  . Elevated troponin 08/10/2017  . DVT (deep venous thrombosis) (HCC) 08/10/2016  . Pulmonary embolism (HCC) 08/10/2016  . HTN (hypertension) 08/10/2016    Past Surgical History:  Procedure Laterality Date  . BREAST LUMPECTOMY    . TUBAL LIGATION       OB History   No obstetric history on file.      Home Medications    Prior to Admission medications   Medication Sig Start Date End Date Taking? Authorizing Provider  amLODipine (NORVASC) 5 MG tablet Take 5 mg by mouth every evening. 07/14/17  Yes [provider]  cephALEXin (KEFLEX) 250 MG capsule Take 250 mg by mouth at bedtime. 01/03/19  Yes [provider]  cholecalciferol (VITAMIN D3) 25 MCG (1000 UT) tablet Take 1,000 Units by mouth daily.   Yes [provider]  latanoprost (XALATAN) 0.005 % ophthalmic solution Place 1 drop into both eyes at bedtime. 07/14/17  Yes [provider]  lisinopril (PRINIVIL,ZESTRIL) 20 MG tablet Take 20 mg by mouth every evening.  06/11/16  Yes [provider]  meclizine (ANTIVERT) 12.5 MG tablet Take 12.5 mg by mouth 3 (three) times daily as needed for dizziness or nausea.  07/14/16  Yes [provider]  metoprolol succinate (TOPROL-XL) 25 MG 24 hr tablet Take 0.5 tablets (12.5 mg total)  by mouth daily. Patient taking differently: Take 25 mg by mouth every morning.  08/05/18  Yes Bethann Berkshire, MD  rivaroxaban (XARELTO) 20 MG TABS tablet Take 20 mg by mouth daily.   Yes [provider]    Family History History reviewed. No pertinent family history.  Social History Social History   Tobacco Use  . Smoking status: Former Games developer  . Smokeless tobacco: Never Used  Substance Use Topics  . Alcohol use: No  . Drug use: No     Allergies   Patient has no known allergies.   Review of Systems Review of Systems  Constitutional: Negative for diaphoresis.  HENT: Negative.   Eyes: Negative for visual  disturbance.  Respiratory: Negative for chest tightness and shortness of breath.   Cardiovascular: Negative for chest pain.  Gastrointestinal: Negative for abdominal pain, nausea and vomiting.  Genitourinary: Negative for dysuria and frequency.  Musculoskeletal: Positive for arthralgias, gait problem and joint swelling. Negative for neck pain.  Skin: Negative for wound.  Neurological: Negative for headaches.  Psychiatric/Behavioral: Negative for confusion.     Physical Exam Updated Vital Signs BP (!) 156/100 (BP Location: Left Arm)   Pulse (!) 105   Temp (!) 97.5 F (36.4 C) (Oral)   Resp 20   Ht 5' 3.5" (1.613 m)   Wt 93 kg   SpO2 98%   BMI 35.74 kg/m   Physical Exam Vitals signs and nursing note reviewed.  Constitutional:      Appearance: She is well-developed.  HENT:     Head: Normocephalic and atraumatic.     Mouth/Throat:     Mouth: Mucous membranes are moist.  Eyes:     Extraocular Movements: Extraocular movements intact.     Conjunctiva/sclera: Conjunctivae normal.  Neck:     Musculoskeletal: Neck supple.  Cardiovascular:     Rate and Rhythm: Tachycardia present.     Pulses: Normal pulses.  Pulmonary:     Effort: Pulmonary effort is normal.  Abdominal:     Palpations: Abdomen is soft.     Tenderness: There is no abdominal tenderness.  Musculoskeletal:     Right knee: She exhibits decreased range of motion, swelling and ecchymosis. She exhibits no deformity and no laceration. Tenderness found.     Comments: Right knee and lower leg with swelling, tenderness and decreased range of motion. Patient has pain with any movement of the knee. Pedal pulse 2+. No compartment syndrome at this time.   Skin:    General: Skin is warm and dry.  Neurological:     Mental Status: She is alert and oriented to person, place, and time.  Psychiatric:        Mood and Affect: Mood normal.      ED Treatments / Results  Labs (all labs ordered are listed, but only abnormal  results are displayed) Labs Reviewed  CBC WITH DIFFERENTIAL/PLATELET - Abnormal; Notable for the following components:      Result Value   WBC 12.3 (*)    Neutro Abs 9.6 (*)    All other components within normal limits  BASIC METABOLIC PANEL  URINALYSIS, ROUTINE W REFLEX MICROSCOPIC    Radiology Dg Knee Complete 4 Views Right  Result Date: 01/18/2019 CLINICAL DATA:  Fall, right knee pain EXAM: RIGHT KNEE - COMPLETE 4+ VIEW COMPARISON:  07/18/2018 FINDINGS: Fracture through the right proximal tibial metaphysis, nondisplaced. A vertical component may extend into the lateral tibial plateau. Fracture noted through the fibular neck and proximal fibular shaft. No  joint effusion. Moderate tricompartment degenerative changes within the right knee. IMPRESSION: Proximal right tibial metaphyseal fracture with possible extension into the lateral plateau. Fibular neck and proximal fibular shaft fracture. Electronically Signed   By: Charlett Nose M.D.   On: 01/18/2019 21:14    Procedures Procedures (including critical care time)  Medications Ordered in ED Medications  acetaminophen (TYLENOL) tablet 1,000 mg (1,000 mg Oral Given 01/18/19 2328)  fentaNYL (SUBLIMAZE) injection 50 mcg (50 mcg Intravenous Given 01/18/19 2330)  fentaNYL (SUBLIMAZE) injection 50 mcg (50 mcg Intravenous Given 01/19/19 0039)  11:10 pmConsult with Dr. Romeo Apple, knee immobilizer, request for hospitalist to admit patient. 12:20 am Dr. Lajean Saver in to see the patient 12:30 am consult with admitting MD and she will admit the patient.   Initial Impression / Assessment and Plan / ED Course  I have reviewed the triage vital signs and the nursing notes.  85 y.o. female here with right knee pain and swelling s/p fall. Will admit for pain management and observation of swelling of the right leg and any signs of compartment syndrome.   Final Clinical Impressions(s) / ED Diagnoses   Final diagnoses:  Tibia/fibula fracture, right, closed,  initial encounter  Tibial plateau fracture, right, closed, initial encounter    ED Discharge Orders    None       Kerrie Buffalo [place], [place] 01/19/19 0044    Glynn Octave, MD 01/19/19 [phone]

## 2019-01-18 NOTE — ED Triage Notes (Signed)
Pt c/o right knee pain after tripping down 2 steps.

## 2019-01-18 NOTE — Progress Notes (Signed)
Patient ID: Diana [name], female   DOB: [birth year], 85 y.o.   MRN: [id number] 85 yo h/o dvt 3 yrs ago still on xarelto and   Past Medical History:  Diagnosis Date  . Chronic deep vein thrombosis (DVT) of popliteal vein of left lower extremity (HCC)   . Chronic venous insufficiency   . DVT of lower extremity (deep venous thrombosis) (HCC)   . Enlarged heart   . Hypertension   . Hyperthyroidism   . PE (pulmonary thromboembolism) (HCC)     Fell down the stairs fractured her proximal tibial metaphysis, (tibial plateau equivalent) although the fractures appear non surgical, it is a non weight bearing fracture (for 6 weeks). She will be admitted for observation, inabiltiy to bear weight, PT.  If she develops a compartment syndrome, it is questionable whether it would be appropriate to operate at this facility.   I would stop the xarelto and place her on heparin, consult vascular surgery in the am

## 2019-01-19 DIAGNOSIS — S82201A Unspecified fracture of shaft of right tibia, initial encounter for closed fracture: Secondary | ICD-10-CM

## 2019-01-19 DIAGNOSIS — T148XXA Other injury of unspecified body region, initial encounter: Secondary | ICD-10-CM | POA: Diagnosis not present

## 2019-01-19 DIAGNOSIS — S82401A Unspecified fracture of shaft of right fibula, initial encounter for closed fracture: Secondary | ICD-10-CM

## 2019-01-19 DIAGNOSIS — I2699 Other pulmonary embolism without acute cor pulmonale: Secondary | ICD-10-CM

## 2019-01-19 DIAGNOSIS — S82141A Displaced bicondylar fracture of right tibia, initial encounter for closed fracture: Principal | ICD-10-CM

## 2019-01-19 DIAGNOSIS — I10 Essential (primary) hypertension: Secondary | ICD-10-CM

## 2019-01-19 LAB — BASIC METABOLIC PANEL
Anion gap: 9 (ref 5–15)
BUN: 12 mg/dL (ref 8–23)
CO2: 25 mmol/L (ref 22–32)
Calcium: 9.4 mg/dL (ref 8.9–10.3)
Chloride: 108 mmol/L (ref 98–111)
Creatinine, Ser: 0.65 mg/dL (ref 0.44–1.00)
GFR calc Af Amer: >60 mL/min (ref >60)
GFR calc non Af Amer: >60 mL/min (ref >60)
Glucose, Bld: 97 mg/dL (ref 70–99)
POTASSIUM: 3.9 mmol/L (ref 3.5–5.1)
Sodium: 142 mmol/L (ref 135–145)

## 2019-01-19 LAB — HEPARIN LEVEL (UNFRACTIONATED)
HEPARIN UNFRACTIONATED: 1.22 [IU]/mL — AB (ref 0.30–0.70)
Heparin Unfractionated: 1.06 IU/mL — ABNORMAL HIGH (ref 0.30–0.70)

## 2019-01-19 LAB — APTT: aPTT: 32 seconds (ref 24–36)

## 2019-01-19 MED ORDER — LATANOPROST 0.005 % OP SOLN
1.0000 [drp] | Freq: Every day | OPHTHALMIC | Status: DC
  Administered 2019-01-19 – 2019-01-22 (×5): 1 [drp] via OPHTHALMIC
  Filled 2019-01-19 (×3): qty 2.5

## 2019-01-19 MED ORDER — HEPARIN (PORCINE) 25000 UT/250ML-% IV SOLN
1300.0000 [IU]/h | INTRAVENOUS | Status: DC
  Administered 2019-01-19: 1300 [IU]/h via INTRAVENOUS
  Filled 2019-01-19: qty 250

## 2019-01-19 MED ORDER — VITAMIN D 25 MCG (1000 UNIT) PO TABS
1000.0000 [IU] | ORAL_TABLET | Freq: Every day | ORAL | Status: DC
  Administered 2019-01-20 – 2019-01-23 (×4): 1000 [IU] via ORAL
  Filled 2019-01-19 (×10): qty 1

## 2019-01-19 MED ORDER — SODIUM CHLORIDE 0.9% FLUSH
3.0000 mL | Freq: Two times a day (BID) | INTRAVENOUS | Status: DC
  Administered 2019-01-19 – 2019-01-23 (×10): 3 mL via INTRAVENOUS

## 2019-01-19 MED ORDER — OXYCODONE HCL 5 MG PO TABS
5.0000 mg | ORAL_TABLET | ORAL | Status: DC | PRN
  Administered 2019-01-19 – 2019-01-23 (×17): 5 mg via ORAL
  Filled 2019-01-19 (×17): qty 1

## 2019-01-19 MED ORDER — SODIUM CHLORIDE 0.9 % IV SOLN: 250.0000 mL | INTRAVENOUS | Status: DC | PRN

## 2019-01-19 MED ORDER — AMLODIPINE BESYLATE 5 MG PO TABS
5.0000 mg | ORAL_TABLET | Freq: Every evening | ORAL | Status: DC
  Administered 2019-01-19 – 2019-01-22 (×4): 5 mg via ORAL
  Filled 2019-01-19 (×4): qty 1

## 2019-01-19 MED ORDER — LISINOPRIL 10 MG PO TABS
20.0000 mg | ORAL_TABLET | Freq: Every evening | ORAL | Status: DC
  Administered 2019-01-19 – 2019-01-22 (×4): 20 mg via ORAL
  Filled 2019-01-19 (×4): qty 2

## 2019-01-19 MED ORDER — HEPARIN (PORCINE) 25000 UT/250ML-% IV SOLN
1100.0000 [IU]/h | INTRAVENOUS | Status: DC
  Administered 2019-01-19: 1100 [IU]/h via INTRAVENOUS

## 2019-01-19 MED ORDER — MECLIZINE HCL 12.5 MG PO TABS: 12.5000 mg | ORAL_TABLET | Freq: Three times a day (TID) | ORAL | Status: DC | PRN

## 2019-01-19 MED ORDER — SODIUM CHLORIDE 0.9% FLUSH: 3.0000 mL | INTRAVENOUS | Status: DC | PRN

## 2019-01-19 MED ORDER — DOCUSATE SODIUM 100 MG PO CAPS
100.0000 mg | ORAL_CAPSULE | Freq: Two times a day (BID) | ORAL | Status: DC
  Administered 2019-01-19 – 2019-01-23 (×10): 100 mg via ORAL
  Filled 2019-01-19 (×10): qty 1

## 2019-01-19 MED ORDER — METOPROLOL SUCCINATE ER 25 MG PO TB24
25.0000 mg | ORAL_TABLET | Freq: Every morning | ORAL | Status: DC
  Administered 2019-01-19 – 2019-01-23 (×5): 25 mg via ORAL
  Filled 2019-01-19 (×5): qty 1

## 2019-01-19 MED ORDER — FENTANYL CITRATE (PF) 100 MCG/2ML IJ SOLN
50.0000 ug | Freq: Once | INTRAMUSCULAR | Status: AC
  Administered 2019-01-19: 50 ug via INTRAVENOUS
  Filled 2019-01-19: qty 2

## 2019-01-19 NOTE — ED Notes (Signed)
PT at bedside.

## 2019-01-19 NOTE — Consult Note (Addendum)
Reason for Consult: Fracture right proximal tibia Referring Physician: Dr. Paris [name] Diana is an 85 y.o. [name].  HPI: 85 year old [name] with chronic DVT left leg chronic venous insufficiency history of pulmonary embolism currently on Xarelto fell down the steps tripping at the last 2 steps landing on her right knee.  She could not stand up to walk and came to the ER with complaints of severe pain and swelling right knee, inability to weight-bear on the right leg and pain when trying to bend her right knee.  Pain is rated at 10  Date of injury January 18, 2019  Past Medical History:  Diagnosis Date  . Chronic deep vein thrombosis (DVT) of popliteal vein of left lower extremity (HCC)   . Chronic venous insufficiency   . DVT of lower extremity (deep venous thrombosis) (HCC)   . Enlarged heart   . Hypertension   . Hyperthyroidism   . PE (pulmonary thromboembolism) (HCC)     Past Surgical History:  Procedure Laterality Date  . BREAST LUMPECTOMY    . TUBAL LIGATION      History reviewed. No pertinent family history.  Social History:  reports that she has quit smoking. She has never used smokeless tobacco. She reports that she does not drink alcohol or use drugs.  Allergies: No Known Allergies  Medications:  Current Facility-Administered Medications:  .  0.9 %  sodium chloride infusion, 250 mL, Intravenous, PRN, Tarry Kos A, MD .  amLODipine (NORVASC) tablet 5 mg, 5 mg, Oral, QPM, Tarry Kos A, MD .  cholecalciferol (VITAMIN D) tablet 1,000 Units, 1,000 Units, Oral, Daily, Tarry Kos A, MD .  docusate sodium (COLACE) capsule 100 mg, 100 mg, Oral, BID, Tarry Kos A, MD, 100 mg at 01/19/19 1021 .  heparin ADULT infusion 100 units/mL (25000 units/264mL sodium chloride 0.45%), 1,300 Units/hr, Intravenous, Continuous, Tat, David, MD .  latanoprost (XALATAN) 0.005 % ophthalmic solution 1 drop, 1 drop, Both Eyes, QHS, Tarry Kos A, MD, 1 drop at 01/19/19  0214 .  lisinopril (PRINIVIL,ZESTRIL) tablet 20 mg, 20 mg, Oral, QPM, Tarry Kos A, MD .  meclizine (ANTIVERT) tablet 12.5 mg, 12.5 mg, Oral, TID PRN, Tarry Kos A, MD .  metoprolol succinate (TOPROL-XL) 24 hr tablet 25 mg, 25 mg, Oral, q morning - 10a, Tarry Kos A, MD, 25 mg at 01/19/19 1021 .  oxyCODONE (Oxy IR/ROXICODONE) immediate release tablet 5 mg, 5 mg, Oral, Q4H PRN, Tarry Kos A, MD, 5 mg at 01/19/19 1020 .  sodium chloride flush (NS) 0.9 % injection 3 mL, 3 mL, Intravenous, Q12H, Tarry Kos A, MD, 3 mL at 01/19/19 1021 .  sodium chloride flush (NS) 0.9 % injection 3 mL, 3 mL, Intravenous, PRN, Haydee Monica, MD  Current Outpatient Medications:  .  amLODipine (NORVASC) 5 MG tablet, Take 5 mg by mouth every evening., Disp: , Rfl:  .  cephALEXin (KEFLEX) 250 MG capsule, Take 250 mg by mouth at bedtime., Disp: , Rfl:  .  cholecalciferol (VITAMIN D3) 25 MCG (1000 UT) tablet, Take 1,000 Units by mouth daily., Disp: , Rfl:  .  latanoprost (XALATAN) 0.005 % ophthalmic solution, Place 1 drop into both eyes at bedtime., Disp: , Rfl:  .  lisinopril (PRINIVIL,ZESTRIL) 20 MG tablet, Take 20 mg by mouth every evening. , Disp: , Rfl:  .  meclizine (ANTIVERT) 12.5 MG tablet, Take 12.5 mg by mouth 3 (three) times daily as needed for dizziness or nausea. , Disp: , Rfl:  .  metoprolol  succinate (TOPROL-XL) 25 MG 24 hr tablet, Take 0.5 tablets (12.5 mg total) by mouth daily. (Patient taking differently: Take 25 mg by mouth every morning. ), Disp: 30 tablet, Rfl: 0 .  rivaroxaban (XARELTO) 20 MG TABS tablet, Take 20 mg by mouth daily., Disp: , Rfl:    Results for orders placed or performed during the hospital encounter of 01/18/19 (from the past 48 hour(s))  CBC with Differential/Platelet     Status: Abnormal   Collection Time: 01/18/19 11:32 PM  Result Value Ref Range   WBC 12.3 (H) 4.0 - 10.5 K/uL   RBC 4.53 3.87 - 5.11 MIL/uL   Hemoglobin 12.6 12.0 - 15.0 g/dL   HCT 09.6 28.3 -  66.2 %   MCV 90.3 80.0 - 100.0 fL   MCH 27.8 26.0 - 34.0 pg   MCHC 30.8 30.0 - 36.0 g/dL   RDW 94.7 65.4 - 65.0 %   Platelets 249 150 - 400 K/uL   nRBC 0.0 0.0 - 0.2 %   Neutrophils Relative % 78 %   Neutro Abs 9.6 (H) 1.7 - 7.7 K/uL   Lymphocytes Relative 16 %   Lymphs Abs 1.9 0.7 - 4.0 K/uL   Monocytes Relative 6 %   Monocytes Absolute 0.7 0.1 - 1.0 K/uL   Eosinophils Relative 0 %   Eosinophils Absolute 0.0 0.0 - 0.5 K/uL   Basophils Relative 0 %   Basophils Absolute 0.0 0.0 - 0.1 K/uL   Immature Granulocytes 0 %   Abs Immature Granulocytes 0.04 0.00 - 0.07 K/uL    Comment: Performed at [facility], [address], [place], [place] [address]  Basic metabolic panel     Status: None   Collection Time: 01/18/19 11:32 PM  Result Value Ref Range   Sodium 142 135 - 145 mmol/L   Potassium 3.9 3.5 - 5.1 mmol/L   Chloride 108 98 - 111 mmol/L   CO2 25 22 - 32 mmol/L   Glucose, Bld 97 70 - 99 mg/dL   BUN 12 8 - 23 mg/dL   Creatinine, Ser 6.81 0.44 - 1.00 mg/dL   Calcium 9.4 8.9 - 27.5 mg/dL   GFR calc non Af Amer >60 >60 mL/min   GFR calc Af Amer >60 >60 mL/min   Anion gap 9 5 - 15    Comment: Performed at [facility], [address], [place], [place] [address]    Dg Knee Complete 4 Views Right  Result Date: 01/18/2019 CLINICAL DATA:  Fall, right knee pain EXAM: RIGHT KNEE - COMPLETE 4+ VIEW COMPARISON:  07/18/2018 FINDINGS: Fracture through the right proximal tibial metaphysis, nondisplaced. A vertical component may extend into the lateral tibial plateau. Fracture noted through the fibular neck and proximal fibular shaft. No joint effusion. Moderate tricompartment degenerative changes within the right knee. IMPRESSION: Proximal right tibial metaphyseal fracture with possible extension into the lateral plateau. Fibular neck and proximal fibular shaft fracture. Electronically Signed   By: Charlett Nose M.D.   On: 01/18/2019 21:14    Review of Systems  Constitutional: Negative for  chills, fever and weight loss.  Cardiovascular:       History of heart arrhythmia atrial fibrillation  Neurological: Negative for tingling.   Blood pressure (!) 122/94, pulse (!) 112, temperature 97.7 F (36.5 C), resp. rate 16, height 5' 3.5" (1.613 m), weight 93 kg, SpO2 100 %. Physical Exam I find the patient in the emergency room lying in bed comfortably.  She is awake alert and oriented x3  mood and affect are normal.  She is unable to walk or stand on the right leg.  She is in a right knee immobilizer  She has a nontender abdomen.  She has normal strength in her upper extremities with normal alignment no range of motion deficits no atrophy skin is intact pulse and temperature are normal without edema  Left lower extremity full range of motion strength is normal skin is intact joints are stable no tenderness or swelling is noted distal pulses are intact color and temperature are normal and there are no sensory abnormalities  On the right side she has swelling of the right knee the compartments are soft.  Range of motion was deferred a gentle abduction abduction stress were applied to the knee and it was stable skin was intact without blistering pulse and perfusion were normal temperature normal there is no edema and there were no sensory abnormalities patient had normal dorsiflexion and passive range of motion in the foot and no pain with passive stretch   Assessment/Plan: X-ray does indeed show a metaphyseal fracture of the proximal tibia with an associated fibular fracture.  This is a low energy mechanism with nondisplaced fractures which should be amenable to nonoperative treatment however, I have discussed with the patient displacement will require surgery which would be risky because of her atrial fibrillation chronic Xarelto therapy.  In the short-term we will monitor for compartment syndrome I changed her to a longer knee immobilizer Consult vascular surgery regarding long-term  Xarelto therapy Weightbearing status no weightbearing for 6 weeks  Diana [name] 01/19/2019, 12:09 PM

## 2019-01-19 NOTE — H&P (Signed)
History and Physical    Diana [name] ZOX:[id number] DOB: 12-19-1933 DOA: 01/18/2019  PCP: Kirstie Peri, MD  Patient coming from: Home  Chief Complaint: Fall  HPI: Diana [name] is a 85 y.o. female with medical history significant of  DVT and PE on Xarelto, hypertension comes in after going down some stairs when she tripped and fell injuring her right knee.  Patient was noted to have some swelling orthopedic surgery was called who advised patient to be observed overnight for possible compartment syndrome per emergency room nurse practitioner.  Patient denies any recent illnesses.  She denies any loss of consciousness or head trauma.  Knee is in a brace she is found to have multiple fractures.  Review of Systems: As per HPI otherwise 10 point review of systems negative.   Past Medical History:  Diagnosis Date  . Chronic deep vein thrombosis (DVT) of popliteal vein of left lower extremity (HCC)   . Chronic venous insufficiency   . DVT of lower extremity (deep venous thrombosis) (HCC)   . Enlarged heart   . Hypertension   . Hyperthyroidism   . PE (pulmonary thromboembolism) (HCC)     Past Surgical History:  Procedure Laterality Date  . BREAST LUMPECTOMY    . TUBAL LIGATION       reports that she has quit smoking. She has never used smokeless tobacco. She reports that she does not drink alcohol or use drugs.  No Known Allergies  History reviewed. No pertinent family history.  No premature coronary artery disease  Prior to Admission medications   Medication Sig Start Date End Date Taking? Authorizing Provider  amLODipine (NORVASC) 5 MG tablet Take 5 mg by mouth every evening. 07/14/17  Yes [provider]  cephALEXin (KEFLEX) 250 MG capsule Take 250 mg by mouth at bedtime. 01/03/19  Yes [provider]  cholecalciferol (VITAMIN D3) 25 MCG (1000 UT) tablet Take 1,000 Units by mouth daily.   Yes [provider]  latanoprost (XALATAN) 0.005 %  ophthalmic solution Place 1 drop into both eyes at bedtime. 07/14/17  Yes [provider]  lisinopril (PRINIVIL,ZESTRIL) 20 MG tablet Take 20 mg by mouth every evening.  06/11/16  Yes [provider]  meclizine (ANTIVERT) 12.5 MG tablet Take 12.5 mg by mouth 3 (three) times daily as needed for dizziness or nausea.  07/14/16  Yes [provider]  metoprolol succinate (TOPROL-XL) 25 MG 24 hr tablet Take 0.5 tablets (12.5 mg total) by mouth daily. Patient taking differently: Take 25 mg by mouth every morning.  08/05/18  Yes Bethann Berkshire, MD  rivaroxaban (XARELTO) 20 MG TABS tablet Take 20 mg by mouth daily.   Yes [provider]    Physical Exam: Vitals:   01/18/19 2002 01/18/19 2327 01/19/19 0030 01/19/19 0045  BP:  (!) 156/100 128/86   Pulse:  (!) 105 70 (!) 40  Resp:  Temp:      TempSrc:      SpO2:  98% 98% 95%  Weight: 93 kg     Height: 5' 3.5" (1.613 m)         Constitutional: NAD, calm, comfortable Vitals:   01/18/19 2002 01/18/19 2327 01/19/19 0030 01/19/19 0045  BP:  (!) 156/100 128/86   Pulse:  (!) 105 70 (!) 40  Resp:  Temp:      TempSrc:      SpO2:  98% 98% 95%  Weight: 93 kg     Height:  5' 3.5" (1.613 m)      Eyes: PERRL, lids and conjunctivae normal ENMT: Mucous membranes are moist. Posterior pharynx clear of any exudate or lesions.Normal dentition.  Neck: normal, supple, no masses, no thyromegaly Respiratory: clear to auscultation bilaterally, no wheezing, no crackles. Normal respiratory effort. No accessory muscle use.  Cardiovascular: Regular rate and rhythm, no murmurs / rubs / gallops. No extremity edema. 2+ pedal pulses. No carotid bruits.  Abdomen: no tenderness, no masses palpated. No hepatosplenomegaly. Bowel sounds positive.  Musculoskeletal: no clubbing / cyanosis. No joint deformity upper and lower extremities. Good ROM, no contractures. Normal muscle tone.  Except right knee in brace with mild to  moderate amount of swelling there is no evidence of compartment syndrome in the leg Skin: no rashes, lesions, ulcers. No induration Neurologic: CN 2-12 grossly intact. Sensation intact, DTR normal. Strength 5/5 in all 4.  Psychiatric: Normal judgment and insight. Alert and oriented x 3. Normal mood.    Labs on Admission: I have personally reviewed following labs and imaging studies  CBC: Recent Labs  Lab 01/18/19 2332  WBC 12.3*  NEUTROABS 9.6*  HGB 12.6  HCT 40.9  MCV 90.3  PLT 249   Basic Metabolic Panel: Recent Labs  Lab 01/18/19 2332  NA 142  K 3.9  CL 108  CO2 25  GLUCOSE 97  BUN 12  CREATININE 0.65  CALCIUM 9.4   GFR: Estimated Creatinine Clearance: 58.4 mL/min (by C-G formula based on SCr of 0.65 mg/dL). Liver Function Tests: No results for input(s): AST, ALT, ALKPHOS, BILITOT, PROT, ALBUMIN in the last 168 hours. No results for input(s): LIPASE, AMYLASE in the last 168 hours. No results for input(s): AMMONIA in the last 168 hours. Coagulation Profile: No results for input(s): INR, PROTIME in the last 168 hours. Cardiac Enzymes: No results for input(s): CKTOTAL, CKMB, CKMBINDEX, TROPONINI in the last 168 hours. BNP (last 3 results) No results for input(s): PROBNP in the last 8760 hours. HbA1C: No results for input(s): HGBA1C in the last 72 hours. CBG: No results for input(s): GLUCAP in the last 168 hours. Lipid Profile: No results for input(s): CHOL, HDL, LDLCALC, TRIG, CHOLHDL, LDLDIRECT in the last 72 hours. Thyroid Function Tests: No results for input(s): TSH, T4TOTAL, FREET4, T3FREE, THYROIDAB in the last 72 hours. Anemia Panel: No results for input(s): VITAMINB12, FOLATE, FERRITIN, TIBC, IRON, RETICCTPCT in the last 72 hours. Urine analysis:    Component Value Date/Time   COLORURINE YELLOW 08/10/2017 1437   APPEARANCEUR HAZY (A) 08/10/2017 1437   LABSPEC 1.014 08/10/2017 1437   PHURINE 7.0 08/10/2017 1437   GLUCOSEU NEGATIVE 08/10/2017 1437    HGBUR SMALL (A) 08/10/2017 1437   BILIRUBINUR NEGATIVE 08/10/2017 1437   KETONESUR NEGATIVE 08/10/2017 1437   PROTEINUR NEGATIVE 08/10/2017 1437   NITRITE NEGATIVE 08/10/2017 1437   LEUKOCYTESUR MODERATE (A) 08/10/2017 1437   Sepsis Labs: !!!!!!!!!!!!!!!!!!!!!!!!!!!!!!!!!!!!!!!!!!!! @LABRCNTIP (procalcitonin:4,lacticidven:4) )No results found for this or any previous visit (from the past 240 hour(s)).   Radiological Exams on Admission: Dg Knee Complete 4 Views Right  Result Date: 01/18/2019 CLINICAL DATA:  Fall, right knee pain EXAM: RIGHT KNEE - COMPLETE 4+ VIEW COMPARISON:  07/18/2018 FINDINGS: Fracture through the right proximal tibial metaphysis, nondisplaced. A vertical component may extend into the lateral tibial plateau. Fracture noted through the fibular neck and proximal fibular shaft. No joint effusion. Moderate tricompartment degenerative changes within the right knee. IMPRESSION: Proximal right tibial metaphyseal fracture with possible extension into the lateral plateau. Fibular neck and proximal fibular  shaft fracture. Electronically Signed   By: Charlett Nose M.D.   On: 01/18/2019 21:14   Old chart reviewed Case discussed with Ms. niece in the ED nurse practitioner  Assessment/Plan 85 year old female mechanical fall with a right proximal tibial fracture with possible extension into the lateral plateau along with a fibular neck and proximal fibular shaft fracture  Principal Problem:   Fracture-elevate leg.  Observe overnight for orthopedic surgery to see her in the morning.  She has no evidence of compartment syndrome at this time.  Hold Xarelto for now and monitor swelling in the leg.  Await orthopedic surgery recommendations Dr. Romeo Apple in the morning.  Active Problems:   DVT (deep venous thrombosis) (HCC)-holding Xarelto   Pulmonary embolism (HCC)-holding Xarelto   Essential hypertension-continue home meds     DVT prophylaxis: Holding Xarelto Code Status: Full Family  Communication: Children Disposition Plan: Pending Consults called: Orthopedic surgery Admission status: Observation   DAVID,RACHAL A MD [facility]  If 7PM-7AM, please contact night-coverage [email or web address] Password [email or web address]  01/19/2019, 1:22 AM

## 2019-01-19 NOTE — ED Notes (Signed)
Pharmacy notified of aPTT 32. Will change 1800 aPTT level to heprin level.

## 2019-01-19 NOTE — Plan of Care (Signed)
  Problem: Acute Rehab PT Goals(only PT should resolve) Goal: Pt Will Go Supine/Side To Sit Outcome: Progressing Flowsheets (Taken 01/19/2019 1240) Pt will go Supine/Side to Sit: with minimal assist Goal: Patient Will Transfer Sit To/From Stand Outcome: Progressing Flowsheets (Taken 01/19/2019 1240) Patient will transfer sit to/from stand: with minimal assist Goal: Pt Will Transfer Bed To Chair/Chair To Bed Outcome: Progressing Flowsheets (Taken 01/19/2019 1240) Pt will Transfer Bed to Chair/Chair to Bed: with mod assist Goal: Pt Will Ambulate Outcome: Progressing Flowsheets (Taken 01/19/2019 1240) Pt will Ambulate: 15 feet; with moderate assist; with rolling walker   12:41 PM, 01/19/19 Ocie Bob, MPT Physical Therapist with [facility] [phone] office [phone] mobile phone

## 2019-01-19 NOTE — Progress Notes (Signed)
ANTICOAGULATION CONSULT NOTE - Initial Consult  Pharmacy Consult for heparin dosing Indication: pulmonary embolus  No Known Allergies  Patient Measurements: Height: 5' 3.5" (161.3 cm) Weight: 205 lb (93 kg) IBW/kg (Calculated) : 53.55 Heparin Dosing Weight: HEPARIN DW (KG): 74.8  Vital Signs: Temp: 97.7 F (36.5 C) (02/28 1017) BP: 122/94 (02/28 1017) Pulse Rate: 112 (02/28 1017)  Labs: Recent Labs    01/18/19 2332  HGB 12.6  HCT 40.9  PLT 249  CREATININE 0.65    Estimated Creatinine Clearance: 58.4 mL/min (by C-G formula based on SCr of 0.65 mg/dL).   Medical History: Past Medical History:  Diagnosis Date  . Chronic deep vein thrombosis (DVT) of popliteal vein of left lower extremity (HCC)   . Chronic venous insufficiency   . DVT of lower extremity (deep venous thrombosis) (HCC)   . Enlarged heart   . Hypertension   . Hyperthyroidism   . PE (pulmonary thromboembolism) (HCC)       Assessment: Pharmacy consulted to dose heparin for this 85 yo female with  PE.  She has been taking rivaroxaban for atrial fibrillation, with her last dose on 01/18/19 at 1200.    Goal of Therapy:  Heparin level 0.3-0.7 units/ml Monitor platelets by anticoagulation protocol: Yes   Plan:  No heparin bolus (pt has been on rivaroxaban) Start heparin infusion at 1300 units/hr Check aPTT in 6-8  hours and daily while on heparin until levels correlate Continue to monitor H&H and platelets    Tama High 01/19/2019,11:05 AM

## 2019-01-19 NOTE — Progress Notes (Signed)
ANTICOAGULATION CONSULT NOTE - Follow-Up  Pharmacy Consult for heparin dosing Indication: pulmonary embolus  No Known Allergies  Patient Measurements: Height: 5' 3.5" (161.3 cm) Weight: 205 lb (93 kg) IBW/kg (Calculated) : 53.55 Heparin Dosing Weight: HEPARIN DW (KG): 74.8  Vital Signs: Temp: 98.4 F (36.9 C) (02/28 1725) Temp Source: Oral (02/28 1725) BP: 105/49 (02/28 1725) Pulse Rate: 76 (02/28 1725)  Labs: Recent Labs    01/18/19 2332 01/19/19 1150  HGB 12.6  --   HCT 40.9  --   PLT 249  --   APTT  --  32  HEPARINUNFRC  --  1.06*  CREATININE 0.65  --       Assessment: Pharmacy consulted to dose heparin for this 85 yo female with  PE.  She has been taking rivaroxaban for atrial fibrillation, with her last dose on 01/18/19 at 1200.   01/19/19 PM UPDATE: Heparin level at 1803= 1.22 IU/mL   Goal of Therapy:  Heparin level 0.3-0.7 units/ml Monitor platelets by anticoagulation protocol: Yes   Plan:  Hold heparin infusion until 2000 tonight, then Decrease heparin infusion to 1100 units/hr Re-check heparin level in 6 hours  (~0200) Continue to monitor H&H and platelets    Tama High 01/19/2019,6:46 PM

## 2019-01-19 NOTE — Care Management Obs Status (Signed)
MEDICARE OBSERVATION STATUS NOTIFICATION   Patient Details  Name: Diana [name] MRN: [id number] Date of Birth: 01-10-1934   Medicare Observation Status Notification Given:  Yes    Corey Harold 01/19/2019, 3:40 PM

## 2019-01-19 NOTE — Clinical Social Work Placement (Signed)
   CLINICAL SOCIAL WORK PLACEMENT  NOTE  Date:  01/19/2019  Patient Details  Name: Diana [name] MRN: [id number] Date of Birth: 11-09-1934  Clinical Social Work is seeking post-discharge placement for this patient at the Skilled  Nursing Facility level of care (*CSW will initial, date and re-position this form in  chart as items are completed):  Yes   Patient/family provided with [facility] Clinical Social Work Department's list of facilities offering this level of care within the geographic area requested by the patient (or if unable, by the patient's family).  Yes   Patient/family informed of their freedom to choose among providers that offer the needed level of care, that participate in Medicare, Medicaid or managed care program needed by the patient, have an available bed and are willing to accept the patient.  Yes   Patient/family informed of [facility]'s ownership interest in [facility] and [facility], as well as of the fact that they are under no obligation to receive care at these facilities.  PASRR submitted to EDS on 01/19/19     PASRR number received on 01/19/19     Existing PASRR number confirmed on       FL2 transmitted to all facilities in geographic area requested by pt/family on 01/19/19     FL2 transmitted to all facilities within larger geographic area on       Patient informed that his/her managed care company has contracts with or will negotiate with certain facilities, including the following:            Patient/family informed of bed offers received.  Patient chooses bed at       Physician recommends and patient chooses bed at      Patient to be transferred to   on  .  Patient to be transferred to facility by       Patient family notified on   of transfer.  Name of family member notified:        PHYSICIAN       Additional Comment:  Pt, with daughter Wetzel Bjornstad [phone] at bedside, agrees to referral to SNF rehab.  States  that [facility] is first choice, [facility] second.   _______________________________________________ Ida Rogue, LCSW 01/19/2019, 1:32 PM

## 2019-01-19 NOTE — ED Notes (Signed)
Dr. Romeo Apple at bedside.   Assisted Dr. Romeo Apple with knee immobilizer change to right knee.

## 2019-01-19 NOTE — Evaluation (Signed)
Physical Therapy Evaluation Patient Details Name: Diana [name] MRN: [id number] DOB: 1934/02/20 Today's Date: 01/19/2019   History of Present Illness  Diana [name] is a 85 y/o female s/p fall down steps with proximal tib/fib fracture right knee.  PHMx: DVT, PE, HTN    Clinical Impression  Patient requires assistance to move RLE when sitting up at bedside demonstrating slow labored movement, has difficulty completing sit to stands due to generalized weakness, unable to keep RLE off floor or advance LLE due to weakness and at severe risk for falls.  Patient will benefit from continued physical therapy in hospital and recommended venue below to increase strength, balance, endurance for safe ADLs and gait.    Follow Up Recommendations SNF    Equipment Recommendations  None recommended by PT    Recommendations for Other Services       Precautions / Restrictions Precautions Precautions: Fall Required Braces or Orthoses: Knee Immobilizer - Right Restrictions Weight Bearing Restrictions: Yes RLE Weight Bearing: Non weight bearing      Mobility  Bed Mobility Overal bed mobility: Needs Assistance Bed Mobility: Supine to Sit;Sit to Supine     Supine to sit: Mod assist Sit to supine: Min assist;Mod assist   General bed mobility comments: slow labored movement  Transfers Overall transfer level: Needs assistance Equipment used: Rolling walker (2 wheeled) Transfers: Sit to/from Stand Sit to Stand: Mod assist;Max assist            Ambulation/Gait Ambulation/Gait assistance: Max assist Gait Distance (Feet): 1 Feet Assistive device: Rolling walker (2 wheeled) Gait Pattern/deviations: Shuffle Gait velocity: slow   General Gait Details: limited to 1 shuffling step on left foot, unable to advance LLE due weakness  Stairs            Wheelchair Mobility    Modified Rankin (Stroke Patients Only)       Balance Overall balance assessment: Needs  assistance Sitting-balance support: No upper extremity supported;Feet supported Sitting balance-Leahy Scale: Fair     Standing balance support: Bilateral upper extremity supported;During functional activity Standing balance-Leahy Scale: Poor Standing balance comment: poor return for keeping right foot off floor due weakness                             Pertinent Vitals/Pain Pain Assessment: Faces Faces Pain Scale: Hurts little more Pain Location: right knee with movement Pain Descriptors / Indicators: Sore Pain Intervention(s): Limited activity within patient's tolerance;Monitored during session;Repositioned    Home Living Family/patient expects to be discharged to:: Private residence Living Arrangements: Spouse/significant other Available Help at Discharge: Family;Available PRN/intermittently Type of Home: House Home Access: Stairs to enter;Ramped entrance   Entrance Stairs-Number of Steps: 8 Home Layout: Other (Comment)(split level) Home Equipment: Walker - 4 wheels;Cane - single point;Shower seat;Wheelchair - manual;Hospital bed Additional Comments: has 7-8 steps to front door, can use ramped entrance in back    Prior Function Level of Independence: Independent         Comments: Tourist information centre manager, drives, 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      Hand Dominance        Extremity/Trunk Assessment   Upper Extremity Assessment Upper Extremity Assessment: Generalized weakness    Lower Extremity Assessment Lower Extremity Assessment: Generalized weakness    Cervical / Trunk Assessment Cervical / Trunk Assessment: Normal  Communication   Communication: No difficulties  Cognition Arousal/Alertness: Awake/alert Behavior During Therapy: WFL for tasks assessed/performed Overall Cognitive Status: Within Functional Limits for  tasks assessed                                        General Comments      Exercises     Assessment/Plan    PT  Assessment Patient needs continued PT services  PT Problem List Decreased strength;Decreased activity tolerance;Decreased balance;Decreased mobility       PT Treatment Interventions Gait training;Functional mobility training;Therapeutic activities;Therapeutic exercise;Patient/family education;Wheelchair mobility training    PT Goals (Current goals can be found in the Care Plan section)  Acute Rehab PT Goals Patient Stated Goal: return home PT Goal Formulation: With patient/family Time For Goal Achievement: 02/03/19 Potential to Achieve Goals: Good    Frequency Min 3X/week   Barriers to discharge        Co-evaluation               AM-PAC PT "6 Clicks" Mobility  Outcome Measure Help needed turning from your back to your side while in a flat bed without using bedrails?: A Lot Help needed moving from lying on your back to sitting on the side of a flat bed without using bedrails?: A Lot Help needed moving to and from a bed to a chair (including a wheelchair)?: A Lot Help needed standing up from a chair using your arms (e.g., wheelchair or bedside chair)?: A Lot Help needed to walk in hospital room?: Total Help needed climbing 3-5 steps with a railing? : Total 6 Click Score: 10    End of Session Equipment Utilized During Treatment: Gait belt Activity Tolerance: Patient tolerated treatment well;Patient limited by fatigue   Nurse Communication: Mobility status PT Visit Diagnosis: Unsteadiness on feet (R26.81);Other abnormalities of gait and mobility (R26.89);Muscle weakness (generalized) (M62.81);History of falling (Z91.81)    Time: 1115-1150 PT Time Calculation (min) (ACUTE ONLY): 35 min   Charges:   PT Evaluation $PT Eval Moderate Complexity: 1 Mod PT Treatments $Therapeutic Activity: 23-37 mins        12:39 PM, 01/19/19 Ocie Bob, MPT Physical Therapist with [facility] [phone] office [phone] mobile phone

## 2019-01-19 NOTE — NC FL2 (Signed)
[place] MEDICAID FL2 LEVEL OF CARE SCREENING TOOL     IDENTIFICATION  Patient Name: Diana [name] Birthdate: May 04, 1934 Sex: female Admission Date (Current Location): 01/18/2019  [facility] and [place] Number:  [employer] and Address:  [facility],  [address], [place] [address]      Provider Number: [id number]  Attending Physician Name and Address:  Catarina Hartshorn, MD  Relative Name and Phone Number:       Current Level of Care: Hospital Recommended Level of Care: Skilled Nursing Facility Prior Approval Number:    Date Approved/Denied:   PASRR Number: [id number]  Discharge Plan: SNF    Current Diagnoses: Patient Active Problem List   Diagnosis Date Noted  . Fracture 01/19/2019  . Tibia/fibula fracture, right, closed, initial encounter   . Nausea and vomiting in adult 08/11/2017  . Essential hypertension 08/11/2017  . Pre-syncope 08/10/2017  . Elevated troponin 08/10/2017  . DVT (deep venous thrombosis) (HCC) 08/10/2016  . Pulmonary embolism (HCC) 08/10/2016  . HTN (hypertension) 08/10/2016    Orientation RESPIRATION BLADDER Height & Weight     Self, Time, Situation, Place  Normal Continent Weight: 93 kg Height:  5' 3.5" (161.3 cm)  BEHAVIORAL SYMPTOMS/MOOD NEUROLOGICAL BOWEL NUTRITION STATUS  (none) (none) Continent (Heart healthy)  AMBULATORY STATUS COMMUNICATION OF NEEDS Skin   Extensive Assist Verbally Normal                       Personal Care Assistance Level of Assistance  Bathing, Feeding, Dressing Bathing Assistance: Maximum assistance Feeding assistance: Independent Dressing Assistance: Limited assistance     Functional Limitations Info  Sight, Hearing, Speech Sight Info: Adequate Hearing Info: Adequate Speech Info: Adequate    SPECIAL CARE FACTORS FREQUENCY  PT (By licensed PT)     PT Frequency: 5X/W              Contractures Contractures Info: Not present    Additional Factors Info  Code  Status, Allergies Code Status Info: Full Allergies Info: NKA           Current Medications (01/19/2019):  This is the current hospital active medication list Current Facility-Administered Medications  Medication Dose Route Frequency Provider Last Rate Last Dose  . 0.9 %  sodium chloride infusion  250 mL Intravenous PRN Tarry Kos A, MD      . amLODipine (NORVASC) tablet 5 mg  5 mg Oral QPM Haydee Monica, MD      . cholecalciferol (VITAMIN D) tablet 1,000 Units  1,000 Units Oral Daily Tarry Kos A, MD      . docusate sodium (COLACE) capsule 100 mg  100 mg Oral BID Tarry Kos A, MD   100 mg at 01/19/19 1021  . heparin ADULT infusion 100 units/mL (25000 units/233mL sodium chloride 0.45%)  1,300 Units/hr Intravenous Continuous Tat, Onalee Hua, MD 13 mL/hr at 01/19/19 1212 1,300 Units/hr at 01/19/19 1212  . latanoprost (XALATAN) 0.005 % ophthalmic solution 1 drop  1 drop Both Eyes QHS Haydee Monica, MD   1 drop at 01/19/19 0214  . lisinopril (PRINIVIL,ZESTRIL) tablet 20 mg  20 mg Oral QPM Tarry Kos A, MD      . meclizine (ANTIVERT) tablet 12.5 mg  12.5 mg Oral TID PRN Haydee Monica, MD      . metoprolol succinate (TOPROL-XL) 24 hr tablet 25 mg  25 mg Oral q morning - 10a Haydee Monica, MD   25 mg at 01/19/19 1021  .  oxyCODONE (Oxy IR/ROXICODONE) immediate release tablet 5 mg  5 mg Oral Q4H PRN Haydee Monica, MD   5 mg at 01/19/19 1020  . sodium chloride flush (NS) 0.9 % injection 3 mL  3 mL Intravenous Q12H Tarry Kos A, MD   3 mL at 01/19/19 1021  . sodium chloride flush (NS) 0.9 % injection 3 mL  3 mL Intravenous PRN Haydee Monica, MD       Current Outpatient Medications  Medication Sig Dispense Refill  . amLODipine (NORVASC) 5 MG tablet Take 5 mg by mouth every evening.    . cephALEXin (KEFLEX) 250 MG capsule Take 250 mg by mouth at bedtime.    . cholecalciferol (VITAMIN D3) 25 MCG (1000 UT) tablet Take 1,000 Units by mouth daily.    Marland Kitchen latanoprost (XALATAN) 0.005 %  ophthalmic solution Place 1 drop into both eyes at bedtime.    Marland Kitchen lisinopril (PRINIVIL,ZESTRIL) 20 MG tablet Take 20 mg by mouth every evening.     . meclizine (ANTIVERT) 12.5 MG tablet Take 12.5 mg by mouth 3 (three) times daily as needed for dizziness or nausea.     . metoprolol succinate (TOPROL-XL) 25 MG 24 hr tablet Take 0.5 tablets (12.5 mg total) by mouth daily. (Patient taking differently: Take 25 mg by mouth every morning. ) 30 tablet 0  . rivaroxaban (XARELTO) 20 MG TABS tablet Take 20 mg by mouth daily.       Discharge Medications: Please see discharge summary for a list of discharge medications.  Relevant Imaging Results:  Relevant Lab Results:   Additional Information [id number] [place], [place]

## 2019-01-19 NOTE — Progress Notes (Signed)
PROGRESS NOTE  Diana [name] SLH:[id number] DOB: [birth year] DOA: 01/18/2019 PCP: Kirstie Peri, MD  Brief History:  85 year old female with a history of DVT/PE on rivaroxaban, hypertension, hyperthyroidism, presenting with a mechanical fall when she caught her foot stuck between some stairs and fell down 2 steps.  The patient landed on her right knee.  Because of significant pain, she came to emergency department for further evaluation.  X-ray of the right knee revealed a proximal right tibial metaphyseal fracture with possible extension into the lateral tibial plateau.  The patient denied any prodromal symptoms or loss of consciousness.  Orthopedic surgery, Dr. Romeo Apple was consulted.  From an orthopedic standpoint, he felt that the patient could be treated with nonoperative management.  The patient denies any fevers, chills, chest pain, shortness breath, palpitations, nausea, vomiting, diarrhea, dental pain. Notably, the patient states that she had a DVT in her left leg approximately 30 years ago when she was in [place].  She states that she was not anticoagulated, but details were sketchy.  Nevertheless, the patient was diagnosed with a DVT in her left popliteal vein with small segmental PE in the right upper lobe and right lower lobe on 08/10/2016.  The patient was initially treated with rivaroxaban.  She took it for 6 months.  However, in September 2019, the patient was diagnosed with atrial fibrillation and restarted on rivaroxaban.  Assessment/Plan: Right proximal tibial fracture -Nonoperative management per orthopedics -Judicious pain control -NWB per Ortho -PT evaluation -No signs of compartment syndrome presently  DVT/PE -IV heparin and monitor clinically -if remains stable x 24 hours, likely transition back to Xarelto  Atrial fibrillation, type unspecified -Continue rivaroxaban -Continue metoprolol succinate  Essential hypertension -Continue amlodipine,  lisinopril, metoprolol succinate     Disposition Plan:   Home in 1-2 days  Family Communication:   Spouse updated at bedside 2/28  Consultants:  Ortho  Code Status:  FULL   DVT Prophylaxis:  IV Heparin    Procedures: As Listed in Progress Note Above  Antibiotics: None   Patient denies fevers, chills, headache, chest pain, dyspnea, nausea, vomiting, diarrhea, abdominal pain, dysuria, hematuria, hematochezia, and melena. 0800 to 0840    Subjective: Pt complains severe pain in right leg, worse with movement.  Patient denies fevers, chills, headache, chest pain, dyspnea, nausea, vomiting, diarrhea, abdominal pain, dysuria, hematuria, hematochezia, and melena.   Objective: Vitals:   01/19/19 0030 01/19/19 0045 01/19/19 0100 01/19/19 0130  BP: 128/86  120/84 (!) 142/90  Pulse: 70 (!) 40 (!) 44 (!) 109  Resp: 16 17 17 15   Temp:      TempSrc:      SpO2: 98% 95% 97% 98%  Weight:      Height:       No intake or output data in the 24 hours ending 01/19/19 0913 Weight change:  Exam:   General:  Pt is alert, follows commands appropriately, not in acute distress  HEENT: No icterus, No thrush, No neck mass, [place]/AT  Cardiovascular: RRR, S1/S2, no rubs, no gallops  Respiratory: CTA bilaterally, no wheezing, no crackles, no rhonchi  Abdomen: Soft/+BS, non tender, non distended, no guarding  Extremities: No edema, No lymphangitis, No petechiae, No rashes, no synovitis;  RLE edema from thigh to mid tibia. Compartments soft.  PT and DP pulses present on R.  Extremity warm   Data Reviewed: I have personally reviewed following labs and imaging studies Basic Metabolic Panel: Recent Labs  Lab 01/18/19 2332  NA 142  K 3.9  CL 108  CO2 25  GLUCOSE 97  BUN 12  CREATININE 0.65  CALCIUM 9.4   Liver Function Tests: No results for input(s): AST, ALT, ALKPHOS, BILITOT, PROT, ALBUMIN in the last 168 hours. No results for input(s): LIPASE, AMYLASE in the last 168 hours. No  results for input(s): AMMONIA in the last 168 hours. Coagulation Profile: No results for input(s): INR, PROTIME in the last 168 hours. CBC: Recent Labs  Lab 01/18/19 2332  WBC 12.3*  NEUTROABS 9.6*  HGB 12.6  HCT 40.9  MCV 90.3  PLT 249   Cardiac Enzymes: No results for input(s): CKTOTAL, CKMB, CKMBINDEX, TROPONINI in the last 168 hours. BNP: Invalid input(s): POCBNP CBG: No results for input(s): GLUCAP in the last 168 hours. HbA1C: No results for input(s): HGBA1C in the last 72 hours. Urine analysis:    Component Value Date/Time   COLORURINE YELLOW 08/10/2017 1437   APPEARANCEUR HAZY (A) 08/10/2017 1437   LABSPEC 1.014 08/10/2017 1437   PHURINE 7.0 08/10/2017 1437   GLUCOSEU NEGATIVE 08/10/2017 1437   HGBUR SMALL (A) 08/10/2017 1437   BILIRUBINUR NEGATIVE 08/10/2017 1437   KETONESUR NEGATIVE 08/10/2017 1437   PROTEINUR NEGATIVE 08/10/2017 1437   NITRITE NEGATIVE 08/10/2017 1437   LEUKOCYTESUR MODERATE (A) 08/10/2017 1437   Sepsis Labs: @LABRCNTIP (procalcitonin:4,lacticidven:4) )No results found for this or any previous visit (from the past 240 hour(s)).   Scheduled Meds: . amLODipine  5 mg Oral QPM  . cholecalciferol  1,000 Units Oral Daily  . docusate sodium  100 mg Oral BID  . latanoprost  1 drop Both Eyes QHS  . lisinopril  20 mg Oral QPM  . metoprolol succinate  25 mg Oral q morning - 10a  . sodium chloride flush  3 mL Intravenous Q12H   Continuous Infusions: . sodium chloride      Procedures/Studies: Dg Knee Complete 4 Views Right  Result Date: 01/18/2019 CLINICAL DATA:  Fall, right knee pain EXAM: RIGHT KNEE - COMPLETE 4+ VIEW COMPARISON:  07/18/2018 FINDINGS: Fracture through the right proximal tibial metaphysis, nondisplaced. A vertical component may extend into the lateral tibial plateau. Fracture noted through the fibular neck and proximal fibular shaft. No joint effusion. Moderate tricompartment degenerative changes within the right knee.  IMPRESSION: Proximal right tibial metaphyseal fracture with possible extension into the lateral plateau. Fibular neck and proximal fibular shaft fracture. Electronically Signed   By: Charlett Nose M.D.   On: 01/18/2019 21:14    Catarina Hartshorn, DO  [facility] Pager [phone]  If 7PM-7AM, please contact night-coverage [email or web address] Password [facility] 01/19/2019, 9:13 AM   LOS: 0 days

## 2019-01-20 DIAGNOSIS — I4891 Unspecified atrial fibrillation: Secondary | ICD-10-CM | POA: Diagnosis not present

## 2019-01-20 DIAGNOSIS — I2699 Other pulmonary embolism without acute cor pulmonale: Secondary | ICD-10-CM | POA: Diagnosis not present

## 2019-01-20 DIAGNOSIS — S82121D Displaced fracture of lateral condyle of right tibia, subsequent encounter for closed fracture with routine healing: Secondary | ICD-10-CM

## 2019-01-20 DIAGNOSIS — I10 Essential (primary) hypertension: Secondary | ICD-10-CM | POA: Diagnosis not present

## 2019-01-20 LAB — CBC
HCT: 33.7 % — ABNORMAL LOW (ref 36.0–46.0)
Hemoglobin: 10.2 g/dL — ABNORMAL LOW (ref 12.0–15.0)
MCH: 27.4 pg (ref 26.0–34.0)
MCHC: 30.3 g/dL (ref 30.0–36.0)
MCV: 90.6 fL (ref 80.0–100.0)
Platelets: 224 10*3/uL (ref 150–400)
RBC: 3.72 MIL/uL — AB (ref 3.87–5.11)
RDW: 15.4 % (ref 11.5–15.5)
WBC: 11.7 10*3/uL — ABNORMAL HIGH (ref 4.0–10.5)
nRBC: 0 % (ref 0.0–0.2)

## 2019-01-20 LAB — HEPARIN LEVEL (UNFRACTIONATED)
Heparin Unfractionated: 1.11 IU/mL — ABNORMAL HIGH (ref 0.30–0.70)
Heparin Unfractionated: 0.71 IU/mL — ABNORMAL HIGH (ref 0.30–0.70)

## 2019-01-20 LAB — BASIC METABOLIC PANEL
Anion gap: 9 (ref 5–15)
BUN: 18 mg/dL (ref 8–23)
CHLORIDE: 106 mmol/L (ref 98–111)
CO2: 24 mmol/L (ref 22–32)
Calcium: 8.4 mg/dL — ABNORMAL LOW (ref 8.9–10.3)
Creatinine, Ser: 0.86 mg/dL (ref 0.44–1.00)
GFR calc Af Amer: >60 mL/min (ref >60)
GFR calc non Af Amer: >60 mL/min (ref >60)
Glucose, Bld: 124 mg/dL — ABNORMAL HIGH (ref 70–99)
Potassium: 3.7 mmol/L (ref 3.5–5.1)
Sodium: 139 mmol/L (ref 135–145)

## 2019-01-20 LAB — APTT: aPTT: 125 seconds — ABNORMAL HIGH (ref 24–36)

## 2019-01-20 MED ORDER — HEPARIN (PORCINE) 25000 UT/250ML-% IV SOLN
850.0000 [IU]/h | INTRAVENOUS | Status: DC
  Administered 2019-01-20: 900 [IU]/h via INTRAVENOUS
  Filled 2019-01-20: qty 250

## 2019-01-20 MED ORDER — RIVAROXABAN 20 MG PO TABS
20.0000 mg | ORAL_TABLET | Freq: Every day | ORAL | Status: DC
  Administered 2019-01-20 – 2019-01-22 (×3): 20 mg via ORAL
  Filled 2019-01-20 (×3): qty 1

## 2019-01-20 NOTE — Progress Notes (Signed)
PROGRESS NOTE  Diana [name] KQA:[id number] DOB: 28-Aug-1934 DOA: 01/18/2019 PCP: Kirstie Peri, MD   Brief History:  85 year old female with a history of DVT/PE on rivaroxaban, hypertension, hyperthyroidism, presenting with a mechanical fall when she caught her foot stuck between some stairs and fell down 2 steps.  The patient landed on her right knee.  Because of significant pain, she came to emergency department for further evaluation.  X-ray of the right knee revealed a proximal right tibial metaphyseal fracture with possible extension into the lateral tibial plateau.  The patient denied any prodromal symptoms or loss of consciousness.  Orthopedic surgery, Dr. Romeo Apple was consulted.  From an orthopedic standpoint, he felt that the patient could be treated with nonoperative management.  The patient denies any fevers, chills, chest pain, shortness breath, palpitations, nausea, vomiting, diarrhea, dental pain. Notably, the patient states that she had a DVT in her left leg approximately 30 years ago when she was in [place].  She states that she was not anticoagulated, but details were sketchy.  Nevertheless, the patient was diagnosed with a DVT in her left popliteal vein with small segmental PE in the right upper lobe and right lower lobe on 08/10/2016.  The patient was initially treated with rivaroxaban.  She took it for 6 months.  However, in September 2019, the patient was diagnosed with atrial fibrillation and restarted on rivaroxaban.  Assessment/Plan: Right proximal tibial fracture -Nonoperative management per orthopedics -Judicious pain control -NWB per Ortho -PT evaluation>>SNF -No signs of compartment syndrome presently  DVT/PE -IV heparin and monitor clinically -transition to Xarelto 2/29  Acute Blood Loss anemia -due to bleeding from fracture -monitor Hgb -am CBC -remains hemodynamically stable  Atrial fibrillation, type unspecified -Continue  rivaroxaban -Continue metoprolol succinate  Essential hypertension -Continue amlodipine, lisinopril, metoprolol succinate     Disposition Plan:  SNF on 3/2 Family Communication:   Spouse updated at bedside 2/28  Consultants:  Ortho  Code Status:  FULL   DVT Prophylaxis:  IV Heparin>>>Xarelto    Procedures: As Listed in Progress Note Above  Antibiotics: None    Subjective: Pt states that right leg pain is severe with any movement.  Minimal pain at rest.  Denies f/c, cp, sob, n/v/d, abd pain.  Objective: Vitals:   01/20/19 0800 01/20/19 1015 01/20/19 1200 01/20/19 1325  BP: (!) 80/51 123/69 129/63 (!) 140/48  Pulse: (!) 116 82 91 64  Resp: 18 18 18 18   Temp: 98.3 F (36.8 C) 98.4 F (36.9 C) 98.3 F (36.8 C) 98.1 F (36.7 C)  TempSrc: Oral Oral Oral Oral  SpO2: 100% 98% 99% 98%  Weight:      Height:        Intake/Output Summary (Last 24 hours) at 01/20/2019 1441 Last data filed at 01/20/2019 1300 Gross per 24 hour  Intake 575.1 ml  Output 1000 ml  Net -424.9 ml   Weight change:  Exam:   General:  Pt is alert, follows commands appropriately, not in acute distress  HEENT: No icterus, No thrush, No neck mass, [place]/AT  Cardiovascular: RRR, S1/S2, no rubs, no gallops  Respiratory: CTA bilaterally, no wheezing, no crackles, no rhonchi  Abdomen: Soft/+BS, non tender, non distended, no guarding  Extremities: No edema, No lymphangitis, No petechiae, No rashes, no synovitis   Data Reviewed: I have personally reviewed following labs and imaging studies Basic Metabolic Panel: Recent Labs  Lab 01/18/19 2332 01/20/19 1350  NA 142 139  K 3.9 3.7  CL 108 106  CO2 25 24  GLUCOSE 97 124*  BUN 12 18  CREATININE 0.65 0.86  CALCIUM 9.4 8.4*   Liver Function Tests: No results for input(s): AST, ALT, ALKPHOS, BILITOT, PROT, ALBUMIN in the last 168 hours. No results for input(s): LIPASE, AMYLASE in the last 168 hours. No results for input(s):  AMMONIA in the last 168 hours. Coagulation Profile: No results for input(s): INR, PROTIME in the last 168 hours. CBC: Recent Labs  Lab 01/18/19 2332 01/20/19 1350  WBC 12.3* 11.7*  NEUTROABS 9.6*  --   HGB 12.6 10.2*  HCT 40.9 33.7*  MCV 90.3 90.6  PLT 249 224   Cardiac Enzymes: No results for input(s): CKTOTAL, CKMB, CKMBINDEX, TROPONINI in the last 168 hours. BNP: Invalid input(s): POCBNP CBG: No results for input(s): GLUCAP in the last 168 hours. HbA1C: No results for input(s): HGBA1C in the last 72 hours. Urine analysis:    Component Value Date/Time   COLORURINE YELLOW 08/10/2017 1437   APPEARANCEUR HAZY (A) 08/10/2017 1437   LABSPEC 1.014 08/10/2017 1437   PHURINE 7.0 08/10/2017 1437   GLUCOSEU NEGATIVE 08/10/2017 1437   HGBUR SMALL (A) 08/10/2017 1437   BILIRUBINUR NEGATIVE 08/10/2017 1437   KETONESUR NEGATIVE 08/10/2017 1437   PROTEINUR NEGATIVE 08/10/2017 1437   NITRITE NEGATIVE 08/10/2017 1437   LEUKOCYTESUR MODERATE (A) 08/10/2017 1437   Sepsis Labs: @LABRCNTIP (procalcitonin:4,lacticidven:4) )No results found for this or any previous visit (from the past 240 hour(s)).   Scheduled Meds: . amLODipine  5 mg Oral QPM  . cholecalciferol  1,000 Units Oral Daily  . docusate sodium  100 mg Oral BID  . latanoprost  1 drop Both Eyes QHS  . lisinopril  20 mg Oral QPM  . metoprolol succinate  25 mg Oral q morning - 10a  . rivaroxaban  20 mg Oral Q supper  . sodium chloride flush  3 mL Intravenous Q12H   Continuous Infusions: . sodium chloride      Procedures/Studies: Dg Knee Complete 4 Views Right  Result Date: 01/18/2019 CLINICAL DATA:  Fall, right knee pain EXAM: RIGHT KNEE - COMPLETE 4+ VIEW COMPARISON:  07/18/2018 FINDINGS: Fracture through the right proximal tibial metaphysis, nondisplaced. A vertical component may extend into the lateral tibial plateau. Fracture noted through the fibular neck and proximal fibular shaft. No joint effusion. Moderate  tricompartment degenerative changes within the right knee. IMPRESSION: Proximal right tibial metaphyseal fracture with possible extension into the lateral plateau. Fibular neck and proximal fibular shaft fracture. Electronically Signed   By: Charlett Nose M.D.   On: 01/18/2019 21:14    Catarina Hartshorn, DO  [facility] Pager [phone]  If 7PM-7AM, please contact night-coverage [email or web address] Password [email or web address] 01/20/2019, 2:41 PM   LOS: 0 days

## 2019-01-20 NOTE — Progress Notes (Signed)
ANTICOAGULATION CONSULT NOTE - Follow-Up  Pharmacy Consult for Heparin (Xarelto on hold) Indication: History of pulmonary embolus   No Known Allergies  Patient Measurements: Height: 5' 3.5" (161.3 cm) Weight: 205 lb (93 kg) IBW/kg (Calculated) : 53.55 Heparin Dosing Weight: HEPARIN DW (KG): 74.8  Vital Signs: Temp: 98.5 F (36.9 C) (02/28 2343) Temp Source: Oral (02/28 2343) BP: 149/80 (02/28 2343) Pulse Rate: 59 (02/28 2343)  Labs: Recent Labs    01/18/19 2332 01/19/19 1150 01/19/19 1804 01/20/19 0233  HGB 12.6  --   --   --   HCT 40.9  --   --   --   PLT 249  --   --   --   APTT  --  32  --  125*  HEPARINUNFRC  --  1.06* 1.22* 1.11*  CREATININE 0.65  --   --   --       Assessment: Pharmacy consulted to dose heparin for this 85 yo female with  PE while Xarelto is on hold.  She has been taking Xarelto, with her last dose on 01/18/19 at 1200.   2/29 AM update: both heparin level and aPTT are elevated and appear to correlate with each other, will use heparin level only to guide dosing, no issues overnight per RN.    Goal of Therapy:  Heparin level 0.3-0.7 units/ml Monitor platelets by anticoagulation protocol: Yes   Plan:  Hold heparin x 1 hr Re-start heparin drip at 900 units/hr at 0515 Re-check heparin level in 8 hours  Abran Duke, PharmD, BCPS Clinical Pharmacist Phone: [phone]

## 2019-01-20 NOTE — Progress Notes (Signed)
ANTICOAGULATION CONSULT NOTE - Follow-Up  Pharmacy Consult for Heparin (Xarelto on hold) Indication: History of pulmonary embolus   No Known Allergies  Patient Measurements: Height: 5' 3.5" (161.3 cm) Weight: 205 lb (93 kg) IBW/kg (Calculated) : 53.55 Heparin Dosing Weight: HEPARIN DW (KG): 74.8  Vital Signs: Temp: 98.1 F (36.7 C) (02/29 1325) Temp Source: Oral (02/29 1325) BP: 140/48 (02/29 1325) Pulse Rate: 64 (02/29 1325)  Labs: Recent Labs    01/18/19 2332  01/19/19 1150 01/19/19 1804 01/20/19 0233 01/20/19 1350  HGB 12.6  --   --   --   --  10.2*  HCT 40.9  --   --   --   --  33.7*  PLT 249  --   --   --   --  224  APTT  --   --  32  --  125*  --   HEPARINUNFRC  --    < > 1.06* 1.22* 1.11* 0.71*  CREATININE 0.65  --   --   --   --  0.86   < > = values in this interval not displayed.      Assessment: Pharmacy consulted to dose heparin for this 85 yo female with  PE while Xarelto is on hold.  She has been taking Xarelto, with her last dose on 01/18/19 at 1200.   2/29 1330 update:  Heparin level = 0.71 IU/mL, very slightly above goal rand    Goal of Therapy:  Heparin level 0.3-0.7 units/ml Monitor platelets by anticoagulation protocol: Yes   Plan:    Decrease heparin drip to 850 units/hr   Daily heparin level   Tama High, Pharm. D. Clinical Pharmacist 01/20/2019 2:34 PM

## 2019-01-21 DIAGNOSIS — S82121D Displaced fracture of lateral condyle of right tibia, subsequent encounter for closed fracture with routine healing: Secondary | ICD-10-CM | POA: Diagnosis not present

## 2019-01-21 DIAGNOSIS — I10 Essential (primary) hypertension: Secondary | ICD-10-CM | POA: Diagnosis not present

## 2019-01-21 DIAGNOSIS — I4891 Unspecified atrial fibrillation: Secondary | ICD-10-CM | POA: Diagnosis not present

## 2019-01-21 LAB — CBC
HCT: 33 % — ABNORMAL LOW (ref 36.0–46.0)
HEMOGLOBIN: 10 g/dL — AB (ref 12.0–15.0)
MCH: 27.4 pg (ref 26.0–34.0)
MCHC: 30.3 g/dL (ref 30.0–36.0)
MCV: 90.4 fL (ref 80.0–100.0)
Platelets: 212 10*3/uL (ref 150–400)
RBC: 3.65 MIL/uL — ABNORMAL LOW (ref 3.87–5.11)
RDW: 15.4 % (ref 11.5–15.5)
WBC: 12.3 10*3/uL — ABNORMAL HIGH (ref 4.0–10.5)
nRBC: 0 % (ref 0.0–0.2)

## 2019-01-21 MED ORDER — MORPHINE SULFATE (PF) 2 MG/ML IV SOLN
2.0000 mg | Freq: Once | INTRAVENOUS | Status: AC
  Administered 2019-01-21: 2 mg via INTRAVENOUS
  Filled 2019-01-21: qty 1

## 2019-01-21 NOTE — Progress Notes (Signed)
LCSW called [employer] and spoke to [employer]. She reported the admissions person Elease Hashimoto works during the week and will return on Monday. They have not received [employer] Authorization as of yet.    [employer] LCSW [phone]

## 2019-01-21 NOTE — Progress Notes (Signed)
PROGRESS NOTE  Diana [name] WCB:[id number] DOB: 1934/03/08 DOA: 01/18/2019 PCP: Kirstie Peri, MD  Brief History: 85 year old female with a history of DVT/PE on rivaroxaban, hypertension, hyperthyroidism,presenting with a mechanical fall when she caught her foot stuck between some stairs and fell down 2 steps. The patient landed on her right knee. Because of significant pain, she came to emergency department for further evaluation. X-ray of the right knee revealed a proximal right tibial metaphyseal fracture with possible extension into the lateral tibial plateau. The patient denied any prodromal symptoms or loss of consciousness. Orthopedic surgery, Dr. Romeo Apple was consulted. From an orthopedic standpoint, he felt that the patient could be treated withnonoperative management.The patient denies any fevers, chills, chest pain, shortness breath, palpitations, nausea, vomiting, diarrhea, dental pain. Notably, the patient states that she had a DVT in her left leg approximately 30 years ago when she was in [place]. She states that she was not anticoagulated, but details were sketchy. Nevertheless, the patient was diagnosed with a DVT in her left popliteal vein with small segmental PE in the right upper lobe and right lower lobe on 08/10/2016. The patient was initially treated with rivaroxaban. She took it for 6 months. However, in September 2019, the patient was diagnosed with atrial fibrillation and restarted on rivaroxaban.  Assessment/Plan: Right proximal tibial fracture -Nonoperative management per orthopedics -Judicious pain control -NWBper Ortho -PT evaluation>>SNF -No signs of compartment syndrome presently  DVT/PE -IV heparin andmonitor clinically -transition to Xarelto 2/29  Acute Blood Loss anemia -due to bleeding from fracture -monitor Hgb--stable after initial drop -am CBC -remains hemodynamically stable  Atrial fibrillation, type  unspecified -Continue rivaroxaban -Continue metoprolol succinate  Essential hypertension -Continue amlodipine, lisinopril, metoprolol succinate     Disposition Plan: SNF on 3/2 if insurance authorizes Family Communication:Spouse updatedat bedside2/28  Consultants:Ortho  Code Status: FULL   DVT Prophylaxis:IVHeparin>>>Xarelto    Procedures: As Listed in Progress Note Above  Antibiotics: None  Subjective: Patient states that her right leg pain is under better control although she has occasional spasms with movement.  She denies any fevers, chills, chest pain, shortness of breath, nausea, vomiting, diarrhea, abdominal pain.  There is no dysuria or hematuria.  Objective: Vitals:   01/20/19 1600 01/20/19 2242 01/21/19 0432 01/21/19 1348  BP: 133/62 139/64 (!) 148/59 (!) 118/43  Pulse: 76 63 60 80  Resp: 17 18 19 16   Temp: 98.2 F (36.8 C) 98.8 F (37.1 C) 97.7 F (36.5 C) 98.2 F (36.8 C)  TempSrc: Oral Oral Oral Oral  SpO2: 98% 98% 98% 100%  Weight:      Height:        Intake/Output Summary (Last 24 hours) at 01/21/2019 1440 Last data filed at 01/21/2019 1100 Gross per 24 hour  Intake 808.49 ml  Output 600 ml  Net 208.49 ml   Weight change:  Exam:   General:  Pt is alert, follows commands appropriately, not in acute distress  HEENT: No icterus, No thrush, No neck mass, [place]/AT  Cardiovascular: IRRR, S1/S2, no rubs, no gallops  Respiratory: Fine bibasilar crackles.  No wheezing.  Good air movement  Abdomen: Soft/+BS, non tender, non distended, no guarding  Extremities: 1+ right lower extremity edema.  Dorsalis pedis and posterior tibial pulse on the right palpable.  Extremities and toes are warm on the right.  Calf compartment and thigh are soft with sensation intact   Data Reviewed: I have personally reviewed following labs and imaging studies  Basic Metabolic Panel: Recent Labs  Lab 01/18/19 2332 01/20/19 1350  NA 142 139  K  3.9 3.7  CL 108 106  CO2 25 24  GLUCOSE 97 124*  BUN 12 18  CREATININE 0.65 0.86  CALCIUM 9.4 8.4*   Liver Function Tests: No results for input(s): AST, ALT, ALKPHOS, BILITOT, PROT, ALBUMIN in the last 168 hours. No results for input(s): LIPASE, AMYLASE in the last 168 hours. No results for input(s): AMMONIA in the last 168 hours. Coagulation Profile: No results for input(s): INR, PROTIME in the last 168 hours. CBC: Recent Labs  Lab 01/18/19 2332 01/20/19 1350 01/21/19 0606  WBC 12.3* 11.7* 12.3*  NEUTROABS 9.6*  --   --   HGB 12.6 10.2* 10.0*  HCT 40.9 33.7* 33.0*  MCV 90.3 90.6 90.4  PLT 249 224 212   Cardiac Enzymes: No results for input(s): CKTOTAL, CKMB, CKMBINDEX, TROPONINI in the last 168 hours. BNP: Invalid input(s): POCBNP CBG: No results for input(s): GLUCAP in the last 168 hours. HbA1C: No results for input(s): HGBA1C in the last 72 hours. Urine analysis:    Component Value Date/Time   COLORURINE YELLOW 08/10/2017 1437   APPEARANCEUR HAZY (A) 08/10/2017 1437   LABSPEC 1.014 08/10/2017 1437   PHURINE 7.0 08/10/2017 1437   GLUCOSEU NEGATIVE 08/10/2017 1437   HGBUR SMALL (A) 08/10/2017 1437   BILIRUBINUR NEGATIVE 08/10/2017 1437   KETONESUR NEGATIVE 08/10/2017 1437   PROTEINUR NEGATIVE 08/10/2017 1437   NITRITE NEGATIVE 08/10/2017 1437   LEUKOCYTESUR MODERATE (A) 08/10/2017 1437   Sepsis Labs: @LABRCNTIP (procalcitonin:4,lacticidven:4) )No results found for this or any previous visit (from the past 240 hour(s)).   Scheduled Meds: . amLODipine  5 mg Oral QPM  . cholecalciferol  1,000 Units Oral Daily  . docusate sodium  100 mg Oral BID  . latanoprost  1 drop Both Eyes QHS  . lisinopril  20 mg Oral QPM  . metoprolol succinate  25 mg Oral q morning - 10a  . rivaroxaban  20 mg Oral Q supper  . sodium chloride flush  3 mL Intravenous Q12H   Continuous Infusions: . sodium chloride      Procedures/Studies: Dg Knee Complete 4 Views Right  Result  Date: 01/18/2019 CLINICAL DATA:  Fall, right knee pain EXAM: RIGHT KNEE - COMPLETE 4+ VIEW COMPARISON:  07/18/2018 FINDINGS: Fracture through the right proximal tibial metaphysis, nondisplaced. A vertical component may extend into the lateral tibial plateau. Fracture noted through the fibular neck and proximal fibular shaft. No joint effusion. Moderate tricompartment degenerative changes within the right knee. IMPRESSION: Proximal right tibial metaphyseal fracture with possible extension into the lateral plateau. Fibular neck and proximal fibular shaft fracture. Electronically Signed   By: Charlett Nose M.D.   On: 01/18/2019 21:14    Catarina Hartshorn, DO  [facility] Pager [phone]  If 7PM-7AM, please contact night-coverage [email or web address] Password [email or web address] 01/21/2019, 2:40 PM   LOS: 0 days

## 2019-01-22 DIAGNOSIS — I10 Essential (primary) hypertension: Secondary | ICD-10-CM | POA: Diagnosis not present

## 2019-01-22 DIAGNOSIS — I4891 Unspecified atrial fibrillation: Secondary | ICD-10-CM | POA: Diagnosis not present

## 2019-01-22 DIAGNOSIS — S82401A Unspecified fracture of shaft of right fibula, initial encounter for closed fracture: Secondary | ICD-10-CM | POA: Diagnosis not present

## 2019-01-22 DIAGNOSIS — S82201A Unspecified fracture of shaft of right tibia, initial encounter for closed fracture: Secondary | ICD-10-CM | POA: Diagnosis not present

## 2019-01-22 MED ORDER — ACETAMINOPHEN 325 MG PO TABS
650.0000 mg | ORAL_TABLET | Freq: Four times a day (QID) | ORAL | Status: DC | PRN
  Administered 2019-01-22: 650 mg via ORAL
  Filled 2019-01-22: qty 2

## 2019-01-22 NOTE — Progress Notes (Signed)
Physical Therapy Treatment Patient Details Name: Diana [name] MRN: [id number] DOB: 1934/06/13 Today's Date: 01/22/2019    History of Present Illness Diana [name] is a 85 y/o female s/p fall down steps with proximal tib/fib fracture right knee.  PHMx: DVT, PE, HTN.    PT Comments    Patient required additional assistance for bed mobility and transfers today. Patient unable to stand with max assist of 1 today. Patient appeared lethargic and limited by fatigue today. Performed bed level exercises. Recommend assist of 2 for transfers and ambulation attempts or use of Stedy to stand. Patient would continue to benefit from skilled physical therapy in current environment and next venue to continue return to prior function and increase strength, endurance, balance, coordination, and functional mobility and gait skills.    Follow Up Recommendations  SNF     Equipment Recommendations  None recommended by PT    Recommendations for Other Services       Precautions / Restrictions Restrictions Weight Bearing Restrictions: Yes RLE Weight Bearing: Non weight bearing    Mobility  Bed Mobility Overal bed mobility: Needs Assistance Bed Mobility: Supine to Sit;Sit to Supine     Supine to sit: Mod assist Sit to supine: Mod assist   General bed mobility comments: slow labored movement; mod assist to scoot up in bed with Trendelenburg positioning  Transfers Overall transfer level: Needs assistance Equipment used: Rolling walker (2 wheeled) Transfers: Sit to/from Stand Sit to Stand: Max assist         General transfer comment: unable to stand with max assist of 1  Ambulation/Gait             General Gait Details: patient unable to stand with max assist of 1   Stairs             Wheelchair Mobility    Modified Rankin (Stroke Patients Only)       Balance Overall balance assessment: Needs assistance Sitting-balance support: No upper extremity  supported;Feet supported Sitting balance-Leahy Scale: Fair     Standing balance support: Bilateral upper extremity supported Standing balance-Leahy Scale: Zero                              Cognition Arousal/Alertness: Awake/alert Behavior During Therapy: WFL for tasks assessed/performed Overall Cognitive Status: Within Functional Limits for tasks assessed                                        Exercises General Exercises - Lower Extremity Ankle Circles/Pumps: AROM;Strengthening;Both;10 reps;Supine Gluteal Sets: Strengthening;Both;10 reps;Supine Short Arc Quad: AAROM;Strengthening;Left;10 reps;Supine Heel Slides: AAROM;Strengthening;Left;10 reps;Supine    General Comments        Pertinent Vitals/Pain Pain Assessment: 0-10 Pain Score: 5  Pain Location: right knee with movement Pain Descriptors / Indicators: Sore Pain Intervention(s): Limited activity within patient's tolerance;Monitored during session;Repositioned    Home Living                      Prior Function            PT Goals (current goals can now be found in the care plan section)      Frequency    Min 3X/week      PT Plan Current plan remains appropriate    Co-evaluation  AM-PAC PT "6 Clicks" Mobility   Outcome Measure  Help needed turning from your back to your side while in a flat bed without using bedrails?: A Lot Help needed moving from lying on your back to sitting on the side of a flat bed without using bedrails?: A Lot Help needed moving to and from a bed to a chair (including a wheelchair)?: A Lot Help needed standing up from a chair using your arms (e.g., wheelchair or bedside chair)?: A Lot Help needed to walk in hospital room?: Total Help needed climbing 3-5 steps with a railing? : Total 6 Click Score: 10    End of Session Equipment Utilized During Treatment: Gait belt Activity Tolerance: Patient tolerated treatment  well;Patient limited by fatigue;Patient limited by lethargy Patient left: in bed;with call bell/phone within reach;with bed alarm set Nurse Communication: Mobility status PT Visit Diagnosis: Unsteadiness on feet (R26.81);Other abnormalities of gait and mobility (R26.89);Muscle weakness (generalized) (M62.81);History of falling (Z91.81)     Time: 4680-3212 PT Time Calculation (min) (ACUTE ONLY): 30 min  Charges:  $Therapeutic Exercise: 8-22 mins $Therapeutic Activity: 8-22 mins                     Katina Dung. Hartnett-Rands, MS, PT [place] PT [facility] [phone] 01/22/2019, 1:00 PM

## 2019-01-22 NOTE — Clinical Social Work Note (Signed)
Diana [name] at [facility] states that insurance remains pending.     Diana [name], Diana China, LCSW

## 2019-01-22 NOTE — Progress Notes (Signed)
PROGRESS NOTE  Diana [name] KGU:[id number] DOB: Jul 01, 1934 DOA: 01/18/2019 PCP: Kirstie Peri, MD  Brief History: 85 year old female with a history of DVT/PE on rivaroxaban, hypertension, hyperthyroidism,presenting with a mechanical fall when she caught her foot stuck between some stairs and fell down 2 steps. The patient landed on her right knee. Because of significant pain, she came to emergency department for further evaluation. X-ray of the right knee revealed a proximal right tibial metaphyseal fracture with possible extension into the lateral tibial plateau. The patient denied any prodromal symptoms or loss of consciousness. Orthopedic surgery, Dr. Romeo Apple was consulted. From an orthopedic standpoint, he felt that the patient could be treated withnonoperative management.The patient denies any fevers, chills, chest pain, shortness breath, palpitations, nausea, vomiting, diarrhea, dental pain. Notably, the patient states that she had a DVT in her left leg approximately 30 years ago when she was in [place]. She states that she was not anticoagulated, but details were sketchy. Nevertheless, the patient was diagnosed with a DVT in her left popliteal vein with small segmental PE in the right upper lobe and right lower lobe on 08/10/2016. The patient was initially treated with rivaroxaban. She took it for 6 months. However, in September 2019, the patient was diagnosed with atrial fibrillation and restarted on rivaroxaban.  Assessment/Plan: Right proximal tibial fracture -Nonoperative management per orthopedics -Judicious pain control -NWBper Ortho -PT evaluation>>SNF -No signs of compartment syndrome presently  DVT/PE -IV heparin andmonitor clinically -transitioned to Xarelto 2/29  Acute Blood Loss anemia -due to bleeding from fracture -monitor Hgb--stable after initial drop -am CBC -remains hemodynamically stable  Atrial fibrillation, type  unspecified -Continue rivaroxaban -Continue metoprolol succinate  Essential hypertension -Continue amlodipine, lisinopril, metoprolol succinate     Disposition Plan:SNF on 3/3 if insurance authorizes Family Communication:Spouse updatedat bedside2/28  Consultants:Ortho  Code Status: FULL   DVT Prophylaxis:IVHeparin>>>Xarelto   Procedures: As Listed in Progress Note Above  Antibiotics: None     Subjective: Pt complains of pain in right leg with PT.  Pain controlled with current pain regimen.  Patient denies fevers, chills, headache, chest pain, dyspnea, nausea, vomiting, diarrhea, abdominal pain, dysuria, hematuria, hematochezia, and melena.   Objective: Vitals:   01/21/19 1348 01/21/19 2136 01/22/19 0521 01/22/19 1331  BP: (!) 118/43 112/63 130/67 (!) 121/55  Pulse: 80 74 81 (!) 57  Resp: 16 16 17 20   Temp: 98.2 F (36.8 C) 98.8 F (37.1 C) 98.6 F (37 C) 98.4 F (36.9 C)  TempSrc: Oral Oral Oral Oral  SpO2: 100% 99% 98% 99%  Weight:      Height:        Intake/Output Summary (Last 24 hours) at 01/22/2019 1609 Last data filed at 01/22/2019 1300 Gross per 24 hour  Intake 840 ml  Output 1300 ml  Net -460 ml   Weight change:  Exam:   General:  Pt is alert, follows commands appropriately, not in acute distress  HEENT: No icterus, No thrush, No neck mass, [place]/AT  Cardiovascular: IRRR, S1/S2, no rubs, no gallops  Respiratory: fine bibasilar crackles, no wheeze  Abdomen: Soft/+BS, non tender, non distended, no guarding  Extremities: 1+RLE edema.  R-DP pulse palpable.  Right leg warm and soft calf and thigh   Data Reviewed: I have personally reviewed following labs and imaging studies Basic Metabolic Panel: Recent Labs  Lab 01/18/19 2332 01/20/19 1350  NA 142 139  K 3.9 3.7  CL 108 106  CO2 25 24  GLUCOSE 97 124*  BUN 12 18  CREATININE 0.65 0.86  CALCIUM 9.4 8.4*   Liver Function Tests: No results for input(s): AST,  ALT, ALKPHOS, BILITOT, PROT, ALBUMIN in the last 168 hours. No results for input(s): LIPASE, AMYLASE in the last 168 hours. No results for input(s): AMMONIA in the last 168 hours. Coagulation Profile: No results for input(s): INR, PROTIME in the last 168 hours. CBC: Recent Labs  Lab 01/18/19 2332 01/20/19 1350 01/21/19 0606  WBC 12.3* 11.7* 12.3*  NEUTROABS 9.6*  --   --   HGB 12.6 10.2* 10.0*  HCT 40.9 33.7* 33.0*  MCV 90.3 90.6 90.4  PLT 249 224 212   Cardiac Enzymes: No results for input(s): CKTOTAL, CKMB, CKMBINDEX, TROPONINI in the last 168 hours. BNP: Invalid input(s): POCBNP CBG: No results for input(s): GLUCAP in the last 168 hours. HbA1C: No results for input(s): HGBA1C in the last 72 hours. Urine analysis:    Component Value Date/Time   COLORURINE YELLOW 08/10/2017 1437   APPEARANCEUR HAZY (A) 08/10/2017 1437   LABSPEC 1.014 08/10/2017 1437   PHURINE 7.0 08/10/2017 1437   GLUCOSEU NEGATIVE 08/10/2017 1437   HGBUR SMALL (A) 08/10/2017 1437   BILIRUBINUR NEGATIVE 08/10/2017 1437   KETONESUR NEGATIVE 08/10/2017 1437   PROTEINUR NEGATIVE 08/10/2017 1437   NITRITE NEGATIVE 08/10/2017 1437   LEUKOCYTESUR MODERATE (A) 08/10/2017 1437   Sepsis Labs: @LABRCNTIP (procalcitonin:4,lacticidven:4) )No results found for this or any previous visit (from the past 240 hour(s)).   Scheduled Meds: . amLODipine  5 mg Oral QPM  . cholecalciferol  1,000 Units Oral Daily  . docusate sodium  100 mg Oral BID  . latanoprost  1 drop Both Eyes QHS  . lisinopril  20 mg Oral QPM  . metoprolol succinate  25 mg Oral q morning - 10a  . rivaroxaban  20 mg Oral Q supper  . sodium chloride flush  3 mL Intravenous Q12H   Continuous Infusions: . sodium chloride      Procedures/Studies: Dg Knee Complete 4 Views Right  Result Date: 01/18/2019 CLINICAL DATA:  Fall, right knee pain EXAM: RIGHT KNEE - COMPLETE 4+ VIEW COMPARISON:  07/18/2018 FINDINGS: Fracture through the right proximal  tibial metaphysis, nondisplaced. A vertical component may extend into the lateral tibial plateau. Fracture noted through the fibular neck and proximal fibular shaft. No joint effusion. Moderate tricompartment degenerative changes within the right knee. IMPRESSION: Proximal right tibial metaphyseal fracture with possible extension into the lateral plateau. Fibular neck and proximal fibular shaft fracture. Electronically Signed   By: Charlett Nose M.D.   On: 01/18/2019 21:14    Catarina Hartshorn, DO  [facility] Pager [phone]  If 7PM-7AM, please contact night-coverage [email or web address] Password [email or web address] 01/22/2019, 4:09 PM   LOS: 0 days

## 2019-01-23 DIAGNOSIS — S82491A Other fracture of shaft of right fibula, initial encounter for closed fracture: Secondary | ICD-10-CM | POA: Diagnosis present

## 2019-01-23 DIAGNOSIS — I482 Chronic atrial fibrillation, unspecified: Secondary | ICD-10-CM | POA: Diagnosis present

## 2019-01-23 DIAGNOSIS — Z86711 Personal history of pulmonary embolism: Secondary | ICD-10-CM | POA: Diagnosis not present

## 2019-01-23 DIAGNOSIS — I517 Cardiomegaly: Secondary | ICD-10-CM | POA: Diagnosis present

## 2019-01-23 DIAGNOSIS — D62 Acute posthemorrhagic anemia: Secondary | ICD-10-CM | POA: Diagnosis present

## 2019-01-23 DIAGNOSIS — I10 Essential (primary) hypertension: Secondary | ICD-10-CM | POA: Diagnosis not present

## 2019-01-23 DIAGNOSIS — Z87891 Personal history of nicotine dependence: Secondary | ICD-10-CM | POA: Diagnosis not present

## 2019-01-23 DIAGNOSIS — I872 Venous insufficiency (chronic) (peripheral): Secondary | ICD-10-CM | POA: Diagnosis present

## 2019-01-23 DIAGNOSIS — W109XXA Fall (on) (from) unspecified stairs and steps, initial encounter: Secondary | ICD-10-CM | POA: Diagnosis present

## 2019-01-23 DIAGNOSIS — Z7901 Long term (current) use of anticoagulants: Secondary | ICD-10-CM | POA: Diagnosis not present

## 2019-01-23 DIAGNOSIS — S82121D Displaced fracture of lateral condyle of right tibia, subsequent encounter for closed fracture with routine healing: Secondary | ICD-10-CM | POA: Diagnosis not present

## 2019-01-23 DIAGNOSIS — Z79899 Other long term (current) drug therapy: Secondary | ICD-10-CM | POA: Diagnosis not present

## 2019-01-23 DIAGNOSIS — M25561 Pain in right knee: Secondary | ICD-10-CM | POA: Diagnosis present

## 2019-01-23 DIAGNOSIS — I4891 Unspecified atrial fibrillation: Secondary | ICD-10-CM | POA: Diagnosis not present

## 2019-01-23 DIAGNOSIS — I2699 Other pulmonary embolism without acute cor pulmonale: Secondary | ICD-10-CM | POA: Diagnosis present

## 2019-01-23 DIAGNOSIS — S82141A Displaced bicondylar fracture of right tibia, initial encounter for closed fracture: Secondary | ICD-10-CM | POA: Diagnosis present

## 2019-01-23 DIAGNOSIS — Z791 Long term (current) use of non-steroidal anti-inflammatories (NSAID): Secondary | ICD-10-CM | POA: Diagnosis not present

## 2019-01-23 DIAGNOSIS — E0590 Thyrotoxicosis, unspecified without thyrotoxic crisis or storm: Secondary | ICD-10-CM | POA: Diagnosis present

## 2019-01-23 DIAGNOSIS — I82532 Chronic embolism and thrombosis of left popliteal vein: Secondary | ICD-10-CM | POA: Diagnosis present

## 2019-01-23 LAB — BASIC METABOLIC PANEL
Anion gap: 8 (ref 5–15)
BUN: 21 mg/dL (ref 8–23)
CALCIUM: 8.7 mg/dL — AB (ref 8.9–10.3)
CO2: 24 mmol/L (ref 22–32)
Chloride: 105 mmol/L (ref 98–111)
Creatinine, Ser: 0.57 mg/dL (ref 0.44–1.00)
GFR calc Af Amer: >60 mL/min (ref >60)
GFR calc non Af Amer: >60 mL/min (ref >60)
Glucose, Bld: 105 mg/dL — ABNORMAL HIGH (ref 70–99)
Potassium: 4.2 mmol/L (ref 3.5–5.1)
Sodium: 137 mmol/L (ref 135–145)

## 2019-01-23 LAB — LACTIC ACID, PLASMA: Lactic Acid, Venous: 0.7 mmol/L (ref 0.5–1.9)

## 2019-01-23 LAB — CBC
HCT: 32.7 % — ABNORMAL LOW (ref 36.0–46.0)
Hemoglobin: 10 g/dL — ABNORMAL LOW (ref 12.0–15.0)
MCH: 27.4 pg (ref 26.0–34.0)
MCHC: 30.6 g/dL (ref 30.0–36.0)
MCV: 89.6 fL (ref 80.0–100.0)
Platelets: 239 10*3/uL (ref 150–400)
RBC: 3.65 MIL/uL — ABNORMAL LOW (ref 3.87–5.11)
RDW: 15 % (ref 11.5–15.5)
WBC: 12.7 10*3/uL — ABNORMAL HIGH (ref 4.0–10.5)
nRBC: 0 % (ref 0.0–0.2)

## 2019-01-23 MED ORDER — METOPROLOL SUCCINATE ER 25 MG PO TB24: 25.0000 mg | ORAL_TABLET | Freq: Every morning | ORAL | 0 refills | Status: DC

## 2019-01-23 MED ORDER — DILTIAZEM HCL 30 MG PO TABS
30.0000 mg | ORAL_TABLET | Freq: Four times a day (QID) | ORAL | Status: DC
  Administered 2019-01-23: 30 mg via ORAL
  Filled 2019-01-23: qty 1

## 2019-01-23 MED ORDER — OXYCODONE HCL 5 MG PO TABS: 5.0000 mg | ORAL_TABLET | ORAL | 0 refills | Status: DC | PRN

## 2019-01-23 NOTE — Discharge Instructions (Signed)
BMP AND CBC IN ONE WEEK DR. STANLEY HARRISON IN 2 WEEKS

## 2019-01-23 NOTE — Clinical Social Work Placement (Signed)
   CLINICAL SOCIAL WORK PLACEMENT  NOTE  Date:  01/23/2019  Patient Details  Name: Diana [name] MRN: [id number] Date of Birth: 1934-11-21  Clinical Social Work is seeking post-discharge placement for this patient at the Skilled  Nursing Facility level of care (*CSW will initial, date and re-position this form in  chart as items are completed):  Yes   Patient/family provided with [place] Clinical Social Work Department's list of facilities offering this level of care within the geographic area requested by the patient (or if unable, by the patient's family).  Yes   Patient/family informed of their freedom to choose among providers that offer the needed level of care, that participate in Medicare, Medicaid or managed care program needed by the patient, have an available bed and are willing to accept the patient.  Yes   Patient/family informed of [facility]'s ownership interest in [facility] and [facility], as well as of the fact that they are under no obligation to receive care at these facilities.  PASRR submitted to EDS on 01/19/19     PASRR number received on 01/19/19     Existing PASRR number confirmed on       FL2 transmitted to all facilities in geographic area requested by pt/family on 01/19/19     FL2 transmitted to all facilities within larger geographic area on       Patient informed that his/her managed care company has contracts with or will negotiate with certain facilities, including the following:        Yes   Patient/family informed of bed offers received.  Patient chooses bed at [facility]     Physician recommends and patient chooses bed at      Patient to be transferred to [facility] on 01/23/19.  Patient to be transferred to facility by [facility]     Patient family notified on 01/23/19 of transfer.  Name of family member notified:  daughter, Ms. Normand Sloop.     PHYSICIAN       Additional Comment:  Discharge  clinicals sent to facility. Facility aware of discharge. RN to call report and EMS when ready. LCSW signing off.   _______________________________________________ Annice Needy, LCSW 01/23/2019, 2:51 PM

## 2019-01-23 NOTE — Discharge Summary (Signed)
Physician Discharge Summary  Diana [name] ZOX:[id number] DOB: [birth year] DOA: 01/18/2019  PCP: Kirstie Peri, MD  Admit date: 01/18/2019 Discharge date: 01/23/2019  Admitted From:Home Disposition: SNF  Recommendations for Outpatient Follow-up:  1. Follow up with PCP in 1-2 weeks 2. Please obtain BMP/CBC in one week 3. Follow up ortho, Dr. [name], in 2 weeks    Discharge Condition: Stable CODE STATUS: FULL Diet recommendation: Heart Healthy   Brief/Interim Summary: 85 year old female with a history of DVT/PE on rivaroxaban, hypertension, hyperthyroidism,presenting with a mechanical fall when she caught her foot stuck between some stairs and fell down 2 steps. The patient landed on her right knee. Because of significant pain, she came to emergency department for further evaluation. X-ray of the right knee revealed a proximal right tibial metaphyseal fracture with possible extension into the lateral tibial plateau. The patient denied any prodromal symptoms or loss of consciousness. Orthopedic surgery, Dr. Romeo Apple was consulted. From an orthopedic standpoint, he felt that the patient could be treated withnonoperative management.The patient denies any fevers, chills, chest pain, shortness breath, palpitations, nausea, vomiting, diarrhea, dental pain. Notably, the patient states that she had a DVT in her left leg approximately 30 years ago when she was in [place]. She states that she was not anticoagulated, but details were sketchy. Nevertheless, the patient was diagnosed with a DVT in her left popliteal vein with small segmental PE in the right upper lobe and right lower lobe on 08/10/2016. The patient was initially treated with rivaroxaban. She took it for 6 months. However, in September 2019, the patient was diagnosed with atrial fibrillation and restarted on rivaroxaban. On the evening of 01/22/19, pt have low grade temp of 100.4, and she subsequently had RVR  with afib which was brief.  She remained hemodynamically stable and HR remained controlled.  Discharge Diagnoses:  Right proximal tibial fracture -Nonoperative management per orthopedics -Judicious pain control -NWBper Ortho -PT evaluation>>SNF -No signs of compartment syndrome presently  Fever -low grade, 100.4--evening 01/22/19 -no further fevers there after  -remained hemodynamically stable -100% on RA without dyspnea  DVT/PE -IV heparin andmonitor clinically -transitionedto Xarelto 2/29  Acute Blood Loss anemia -due to bleeding from fracture -monitor Hgb--stable after initial drop -Hgb 10.0 on day of d/c -remains hemodynamically stable  Atrial fibrillation, type unspecified -Continue rivaroxaban -Continue metoprolol succinate--increased from 12.5 mg to 25 mg daily -had brief episode of RVR due to acute medical illness  Essential hypertension -Continue metoprolol succinate -continue amlodipine and lisinopril   Discharge Instructions   Allergies as of 01/23/2019   No Known Allergies     Medication List    STOP taking these medications   cephALEXin 250 MG capsule Commonly known as:  KEFLEX     TAKE these medications   amLODipine 5 MG tablet Commonly known as:  NORVASC Take 5 mg by mouth every evening.   cholecalciferol 25 MCG (1000 UT) tablet Commonly known as:  VITAMIN D3 Take 1,000 Units by mouth daily.   latanoprost 0.005 % ophthalmic solution Commonly known as:  XALATAN Place 1 drop into both eyes at bedtime.   lisinopril 20 MG tablet Commonly known as:  PRINIVIL,ZESTRIL Take 20 mg by mouth every evening.   meclizine 12.5 MG tablet Commonly known as:  ANTIVERT Take 12.5 mg by mouth 3 (three) times daily as needed for dizziness or nausea.   metoprolol succinate 25 MG 24 hr tablet Commonly known as:  TOPROL-XL Take 1 tablet (25 mg total) by mouth every morning. Start taking  on:  January 24, 2019   oxyCODONE 5 MG immediate release  tablet Commonly known as:  Oxy IR/ROXICODONE Take 1 tablet (5 mg total) by mouth every 4 (four) hours as needed for moderate pain.   rivaroxaban 20 MG Tabs tablet Commonly known as:  XARELTO Take 20 mg by mouth daily.      Contact information for after-discharge care    Destination    [facility] Preferred SNF .   Service:  Skilled Nursing Contact information: [address] [place] [address] [phone]             No Known Allergies  Consultations:  Jonita Albee   Procedures/Studies: Dg Knee Complete 4 Views Right  Result Date: 01/18/2019 CLINICAL DATA:  Fall, right knee pain EXAM: RIGHT KNEE - COMPLETE 4+ VIEW COMPARISON:  07/18/2018 FINDINGS: Fracture through the right proximal tibial metaphysis, nondisplaced. A vertical component may extend into the lateral tibial plateau. Fracture noted through the fibular neck and proximal fibular shaft. No joint effusion. Moderate tricompartment degenerative changes within the right knee. IMPRESSION: Proximal right tibial metaphyseal fracture with possible extension into the lateral plateau. Fibular neck and proximal fibular shaft fracture. Electronically Signed   By: Charlett Nose M.D.   On: 01/18/2019 21:14        Discharge Exam: Vitals:   01/22/19 2150 01/23/19 0633  BP: (!) 161/56 134/76  Pulse: 60 (!) 119  Resp:  18  Temp: (!) 100.4 F (38 C) 98.4 F (36.9 C)  SpO2: 99% 98%   Vitals:   01/22/19 0521 01/22/19 1331 01/22/19 2150 01/23/19 0633  BP: 130/67 (!) 121/55 (!) 161/56 134/76  Pulse: 81 (!) 57 60 (!) 119  Resp: 17 20  18   Temp: 98.6 F (37 C) 98.4 F (36.9 C) (!) 100.4 F (38 C) 98.4 F (36.9 C)  TempSrc: Oral Oral Oral Oral  SpO2: 98% 99% 99% 98%  Weight:      Height:        General: Pt is alert, awake, not in acute distress Cardiovascular: IRRR, S1/S2 +, no rubs, no gallops Respiratory: CTA bilaterally, no wheezing, no  rhonchi Abdominal: Soft, NT, ND, bowel sounds + Extremities: right leg with 1+edema and ecchymosis below knee. Calf is soft/ leg is warm.  DP pulse present   The results of significant diagnostics from this hospitalization (including imaging, microbiology, ancillary and laboratory) are listed below for reference.    Significant Diagnostic Studies: Dg Knee Complete 4 Views Right  Result Date: 01/18/2019 CLINICAL DATA:  Fall, right knee pain EXAM: RIGHT KNEE - COMPLETE 4+ VIEW COMPARISON:  07/18/2018 FINDINGS: Fracture through the right proximal tibial metaphysis, nondisplaced. A vertical component may extend into the lateral tibial plateau. Fracture noted through the fibular neck and proximal fibular shaft. No joint effusion. Moderate tricompartment degenerative changes within the right knee. IMPRESSION: Proximal right tibial metaphyseal fracture with possible extension into the lateral plateau. Fibular neck and proximal fibular shaft fracture. Electronically Signed   By: Charlett Nose M.D.   On: 01/18/2019 21:14     Microbiology: No results found for this or any previous visit (from the past 240 hour(s)).   Labs: Basic Metabolic Panel: Recent Labs  Lab 01/18/19 2332 01/20/19 1350 01/23/19 1045  NA 142 139 137  K 3.9 3.7 4.2  CL 108 106 105  CO2 25 24 24   GLUCOSE 97 124* 105*  BUN 12 18 21   CREATININE 0.65 0.86 0.57  CALCIUM  9.4 8.4* 8.7*   Liver Function Tests: No results for input(s): AST, ALT, ALKPHOS, BILITOT, PROT, ALBUMIN in the last 168 hours. No results for input(s): LIPASE, AMYLASE in the last 168 hours. No results for input(s): AMMONIA in the last 168 hours. CBC: Recent Labs  Lab 01/18/19 2332 01/20/19 1350 01/21/19 0606 01/23/19 0504  WBC 12.3* 11.7* 12.3* 12.7*  NEUTROABS 9.6*  --   --   --   HGB 12.6 10.2* 10.0* 10.0*  HCT 40.9 33.7* 33.0* 32.7*  MCV 90.3 90.6 90.4 89.6  PLT 249 224 212 239   Cardiac Enzymes: No results for input(s): CKTOTAL, CKMB,  CKMBINDEX, TROPONINI in the last 168 hours. BNP: Invalid input(s): POCBNP CBG: No results for input(s): GLUCAP in the last 168 hours.  Time coordinating discharge:  36 minutes  Signed:  Catarina Hartshorn, DO [facility] Pager: [phone] 01/23/2019, 2:00 PM

## 2019-01-23 NOTE — Progress Notes (Addendum)
PROGRESS NOTE  Diana [name] OIB:[id number] DOB: 06-11-1934 DOA: 01/18/2019 PCP: Kirstie Peri, MD  Brief History: 85 year old female with a history of DVT/PE on rivaroxaban, hypertension, hyperthyroidism,presenting with a mechanical fall when she caught her foot stuck between some stairs and fell down 2 steps. The patient landed on her right knee. Because of significant pain, she came to emergency department for further evaluation. X-ray of the right knee revealed a proximal right tibial metaphyseal fracture with possible extension into the lateral tibial plateau. The patient denied any prodromal symptoms or loss of consciousness. Orthopedic surgery, Dr. Romeo Apple was consulted. From an orthopedic standpoint, he felt that the patient could be treated withnonoperative management.The patient denies any fevers, chills, chest pain, shortness breath, palpitations, nausea, vomiting, diarrhea, dental pain. Notably, the patient states that she had a DVT in her left leg approximately 30 years ago when she was in [place]. She states that she was not anticoagulated, but details were sketchy. Nevertheless, the patient was diagnosed with a DVT in her left popliteal vein with small segmental PE in the right upper lobe and right lower lobe on 08/10/2016. The patient was initially treated with rivaroxaban. She took it for 6 months. However, in September 2019, the patient was diagnosed with atrial fibrillation and restarted on rivaroxaban. On the evening of 01/22/19, pt have low grade temp of 100.4, and she subsequently had RVR with afib.  Assessment/Plan: Right proximal tibial fracture -Nonoperative management per orthopedics -Judicious pain control -NWBper Ortho -PT evaluation>>SNF -No signs of compartment syndrome presently  Fever -lactic 0.6 -100% on RA without dyspnea -Tmax 1004 -now afebrile -likely due to hematoma in right leg  DVT/PE -IV heparin andmonitor  clinically -transitioned to Xarelto 2/29  Acute Blood Loss anemia -due to bleeding from fracture -monitor Hgb--stable after initial drop -am CBC -remains hemodynamically stable  Atrial fibrillation, type unspecified--now with RVR -Continue rivaroxaban -Continue metoprolol succinate-->increased to 25 mg daily -short brief RVR episodes   Essential hypertension -Continue metoprolol succinate -continue amlodipine and lisinopril     Disposition Plan:SNF on 3/4 if insurance authorizes and medically stable Family Communication:Spouse updatedat bedside3/3  Consultants:Ortho  Code Status: FULL   DVT Prophylaxis:IVHeparin>>>Xarelto   Procedures: As Listed in Progress Note Above  Antibiotics: None      Subjective: Patient denies fevers, chills, headache, chest pain, dyspnea, nausea, vomiting, diarrhea, abdominal pain, dysuria, hematuria, hematochezia, and melena. C/o pain in right leg about same.  Objective: Vitals:   01/22/19 0521 01/22/19 1331 01/22/19 2150 01/23/19 0633  BP: 130/67 (!) 121/55 (!) 161/56 134/76  Pulse: 81 (!) 57 60 (!) 119  Resp: 17 20  18   Temp: 98.6 F (37 C) 98.4 F (36.9 C) (!) 100.4 F (38 C) 98.4 F (36.9 C)  TempSrc: Oral Oral Oral Oral  SpO2: 98% 99% 99% 98%  Weight:      Height:        Intake/Output Summary (Last 24 hours) at 01/23/2019 1018 Last data filed at 01/22/2019 1300 Gross per 24 hour  Intake 240 ml  Output 450 ml  Net -210 ml   Weight change:  Exam:   General:  Pt is alert, follows commands appropriately, not in acute distress  HEENT: No icterus, No thrush, No neck mass, [place]/AT  Cardiovascular: IRRR, S1/S2, no rubs, no gallops  Respiratory: CTA bilaterally, no wheezing, no crackles, no rhonchi  Abdomen: Soft/+BS, non tender, non distended, no guarding  Extremities: right leg with 1+edema and ecchymosis  below knee. Calf is soft/ leg is warm.  DP pulse present   Data Reviewed: I  have personally reviewed following labs and imaging studies Basic Metabolic Panel: Recent Labs  Lab 01/18/19 2332 01/20/19 1350  NA 142 139  K 3.9 3.7  CL 108 106  CO2 25 24  GLUCOSE 97 124*  BUN 12 18  CREATININE 0.65 0.86  CALCIUM 9.4 8.4*   Liver Function Tests: No results for input(s): AST, ALT, ALKPHOS, BILITOT, PROT, ALBUMIN in the last 168 hours. No results for input(s): LIPASE, AMYLASE in the last 168 hours. No results for input(s): AMMONIA in the last 168 hours. Coagulation Profile: No results for input(s): INR, PROTIME in the last 168 hours. CBC: Recent Labs  Lab 01/18/19 2332 01/20/19 1350 01/21/19 0606 01/23/19 0504  WBC 12.3* 11.7* 12.3* 12.7*  NEUTROABS 9.6*  --   --   --   HGB 12.6 10.2* 10.0* 10.0*  HCT 40.9 33.7* 33.0* 32.7*  MCV 90.3 90.6 90.4 89.6  PLT 249 224 212 239   Cardiac Enzymes: No results for input(s): CKTOTAL, CKMB, CKMBINDEX, TROPONINI in the last 168 hours. BNP: Invalid input(s): POCBNP CBG: No results for input(s): GLUCAP in the last 168 hours. HbA1C: No results for input(s): HGBA1C in the last 72 hours. Urine analysis:    Component Value Date/Time   COLORURINE YELLOW 08/10/2017 1437   APPEARANCEUR HAZY (A) 08/10/2017 1437   LABSPEC 1.014 08/10/2017 1437   PHURINE 7.0 08/10/2017 1437   GLUCOSEU NEGATIVE 08/10/2017 1437   HGBUR SMALL (A) 08/10/2017 1437   BILIRUBINUR NEGATIVE 08/10/2017 1437   KETONESUR NEGATIVE 08/10/2017 1437   PROTEINUR NEGATIVE 08/10/2017 1437   NITRITE NEGATIVE 08/10/2017 1437   LEUKOCYTESUR MODERATE (A) 08/10/2017 1437   Sepsis Labs: @LABRCNTIP (procalcitonin:4,lacticidven:4) )No results found for this or any previous visit (from the past 240 hour(s)).   Scheduled Meds: . cholecalciferol  1,000 Units Oral Daily  . diltiazem  30 mg Oral Q6H  . docusate sodium  100 mg Oral BID  . latanoprost  1 drop Both Eyes QHS  . metoprolol succinate  25 mg Oral q morning - 10a  . rivaroxaban  20 mg Oral Q supper   . sodium chloride flush  3 mL Intravenous Q12H   Continuous Infusions: . sodium chloride      Procedures/Studies: Dg Knee Complete 4 Views Right  Result Date: 01/18/2019 CLINICAL DATA:  Fall, right knee pain EXAM: RIGHT KNEE - COMPLETE 4+ VIEW COMPARISON:  07/18/2018 FINDINGS: Fracture through the right proximal tibial metaphysis, nondisplaced. A vertical component may extend into the lateral tibial plateau. Fracture noted through the fibular neck and proximal fibular shaft. No joint effusion. Moderate tricompartment degenerative changes within the right knee. IMPRESSION: Proximal right tibial metaphyseal fracture with possible extension into the lateral plateau. Fibular neck and proximal fibular shaft fracture. Electronically Signed   By: Charlett Nose M.D.   On: 01/18/2019 21:14    Catarina Hartshorn, DO  [facility] Pager [phone]  If 7PM-7AM, please contact night-coverage [email or web address] Password [email or web address] 01/23/2019, 10:18 AM   LOS: 0 days

## 2019-01-23 NOTE — Progress Notes (Signed)
IV'S removed and report called to Dailey at [facility] in [place].  EMS called

## 2019-02-07 ENCOUNTER — Ambulatory Visit (INDEPENDENT_AMBULATORY_CARE_PROVIDER_SITE_OTHER): Payer: Medicare HMO

## 2019-02-07 ENCOUNTER — Ambulatory Visit (INDEPENDENT_AMBULATORY_CARE_PROVIDER_SITE_OTHER): Payer: Medicare HMO | Admitting: Orthopedic Surgery

## 2019-02-07 ENCOUNTER — Other Ambulatory Visit: Payer: Self-pay

## 2019-02-07 ENCOUNTER — Encounter: Payer: Self-pay | Admitting: Orthopedic Surgery

## 2019-02-07 VITALS — BP 143/74 | HR 57 | Ht 62.5 in

## 2019-02-07 DIAGNOSIS — S82401A Unspecified fracture of shaft of right fibula, initial encounter for closed fracture: Secondary | ICD-10-CM

## 2019-02-07 DIAGNOSIS — S82201A Unspecified fracture of shaft of right tibia, initial encounter for closed fracture: Secondary | ICD-10-CM | POA: Diagnosis not present

## 2019-02-07 NOTE — Progress Notes (Signed)
Patient ID: Diana [name], female   DOB: [birth year], 85 y.o.   MRN: [id number]  FRACTURE CARE   Chief Complaint  Patient presents with  . Leg Injury    right lower leg injury 01/18/2019    PROXIMAL TIBIAL PLATEAU FRACTURE   CURRENT TREATMENT; KNEE IMMOB NWB    Brief/Interim Summary: 85 year old female with a history of DVT/PE on rivaroxaban, hypertension, hyperthyroidism, presenting with a mechanical fall when she caught her foot stuck between some stairs and fell down 2 steps.  The patient landed on her right knee.  Because of significant pain, she came to emergency department for further evaluation.  X-ray of the right knee revealed a proximal right tibial metaphyseal fracture with possible extension into the lateral tibial plateau.  The patient denied any prodromal symptoms or loss of consciousness.  Orthopedic surgery, Dr. Romeo Apple was consulted.  From an orthopedic standpoint, he felt that the patient could be treated with nonoperative management.  Assessment/Plan: X-ray does indeed show a metaphyseal fracture of the proximal tibia with an associated fibular fracture.   This is a low energy mechanism with nondisplaced fractures which should be amenable to nonoperative treatment however, I have discussed with the patient displacement will require surgery which would be risky because of her atrial fibrillation chronic Xarelto therapy.   In the short-term we will monitor for compartment syndrome I changed her to a longer knee immobilizer Consult vascular surgery regarding long-term Xarelto therapy Weightbearing status no weightbearing for 6 weeks   Diana [name] 01/19/2019, 12:09 PM     Current Outpatient Medications:  .  amLODipine (NORVASC) 5 MG tablet, Take 5 mg by mouth every evening., Disp: , Rfl:  .  cholecalciferol (VITAMIN D3) 25 MCG (1000 UT) tablet, Take 1,000 Units by mouth daily., Disp: , Rfl:  .  latanoprost (XALATAN) 0.005 % ophthalmic solution, Place 1 drop  into both eyes at bedtime., Disp: , Rfl:  .  lisinopril (PRINIVIL,ZESTRIL) 20 MG tablet, Take 20 mg by mouth every evening. , Disp: , Rfl:  .  meclizine (ANTIVERT) 12.5 MG tablet, Take 12.5 mg by mouth 3 (three) times daily as needed for dizziness or nausea. , Disp: , Rfl:  .  metoprolol succinate (TOPROL-XL) 25 MG 24 hr tablet, Take 1 tablet (25 mg total) by mouth every morning., Disp: 30 tablet, Rfl: 0 .  oxyCODONE (OXY IR/ROXICODONE) 5 MG immediate release tablet, Take 1 tablet (5 mg total) by mouth every 4 (four) hours as needed for moderate pain., Disp: 10 tablet, Rfl: 0 .  rivaroxaban (XARELTO) 20 MG TABS tablet, Take 20 mg by mouth daily., Disp: , Rfl:    BP (!) 143/74   Pulse (!) 57   Ht 5' 2.5" (1.588 m)   BMI 36.90 kg/m     Encounter Diagnosis  Name Primary?  . Tibia/fibula fracture, right, closed, initial encounter Yes   X-rays today show fracture stable no change in position  Skin shows a few blisters distally they were small they were covered with Xeroform and gauze   POST INJURY DAY: 20  GLOBAL PERIOD DAY 19/90   PLAN:    Knee immobilizer // no weightbearing  X-ray in 3 weeks

## 2019-02-07 NOTE — Patient Instructions (Signed)
NON WEIGHT BEARING RIGHT LEG X 3 WEEKS  FOLLOW UP 3 WEEKS  KNEE BRACE NEEDS TO BE CENTERED ON THE KNEE

## 2019-02-14 ENCOUNTER — Telehealth: Payer: Self-pay | Admitting: Radiology

## 2019-02-14 ENCOUNTER — Telehealth: Payer: Self-pay | Admitting: Orthopedic Surgery

## 2019-02-14 NOTE — Telephone Encounter (Signed)
I called to advise.  

## 2019-02-14 NOTE — Telephone Encounter (Signed)
Can she come out of the immobilizer to sleep?

## 2019-02-14 NOTE — Telephone Encounter (Signed)
Duplicate, see previous

## 2019-02-14 NOTE — Telephone Encounter (Signed)
yes

## 2019-02-14 NOTE — Telephone Encounter (Signed)
I called Kathlene November to advise He is asking if he can do passive rom with her, please advise

## 2019-02-14 NOTE — Telephone Encounter (Signed)
Knee immobilizer // no weightbearing I called advised work with transfers, keep within these restrictions

## 2019-02-14 NOTE — Telephone Encounter (Signed)
[name] with [facility] needs verification on what the patient is able to do if any. He is at the patient's home right now please call and advise. Her next appointment is 02/28/19 @ 2:30 pm.  [name]'s number is [phone]

## 2019-02-15 NOTE — Telephone Encounter (Signed)
Yes

## 2019-02-16 NOTE — Telephone Encounter (Signed)
I have called to advise.  

## 2019-02-28 ENCOUNTER — Ambulatory Visit (INDEPENDENT_AMBULATORY_CARE_PROVIDER_SITE_OTHER): Payer: Medicare HMO

## 2019-02-28 ENCOUNTER — Ambulatory Visit (INDEPENDENT_AMBULATORY_CARE_PROVIDER_SITE_OTHER): Payer: Medicare HMO | Admitting: Orthopedic Surgery

## 2019-02-28 ENCOUNTER — Other Ambulatory Visit: Payer: Self-pay

## 2019-02-28 ENCOUNTER — Encounter: Payer: Self-pay | Admitting: Orthopedic Surgery

## 2019-02-28 DIAGNOSIS — S82121D Displaced fracture of lateral condyle of right tibia, subsequent encounter for closed fracture with routine healing: Secondary | ICD-10-CM | POA: Diagnosis not present

## 2019-02-28 NOTE — Patient Instructions (Signed)
Weight-bear as tolerated in the brace  Start 25 knee bends 3 times a day

## 2019-02-28 NOTE — Progress Notes (Signed)
Chief Complaint  Patient presents with  . Leg Injury    01/18/19 right leg tib fracture    Encounter Diagnosis  Name Primary?  . Closed fracture of lateral portion of right tibial plateau, with routine healing, subsequent encounter 01/18/19 Yes    Fracture follow-up proximal tibia fracture at the metaphyseal fracture and involves the fibula as well  Main complaint is 3 wounds on the lateral side of the leg which she says she is getting some type of wound care.  The dressing being used is unfamiliar to me  Previous history Brief/Interim Summary: 85 year old female with a history of DVT/PE on rivaroxaban, hypertension, hyperthyroidism, presenting with a mechanical fall when she caught her foot stuck between some stairs and fell down 2 steps.  The patient landed on her right knee.  Because of significant pain, she came to emergency department for further evaluation.  X-ray of the right knee revealed a proximal right tibial metaphyseal fracture with possible extension into the lateral tibial plateau.   X-ray no displacement or angulation with good fracture callus at 6 weeks  I saw her on the 28th so this is day 40 of 90  Brace and range of motion exercises  6-week x-ray

## 2019-02-28 NOTE — Addendum Note (Signed)
Addended byCaffie Damme on: 02/28/2019 11:33 AM   Modules accepted: Orders

## 2019-04-11 ENCOUNTER — Ambulatory Visit: Payer: Self-pay | Admitting: Orthopedic Surgery

## 2019-04-13 ENCOUNTER — Ambulatory Visit: Payer: Medicare HMO | Admitting: Orthopedic Surgery

## 2019-04-20 ENCOUNTER — Telehealth: Payer: Self-pay | Admitting: Radiology

## 2019-04-20 ENCOUNTER — Ambulatory Visit (INDEPENDENT_AMBULATORY_CARE_PROVIDER_SITE_OTHER): Payer: Medicare HMO | Admitting: Orthopedic Surgery

## 2019-04-20 ENCOUNTER — Other Ambulatory Visit: Payer: Self-pay

## 2019-04-20 ENCOUNTER — Ambulatory Visit (INDEPENDENT_AMBULATORY_CARE_PROVIDER_SITE_OTHER): Payer: Medicare HMO

## 2019-04-20 VITALS — Temp 97.3°F

## 2019-04-20 DIAGNOSIS — S82121D Displaced fracture of lateral condyle of right tibia, subsequent encounter for closed fracture with routine healing: Secondary | ICD-10-CM

## 2019-04-20 MED ORDER — HYDROCODONE-ACETAMINOPHEN 5-325 MG PO TABS: 1.0000 | ORAL_TABLET | Freq: Four times a day (QID) | ORAL | 0 refills | Status: DC | PRN

## 2019-04-20 NOTE — Progress Notes (Signed)
Chief Complaint  Patient presents with  . Follow-up    Recheck on right tibial plateau fracture, DOI 01-18-19.   PLAYMAKER BRACE   Day 92 after right tibial proximal shaft fracture with corresponding fibular fracture  X-ray looks great  He is tender at the fracture site  She says most days it does not hurt but she is hurting today from moving around  I want to keep her in the playmaker brace because she has only extension from 90 - 40 indicating quadriceps weakness  She is getting therapy at home but seems like they are needed and put up a little  Stop oxycodone she can take hydrocodone for 4 weeks and then stop that  Meds ordered this encounter  Medications  . HYDROcodone-acetaminophen (NORCO/VICODIN) 5-325 MG tablet    Sig: Take 1 tablet by mouth every 6 (six) hours as needed for moderate pain.    Dispense:  30 tablet    Refill:  0

## 2019-04-20 NOTE — Telephone Encounter (Signed)
Left message for [facility] because Dr Romeo Apple wants her therapy "stepped up"

## 2019-05-14 ENCOUNTER — Telehealth: Payer: Self-pay | Admitting: Orthopedic Surgery

## 2019-05-14 NOTE — Telephone Encounter (Signed)
Call received (voice message) from [place] at [facility]; relaying that patient's home therapy has been ongoing, although due to insurance and also level of progress at home, that patient will be ready to be discharged to out-patient therapy. Patient's next follow up visit with Dr Aline Brochure is scheduled this Friday, 05/18/19; wanted to notify about status of therapy in advance of the appointment. If questions, please call [phone].

## 2019-05-14 NOTE — Telephone Encounter (Signed)
Ok noted. thanks

## 2019-05-18 ENCOUNTER — Encounter: Payer: Self-pay | Admitting: Orthopedic Surgery

## 2019-05-18 ENCOUNTER — Other Ambulatory Visit: Payer: Self-pay

## 2019-05-18 ENCOUNTER — Ambulatory Visit (INDEPENDENT_AMBULATORY_CARE_PROVIDER_SITE_OTHER): Payer: Medicare HMO | Admitting: Orthopedic Surgery

## 2019-05-18 VITALS — BP 157/86 | HR 85 | Temp 98.2°F | Ht 62.5 in | Wt 205.0 lb

## 2019-05-18 DIAGNOSIS — S82121D Displaced fracture of lateral condyle of right tibia, subsequent encounter for closed fracture with routine healing: Secondary | ICD-10-CM | POA: Diagnosis not present

## 2019-05-18 MED ORDER — HYDROCODONE-ACETAMINOPHEN 5-325 MG PO TABS: 1.0000 | ORAL_TABLET | Freq: Four times a day (QID) | ORAL | 0 refills | Status: DC | PRN

## 2019-05-18 NOTE — Progress Notes (Signed)
Chief Complaint  Patient presents with  . Fracture    Rt knee DOI 01/18/19   85 year old female had a fracture of her tibial plateau she has had a slow course and is still having trouble walking even after physical therapy.  According to the therapist they have done all they can do but the patient is still not ambulatory.  She has weakness in extension decreased range of motion left knee lateral joint line tenderness  We ordered a brace for her as she did not tolerate the economy hinged brace size wise and said the playmaker brace was too heavy  She says she still needs some pain medication although right now 4 months out from her fracture  I looked at the x-ray one more time  She had a proximal tib-fib fracture at the metaphyseal diaphyseal junction which healed nicely with no displacement last x-ray taken Apr 20, 2019  Her active range of motion shows a 30 degree extensor lag and active flexion is 100 degrees passively she has approximately 20 degrees to 105 degrees with no instability in the knee  She is getting a refill for hydrocodone and a brace order from [facility]  Asked for another order for therapy  Fortunately or unfortunately the patient does not need any surgery she needs rehab not confident that she is getting a good therapeutic intervention at home but she has no transportation to get to a therapist as an outpatient  I will see her in 6 weeks after 6 more weeks of therapy   Meds ordered this encounter  Medications  . HYDROcodone-acetaminophen (NORCO/VICODIN) 5-325 MG tablet    Sig: Take 1 tablet by mouth every 6 (six) hours as needed for moderate pain.    Dispense:  30 tablet    Refill:  0

## 2019-05-21 ENCOUNTER — Telehealth: Payer: Self-pay | Admitting: Orthopedic Surgery

## 2019-05-21 NOTE — Telephone Encounter (Signed)
Call back from [facility] at [facility] care, per Amy's call earlier today, relays physical therapist will re-start home therapy visits tomorrow, 05/22/19.

## 2019-05-22 ENCOUNTER — Telehealth: Payer: Self-pay | Admitting: Radiology

## 2019-05-22 NOTE — Telephone Encounter (Signed)
Ok great.

## 2019-05-22 NOTE — Telephone Encounter (Signed)
Amy from [facility] calling for verbal orders for home therapy - transferring to Amy

## 2019-05-22 NOTE — Telephone Encounter (Signed)
Diana [name] with [facility] called today to advise she will work with Diana [name] 2 x weekly for therapy, I have advised Dr Aline Brochure wants her to have gait training and to be more mobile  Diana [name] states Diana [name] fell, and is asking if she wants [place] to hold therapy or order mobile xray, as Diana [name] is asking for xray since her knee is sore. I have advised mobile xray not great idea, we can not view the images. I have advised not to hold therapy since Dr Aline Brochure has wanted her to get up and go with therapy, Diana [name] can come in here on Monday if it is still sore, made appointment for her, but did not hold therapy since Diana [name] has not progressed, and Dr Aline Brochure is disappointed with her progress thus far. Diana [name] will cancel appointment if her soreness improves this week or over the weekend.   To you FYI, unless you need anything further with this.

## 2019-05-28 ENCOUNTER — Ambulatory Visit: Payer: Medicare HMO | Admitting: Orthopedic Surgery

## 2019-07-02 ENCOUNTER — Ambulatory Visit (INDEPENDENT_AMBULATORY_CARE_PROVIDER_SITE_OTHER): Payer: Medicare HMO | Admitting: Orthopedic Surgery

## 2019-07-02 ENCOUNTER — Other Ambulatory Visit: Payer: Self-pay

## 2019-07-02 ENCOUNTER — Encounter: Payer: Self-pay | Admitting: Orthopedic Surgery

## 2019-07-02 VITALS — BP 202/89 | HR 81 | Ht 62.5 in | Wt 205.0 lb

## 2019-07-02 DIAGNOSIS — S82121D Displaced fracture of lateral condyle of right tibia, subsequent encounter for closed fracture with routine healing: Secondary | ICD-10-CM | POA: Diagnosis not present

## 2019-07-02 NOTE — Progress Notes (Signed)
Progress Note   Patient ID: Diana [name], female   DOB: 21-Oct-1934, 85 y.o.   MRN: [id number]   Chief Complaint  Patient presents with  . Knee Injury    01/18/19 right tibial plateau fracture / improving/ walking with walker today    Encounter Diagnosis  Name Primary?  . Closed fracture of lateral portion of right tibial plateau, with routine healing, subsequent encounter 01/18/19 Yes    85 year old female had a proximal tib-fib fracture near the metaphyseal diaphyseal junction.  She presented with persistent weakness and extensor lag had therapy has made some improvement in her gait although her extensor lag persists    ROS    BP (!) 202/89   Pulse 81   Ht 5' 2.5" (1.588 m)   Wt 205 lb (93 kg)   BMI 36.90 kg/m   Physical Exam Musculoskeletal:     Comments: Right knee no palpable defect in the patellar tendon extension up to 30 degrees and then she has a 30 degree lag.  Knee feels stable fracture site pain is resolved she is tender near the patellar tendon      Medical decisions:  (Established problem worse, x-ray ,physical therapy, over-the-counter medicines, read outside film or summarize x-ray)  Data  Imaging:   I went back and looked at all her x-rays there is no evidence of high or low riding patella  Encounter Diagnosis  Name Primary?  . Closed fracture of lateral portion of right tibial plateau, with routine healing, subsequent encounter 01/18/19 Yes    PLAN:   Continue home exercises, knee sleeve, walker    Arther Abbott, MD 07/02/2019 2:13 PM

## 2019-11-29 DIAGNOSIS — Z9181 History of falling: Secondary | ICD-10-CM | POA: Diagnosis not present

## 2019-11-29 DIAGNOSIS — S89091D Other physeal fracture of upper end of right tibia, subsequent encounter for fracture with routine healing: Secondary | ICD-10-CM | POA: Diagnosis not present

## 2019-12-26 DIAGNOSIS — R350 Frequency of micturition: Secondary | ICD-10-CM | POA: Diagnosis not present

## 2019-12-26 DIAGNOSIS — Z789 Other specified health status: Secondary | ICD-10-CM | POA: Diagnosis not present

## 2019-12-26 DIAGNOSIS — Z299 Encounter for prophylactic measures, unspecified: Secondary | ICD-10-CM | POA: Diagnosis not present

## 2019-12-26 DIAGNOSIS — N390 Urinary tract infection, site not specified: Secondary | ICD-10-CM | POA: Diagnosis not present

## 2019-12-26 DIAGNOSIS — M542 Cervicalgia: Secondary | ICD-10-CM | POA: Diagnosis not present

## 2019-12-26 DIAGNOSIS — I10 Essential (primary) hypertension: Secondary | ICD-10-CM | POA: Diagnosis not present

## 2019-12-30 DIAGNOSIS — S89091D Other physeal fracture of upper end of right tibia, subsequent encounter for fracture with routine healing: Secondary | ICD-10-CM | POA: Diagnosis not present

## 2019-12-30 DIAGNOSIS — Z9181 History of falling: Secondary | ICD-10-CM | POA: Diagnosis not present

## 2020-01-05 ENCOUNTER — Ambulatory Visit: Payer: Medicare Other

## 2020-01-07 DIAGNOSIS — I10 Essential (primary) hypertension: Secondary | ICD-10-CM | POA: Diagnosis not present

## 2020-01-07 DIAGNOSIS — I4811 Longstanding persistent atrial fibrillation: Secondary | ICD-10-CM | POA: Diagnosis not present

## 2020-01-08 DIAGNOSIS — Z9849 Cataract extraction status, unspecified eye: Secondary | ICD-10-CM | POA: Diagnosis not present

## 2020-01-08 DIAGNOSIS — H40023 Open angle with borderline findings, high risk, bilateral: Secondary | ICD-10-CM | POA: Diagnosis not present

## 2020-01-22 ENCOUNTER — Ambulatory Visit: Payer: Medicare Other | Attending: Internal Medicine

## 2020-01-27 DIAGNOSIS — S89091D Other physeal fracture of upper end of right tibia, subsequent encounter for fracture with routine healing: Secondary | ICD-10-CM | POA: Diagnosis not present

## 2020-01-27 DIAGNOSIS — Z9181 History of falling: Secondary | ICD-10-CM | POA: Diagnosis not present

## 2020-01-30 DIAGNOSIS — R350 Frequency of micturition: Secondary | ICD-10-CM | POA: Diagnosis not present

## 2020-01-30 DIAGNOSIS — N390 Urinary tract infection, site not specified: Secondary | ICD-10-CM | POA: Diagnosis not present

## 2020-01-30 DIAGNOSIS — Z789 Other specified health status: Secondary | ICD-10-CM | POA: Diagnosis not present

## 2020-01-30 DIAGNOSIS — I10 Essential (primary) hypertension: Secondary | ICD-10-CM | POA: Diagnosis not present

## 2020-01-30 DIAGNOSIS — Z299 Encounter for prophylactic measures, unspecified: Secondary | ICD-10-CM | POA: Diagnosis not present

## 2020-02-27 DIAGNOSIS — Z9181 History of falling: Secondary | ICD-10-CM | POA: Diagnosis not present

## 2020-02-27 DIAGNOSIS — S89091D Other physeal fracture of upper end of right tibia, subsequent encounter for fracture with routine healing: Secondary | ICD-10-CM | POA: Diagnosis not present

## 2020-03-04 DIAGNOSIS — R5383 Other fatigue: Secondary | ICD-10-CM | POA: Diagnosis not present

## 2020-03-04 DIAGNOSIS — E7800 Pure hypercholesterolemia, unspecified: Secondary | ICD-10-CM | POA: Diagnosis not present

## 2020-03-04 DIAGNOSIS — Z299 Encounter for prophylactic measures, unspecified: Secondary | ICD-10-CM | POA: Diagnosis not present

## 2020-03-04 DIAGNOSIS — I10 Essential (primary) hypertension: Secondary | ICD-10-CM | POA: Diagnosis not present

## 2020-03-04 DIAGNOSIS — Z7189 Other specified counseling: Secondary | ICD-10-CM | POA: Diagnosis not present

## 2020-03-04 DIAGNOSIS — Z79899 Other long term (current) drug therapy: Secondary | ICD-10-CM | POA: Diagnosis not present

## 2020-03-04 DIAGNOSIS — Z1211 Encounter for screening for malignant neoplasm of colon: Secondary | ICD-10-CM | POA: Diagnosis not present

## 2020-03-04 DIAGNOSIS — E559 Vitamin D deficiency, unspecified: Secondary | ICD-10-CM | POA: Diagnosis not present

## 2020-03-04 DIAGNOSIS — Z0000 Encounter for general adult medical examination without abnormal findings: Secondary | ICD-10-CM | POA: Diagnosis not present

## 2020-03-17 ENCOUNTER — Other Ambulatory Visit (HOSPITAL_COMMUNITY)
Admission: RE | Admit: 2020-03-17 | Discharge: 2020-03-17 | Disposition: A | Discharge status: Home / Self Care | Payer: Medicare Other | Source: Ambulatory Visit | Attending: Urology | Admitting: Urology

## 2020-03-17 ENCOUNTER — Ambulatory Visit: Payer: Medicare Other | Admitting: Urology

## 2020-03-17 ENCOUNTER — Other Ambulatory Visit: Payer: Self-pay | Admitting: Urology

## 2020-03-17 ENCOUNTER — Encounter: Payer: Self-pay | Admitting: Urology

## 2020-03-17 ENCOUNTER — Other Ambulatory Visit: Payer: Self-pay

## 2020-03-17 VITALS — BP 146/79 | HR 71 | Temp 98.2°F | Ht 64.5 in | Wt 187.0 lb

## 2020-03-17 DIAGNOSIS — N390 Urinary tract infection, site not specified: Secondary | ICD-10-CM

## 2020-03-17 DIAGNOSIS — R319 Hematuria, unspecified: Secondary | ICD-10-CM | POA: Diagnosis not present

## 2020-03-17 DIAGNOSIS — R310 Gross hematuria: Secondary | ICD-10-CM | POA: Diagnosis not present

## 2020-03-17 LAB — POCT URINALYSIS DIPSTICK
Glucose, UA: NEGATIVE
Ketones, UA: NEGATIVE
Nitrite, UA: POSITIVE
Protein, UA: POSITIVE — AB
Spec Grav, UA: >=1.03 — AB (ref 1.010–1.025)
Urobilinogen, UA: NEGATIVE E.U./dL — AB
pH, UA: 6 (ref 5.0–8.0)

## 2020-03-17 LAB — BASIC METABOLIC PANEL
BUN: 14 mg/dL (ref 7–25)
CO2: 29 mmol/L (ref 20–32)
Calcium: 10 mg/dL (ref 8.6–10.4)
Chloride: 106 mmol/L (ref 98–110)
Creat: 0.78 mg/dL (ref 0.60–0.88)
Glucose, Bld: 89 mg/dL (ref 65–139)
Potassium: 4.4 mmol/L (ref 3.5–5.3)
Sodium: 143 mmol/L (ref 135–146)

## 2020-03-17 LAB — BLADDER SCAN AMB NON-IMAGING: Scan Result: 40.2

## 2020-03-17 NOTE — Progress Notes (Signed)
03/17/2020 12:11 PM   Mikael Spray 1934/05/29 [id number]  Referring provider: Monico Blitz, MD [address] [place],  [place] [address]  Gross hematuria  HPI: Ms Wilton is an 86yo here for evaluation of gross hematuria. Over the past year she has had multiple episodes of gross painless hematuria. NO hx of nephrolithiasis. Prior tobacco abuse and quit 30 years ago. No dysuria. NO hx of UTI.    PMH: Past Medical History:  Diagnosis Date  . Arthritis   . Chronic deep vein thrombosis (DVT) of popliteal vein of left lower extremity (HCC)   . Chronic venous insufficiency   . DVT of lower extremity (deep venous thrombosis) ([place])   . Enlarged heart   . Glaucoma   . Hypertension   . Hyperthyroidism   . PE (pulmonary thromboembolism) ([place])     Surgical History: Past Surgical History:  Procedure Laterality Date  . BREAST LUMPECTOMY    . TUBAL LIGATION      Home Medications:  Allergies as of 03/17/2020   No Known Allergies     Medication List       Accurate as of March 17, 2020 12:11 PM. If you have any questions, ask your nurse or doctor.        amLODipine 5 MG tablet Commonly known as: NORVASC Take 5 mg by mouth every evening.   chlorthalidone 25 MG tablet Commonly known as: HYGROTON Take 25 mg by mouth daily.   cholecalciferol 25 MCG (1000 UNIT) tablet Commonly known as: VITAMIN D3 Take 1,000 Units by mouth daily.   ELDERBERRY PO Take by mouth.   latanoprost 0.005 % ophthalmic solution Commonly known as: XALATAN Place 1 drop into both eyes at bedtime.   lisinopril 20 MG tablet Commonly known as: ZESTRIL Take 20 mg by mouth every evening.   meclizine 12.5 MG tablet Commonly known as: ANTIVERT Take 12.5 mg by mouth 3 (three) times daily as needed for dizziness or nausea.   metoprolol succinate 25 MG 24 hr tablet Commonly known as: TOPROL-XL Take 1 tablet (25 mg total) by mouth every morning.   rivaroxaban 20 MG Tabs tablet Commonly known as:  XARELTO Take 20 mg by mouth daily.   vitamin C 100 MG tablet Take 100 mg by mouth daily.   zinc gluconate 50 MG tablet Take 50 mg by mouth daily.       Allergies: No Known Allergies  Family History: History reviewed. No pertinent family history.  Social History:  reports that she has quit smoking. She has never used smokeless tobacco. She reports that she does not drink alcohol or use drugs.  ROS: All other review of systems were reviewed and are negative except what is noted above in HPI  Physical Exam: BP (!) 146/79   Pulse 71   Temp 98.2 F (36.8 C)   Ht 5' 4.5" (1.638 m)   Wt 187 lb (84.8 kg)   BMI 31.60 kg/m   Constitutional:  Alert and oriented, No acute distress. HEENT: [place] AT, moist mucus membranes.  Trachea midline, no masses. Cardiovascular: No clubbing, cyanosis, or edema. Respiratory: Normal respiratory effort, no increased work of breathing. GI: Abdomen is soft, nontender, nondistended, no abdominal masses GU: No CVA tenderness.  Lymph: No cervical or inguinal lymphadenopathy. Skin: No rashes, bruises or suspicious lesions. Neurologic: Grossly intact, no focal deficits, moving all 4 extremities. Psychiatric: Normal mood and affect.  Laboratory Data: Lab Results  Component Value Date   WBC 12.7 (H) 01/23/2019   HGB 10.0 (  L) 01/23/2019   HCT 32.7 (L) 01/23/2019   MCV 89.6 01/23/2019   PLT 239 01/23/2019    Lab Results  Component Value Date   CREATININE 0.57 01/23/2019    No results found for: PSA  No results found for: TESTOSTERONE  No results found for: HGBA1C  Urinalysis    Component Value Date/Time   COLORURINE YELLOW 08/10/2017 1437   APPEARANCEUR HAZY (A) 08/10/2017 1437   LABSPEC 1.014 08/10/2017 1437   PHURINE 7.0 08/10/2017 1437   GLUCOSEU NEGATIVE 08/10/2017 1437   HGBUR SMALL (A) 08/10/2017 1437   BILIRUBINUR moderate 03/17/2020 1200   KETONESUR NEGATIVE 08/10/2017 1437   PROTEINUR Positive (A) 03/17/2020 1200   PROTEINUR  NEGATIVE 08/10/2017 1437   UROBILINOGEN negative (A) 03/17/2020 1200   NITRITE positive 03/17/2020 1200   NITRITE NEGATIVE 08/10/2017 1437   LEUKOCYTESUR Large (3+) (A) 03/17/2020 1200    Lab Results  Component Value Date   BACTERIA RARE (A) 08/10/2017    Pertinent Imaging:  No results found for this or any previous visit. No results found for this or any previous visit. No results found for this or any previous visit. No results found for this or any previous visit. No results found for this or any previous visit. No results found for this or any previous visit. No results found for this or any previous visit. No results found for this or any previous visit.  Assessment & Plan:    1. Urinary tract infection with hematuria, site unspecified -BMP -CT hematuria -office cystoscopy - POCT urinalysis dipstick - Bladder Scan (Post Void Residual) in office  2. Gross hematuria -hematuria workup   No follow-ups on file.  Wilkie Aye, MD  [facility] [place]

## 2020-03-17 NOTE — Patient Instructions (Signed)
Hematuria, Adult Hematuria is blood in the urine. Blood may be visible in the urine, or it may be identified with a test. This condition can be caused by infections of the bladder, urethra, kidney, or prostate. Other possible causes include:  Kidney stones.  Cancer of the urinary tract.  Too much calcium in the urine.  Conditions that are passed from parent to child (inherited conditions).  Exercise that requires a lot of energy. Infections can usually be treated with medicine, and a kidney stone usually will pass through your urine. If neither of these is the cause of your hematuria, more tests may be needed to identify the cause of your symptoms. It is very important to tell your health care provider about any blood in your urine, even if it is painless or the blood stops without treatment. Blood in the urine, when it happens and then stops and then happens again, can be a symptom of a very serious condition, including cancer. There is no pain in the initial stages of many urinary cancers. Follow these instructions at home: Medicines  Take over-the-counter and prescription medicines only as told by your health care provider.  If you were prescribed an antibiotic medicine, take it as told by your health care provider. Do not stop taking the antibiotic even if you start to feel better. Eating and drinking  Drink enough fluid to keep your urine clear or pale yellow. It is recommended that you drink 3-4 quarts (2.8-3.8 L) a day. If you have been diagnosed with an infection, it is recommended that you drink cranberry juice in addition to large amounts of water.  Avoid caffeine, tea, and carbonated beverages. These tend to irritate the bladder.  Avoid alcohol because it may irritate the prostate (men). General instructions  If you have been diagnosed with a kidney stone, follow your health care provider's instructions about straining your urine to catch the stone.  Empty your bladder  often. Avoid holding urine for long periods of time.  If you are female: ? After a bowel movement, wipe from front to back and use each piece of toilet paper only once. ? Empty your bladder before and after sex.  Pay attention to any changes in your symptoms. Tell your health care provider about any changes or any new symptoms.  It is your responsibility to get your test results. Ask your health care provider, or the department performing the test, when your results will be ready.  Keep all follow-up visits as told by your health care provider. This is important. Contact a health care provider if:  You develop back pain.  You have a fever.  You have nausea or vomiting.  Your symptoms do not improve after 3 days.  Your symptoms get worse. Get help right away if:  You develop severe vomiting and are unable take medicine without vomiting.  You develop severe pain in your back or abdomen even though you are taking medicine.  You pass a large amount of blood in your urine.  You pass blood clots in your urine.  You feel very weak or like you might faint.  You faint. Summary  Hematuria is blood in the urine. It has many possible causes.  It is very important that you tell your health care provider about any blood in your urine, even if it is painless or the blood stops without treatment.  Take over-the-counter and prescription medicines only as told by your health care provider.  Drink enough fluid to keep   your urine clear or pale yellow. This information is not intended to replace advice given to you by your health care provider. Make sure you discuss any questions you have with your health care provider. Document Revised: 04/04/2019 Document Reviewed: 12/11/2016 Elsevier Patient Education  2020 Elsevier Inc.  

## 2020-03-17 NOTE — Progress Notes (Signed)
Urological Symptom Review  Patient is experiencing the following symptoms: Hard to postpone urination Leakage of urine Urinary tract infection Vaginal bleeding (female only) Weak stream   Review of Systems  Gastrointestinal (upper)  : Negative for upper GI symptoms  Gastrointestinal (lower) : Constipation  Constitutional : Night Sweats  Skin: Negative for skin symptoms  Eyes: Blurred vision  Ear/Nose/Throat : Negative for Ear/Nose/Throat symptoms  Hematologic/Lymphatic: Swollen glands Easy bruising  Cardiovascular : Leg swelling Chest pain  Respiratory : Negative for respiratory symptoms  Endocrine: Negative for endocrine symptoms  Musculoskeletal: Joint pain  Neurological: Negative for neurological symptoms  Psychologic: Negative for psychiatric symptoms

## 2020-03-19 LAB — URINE CULTURE: Culture: >=100000 — AB

## 2020-03-24 ENCOUNTER — Telehealth: Payer: Self-pay

## 2020-03-24 ENCOUNTER — Other Ambulatory Visit: Payer: Self-pay

## 2020-03-24 DIAGNOSIS — N390 Urinary tract infection, site not specified: Secondary | ICD-10-CM

## 2020-03-24 DIAGNOSIS — R319 Hematuria, unspecified: Secondary | ICD-10-CM

## 2020-03-24 MED ORDER — NITROFURANTOIN MONOHYD MACRO 100 MG PO CAPS: 100.0000 mg | ORAL_CAPSULE | Freq: Two times a day (BID) | ORAL | 0 refills | Status: DC

## 2020-03-24 NOTE — Telephone Encounter (Signed)
-----   Message from Malen Gauze, MD sent at 03/21/2020 10:58 AM EDT ----- Correct, not bactrim. Macrobid 100mg  BID for 7 days ----- Message ----- From: , RN Sent: 03/19/2020  11:04 AM EDT To: 03/21/2020, MD  Urine culture

## 2020-03-24 NOTE — Progress Notes (Signed)
mcaro

## 2020-03-24 NOTE — Telephone Encounter (Signed)
Called no answer. No way to leave message. Prescription of antibiotic sent in.

## 2020-03-25 NOTE — Telephone Encounter (Signed)
Called pt again. Answered phone and hung up.

## 2020-03-28 DIAGNOSIS — Z9181 History of falling: Secondary | ICD-10-CM | POA: Diagnosis not present

## 2020-03-28 DIAGNOSIS — S89091D Other physeal fracture of upper end of right tibia, subsequent encounter for fracture with routine healing: Secondary | ICD-10-CM | POA: Diagnosis not present

## 2020-04-17 ENCOUNTER — Ambulatory Visit (HOSPITAL_COMMUNITY): Payer: Medicare Other

## 2020-04-23 ENCOUNTER — Other Ambulatory Visit: Payer: Self-pay

## 2020-04-23 ENCOUNTER — Encounter: Payer: Self-pay | Admitting: Urology

## 2020-04-23 ENCOUNTER — Ambulatory Visit (INDEPENDENT_AMBULATORY_CARE_PROVIDER_SITE_OTHER): Payer: Medicare Other | Admitting: Urology

## 2020-04-23 VITALS — BP 197/74 | HR 72 | Temp 98.4°F | Ht 64.0 in | Wt 180.0 lb

## 2020-04-23 DIAGNOSIS — R310 Gross hematuria: Secondary | ICD-10-CM

## 2020-04-23 LAB — POCT URINALYSIS DIPSTICK
Bilirubin, UA: NEGATIVE
Glucose, UA: NEGATIVE
Ketones, UA: NEGATIVE
Nitrite, UA: POSITIVE
Protein, UA: POSITIVE — AB
Spec Grav, UA: 1.02 (ref 1.010–1.025)
Urobilinogen, UA: NEGATIVE E.U./dL — AB
pH, UA: 7.5 (ref 5.0–8.0)

## 2020-04-23 MED ORDER — CIPROFLOXACIN HCL 500 MG PO TABS: 500.0000 mg | ORAL_TABLET | Freq: Once | ORAL | Status: AC

## 2020-04-23 NOTE — Progress Notes (Signed)
° °  04/23/20  CC:  Gross hematuria and UTI  HPI: [name] is a 86yo here for followup for gross hematuria. She did not get her CT scan  Blood pressure (!) 197/74, pulse 72, temperature 98.4 F (36.9 C), height 5\' 4"  (1.626 m), weight 180 lb (81.6 kg). NED. A&Ox3.   No respiratory distress   Abd soft, NT, ND Normal external genitalia with patent urethral meatus  Cystoscopy Procedure Note  Patient identification was confirmed, informed consent was obtained, and patient was prepped using Betadine solution.  Lidocaine jelly was administered per urethral meatus.    Procedure: - Flexible cystoscope introduced, without any difficulty.   - Thorough search of the bladder revealed:    normal urethral meatus    normal urothelium    no stones    no ulcers     no tumors    no urethral polyps    no trabeculation  - Ureteral orifices were normal in position and appearance.  Post-Procedure: - Patient tolerated the procedure well  Assessment/ Plan: -CT hematuria, will call with results   No follow-ups on file.  , MD

## 2020-04-23 NOTE — Progress Notes (Signed)
Urological Symptom Review  Patient is experiencing the following symptoms: Urinary tract infection Vaginal bleeding (female only)   Review of Systems  Gastrointestinal (upper)  : Negative for upper GI symptoms  Gastrointestinal (lower) : Negative for lower GI symptoms  Constitutional : Negative for symptoms  Skin: Negative for skin symptoms  Eyes: Blurred vision  Ear/Nose/Throat : Negative for Ear/Nose/Throat symptoms  Hematologic/Lymphatic: Negative for Hematologic/Lymphatic symptoms  Cardiovascular : Leg swelling  Respiratory : Shortness of breath  Endocrine: Negative for endocrine symptoms  Musculoskeletal: Back pain Joint pain  Neurological: Negative for neurological symptoms  Psychologic: Negative for psychiatric symptoms

## 2020-04-24 ENCOUNTER — Encounter: Payer: Self-pay | Admitting: Urology

## 2020-04-24 ENCOUNTER — Telehealth: Payer: Self-pay | Admitting: Urology

## 2020-04-24 NOTE — Telephone Encounter (Signed)
Pt requests a nurse call her. She was here yesterday and is confused about a medication issue.

## 2020-04-24 NOTE — Telephone Encounter (Signed)
Called pt. Answered phone and then  hung up

## 2020-04-25 ENCOUNTER — Telehealth: Payer: Self-pay

## 2020-04-25 NOTE — Telephone Encounter (Signed)
I talked with Dr. Ronne Binning and he didn't send any meds because Diana [name] was asymptomatic. I called Diana [name]. And explained what he did was ordered a CT for her. Explained if she did develop any symptoms to give [place] a call. Diana [name]. Expressed understanding.

## 2020-04-25 NOTE — Telephone Encounter (Signed)
Spoke with pt. No meds were at pharmacy. None in chart under meds and nothing in notes. Sent task to Dr. Ronne Binning to check on this.  Called pt. Back to let her know waiting to hear from Dr. Ronne Binning.

## 2020-04-28 DIAGNOSIS — Z9181 History of falling: Secondary | ICD-10-CM | POA: Diagnosis not present

## 2020-04-28 DIAGNOSIS — S89091D Other physeal fracture of upper end of right tibia, subsequent encounter for fracture with routine healing: Secondary | ICD-10-CM | POA: Diagnosis not present

## 2020-05-05 IMAGING — DX DG KNEE COMPLETE 4+V*R*
4 series · 4 of 4 positions shown · non-contrast
Comparison: 07/18/2018

CLINICAL DATA: Fall, right knee pain

EXAM:
RIGHT KNEE - COMPLETE 4+ VIEW

[knee ap (1 of 3)]
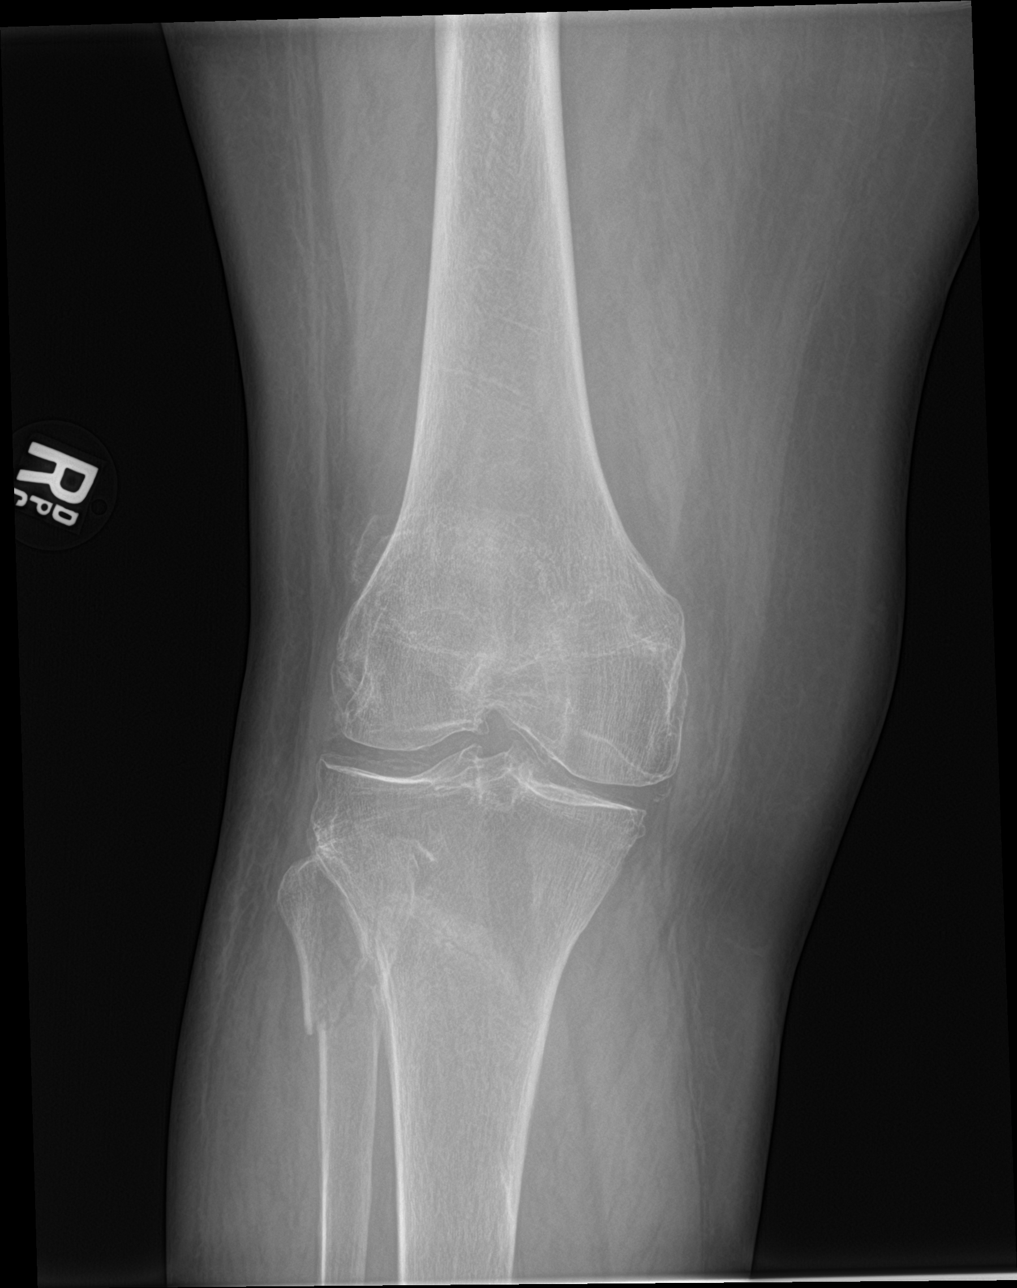

[knee ap (2 of 3)]
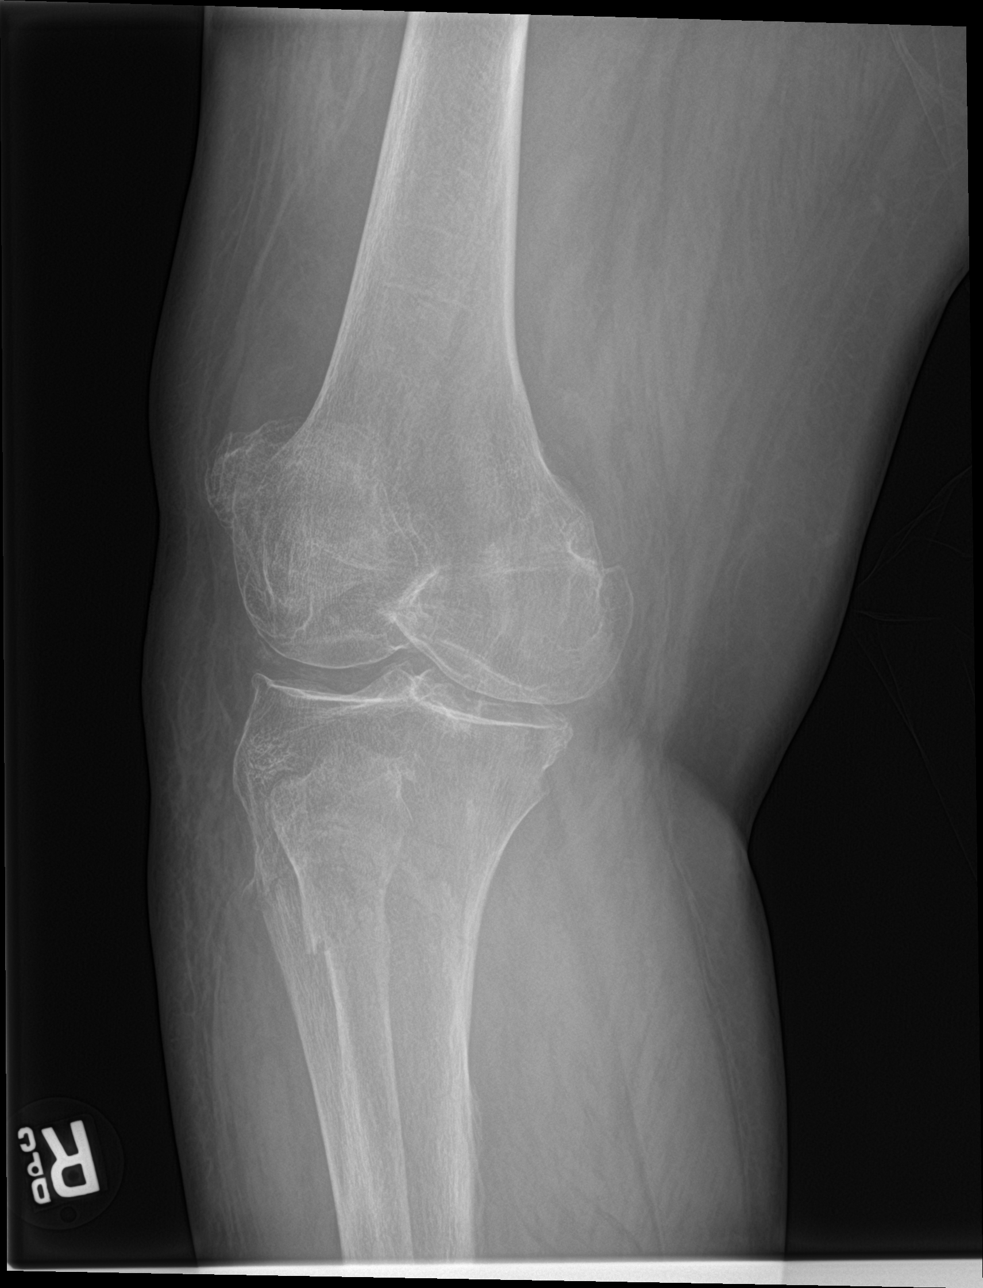

[knee ap (3 of 3)]
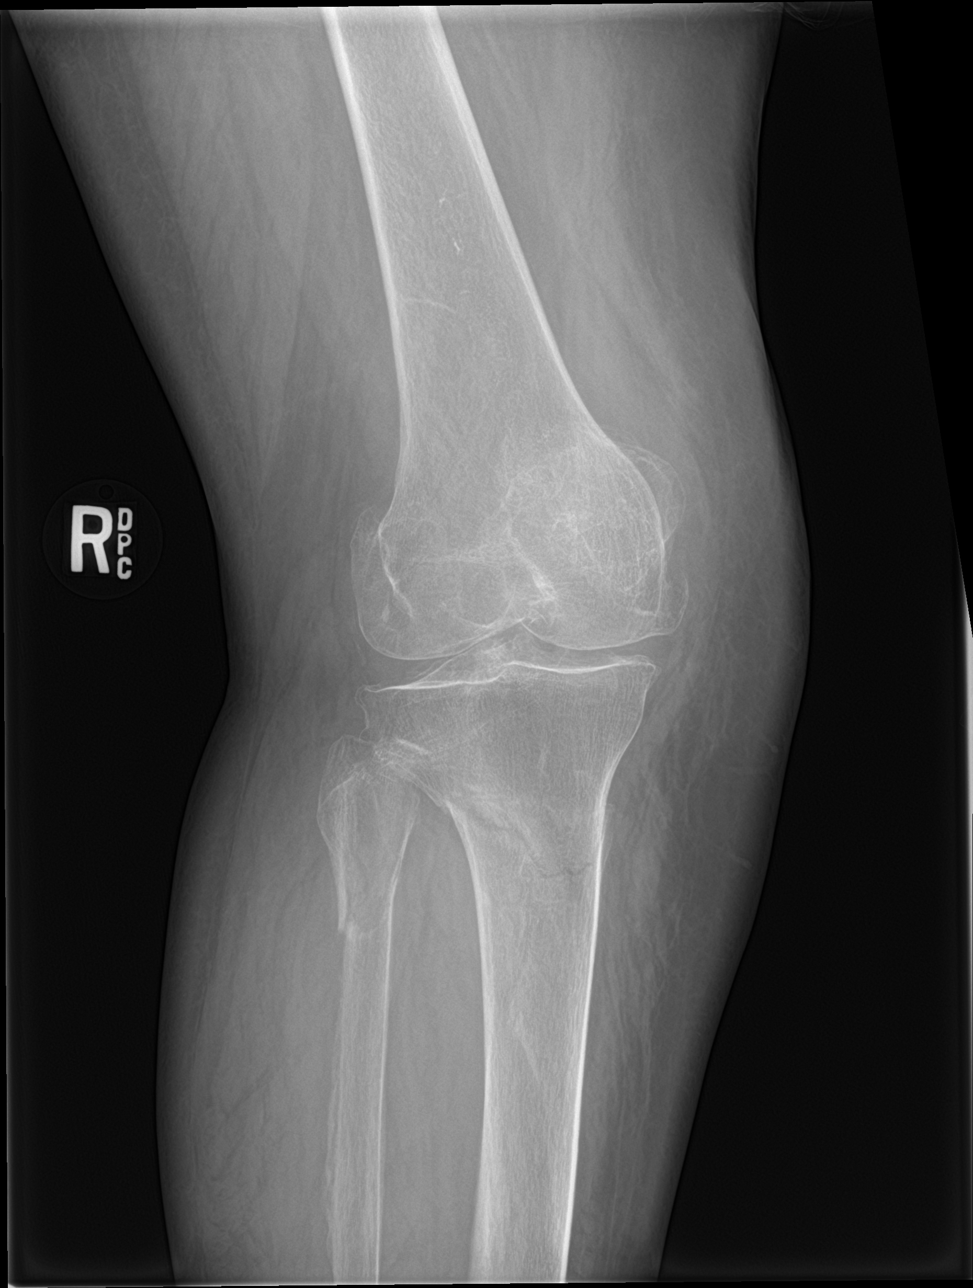

[knee lat]
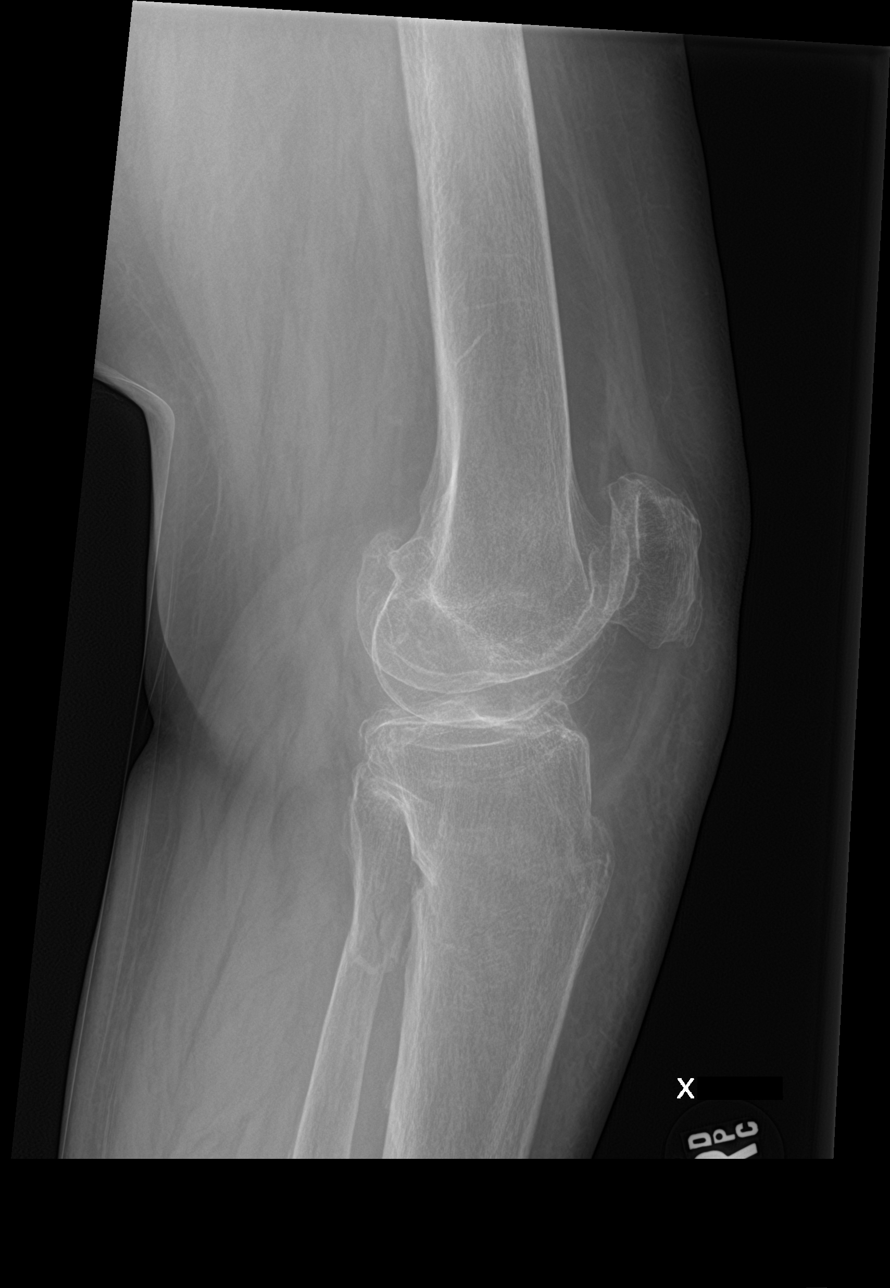

[4 of 4 positions shown; findings below may reference images not displayed]

FINDINGS: Fracture through the right proximal tibial metaphysis, nondisplaced.
A vertical component may extend into the lateral tibial plateau.
Fracture noted through the fibular neck and proximal fibular shaft.
No joint effusion. Moderate tricompartment degenerative changes
within the right knee.
IMPRESSION: Proximal right tibial metaphyseal fracture with possible extension
into the lateral plateau. Fibular neck and proximal fibular shaft
fracture.

## 2020-05-28 DIAGNOSIS — Z9181 History of falling: Secondary | ICD-10-CM | POA: Diagnosis not present

## 2020-05-28 DIAGNOSIS — S89091D Other physeal fracture of upper end of right tibia, subsequent encounter for fracture with routine healing: Secondary | ICD-10-CM | POA: Diagnosis not present

## 2020-06-02 ENCOUNTER — Ambulatory Visit (HOSPITAL_COMMUNITY)
Admission: RE | Admit: 2020-06-02 | Discharge: 2020-06-02 | Disposition: A | Discharge status: Home / Self Care | Payer: Medicare Other | Source: Ambulatory Visit | Attending: Urology | Admitting: Urology

## 2020-06-02 ENCOUNTER — Other Ambulatory Visit: Payer: Self-pay

## 2020-06-02 DIAGNOSIS — R310 Gross hematuria: Secondary | ICD-10-CM

## 2020-06-02 DIAGNOSIS — K5730 Diverticulosis of large intestine without perforation or abscess without bleeding: Secondary | ICD-10-CM | POA: Diagnosis not present

## 2020-06-02 DIAGNOSIS — I700 Atherosclerosis of aorta: Secondary | ICD-10-CM | POA: Diagnosis not present

## 2020-06-02 LAB — POCT I-STAT CREATININE: Creatinine, Ser: 1 mg/dL (ref 0.44–1.00)

## 2020-06-02 MED ORDER — IOHEXOL 300 MG/ML  SOLN
150.0000 mL | Freq: Once | INTRAMUSCULAR | Status: AC | PRN
  Administered 2020-06-02: 125 mL via INTRAVENOUS

## 2020-06-04 ENCOUNTER — Other Ambulatory Visit: Payer: Self-pay

## 2020-06-04 ENCOUNTER — Ambulatory Visit (INDEPENDENT_AMBULATORY_CARE_PROVIDER_SITE_OTHER): Payer: Medicare Other | Admitting: Urology

## 2020-06-04 ENCOUNTER — Encounter: Payer: Self-pay | Admitting: Urology

## 2020-06-04 VITALS — BP 144/77 | HR 61 | Temp 98.2°F | Ht 63.5 in | Wt 191.0 lb

## 2020-06-04 DIAGNOSIS — R310 Gross hematuria: Secondary | ICD-10-CM

## 2020-06-04 MED ORDER — PREMARIN 0.625 MG/GM VA CREA
1.0000 | TOPICAL_CREAM | VAGINAL | 12 refills | Status: DC
Start: 2020-06-04 — End: 2023-11-23

## 2020-06-04 NOTE — Progress Notes (Signed)
Urological Symptom Review  Patient is experiencing the following symptoms: Vaginal bleeding (female only)   Review of Systems  Gastrointestinal (upper)  : Negative for upper GI symptoms  Gastrointestinal (lower) : Negative for lower GI symptoms  Constitutional : Negative for symptoms  Skin: Negative for skin symptoms  Eyes: Negative for eye symptoms  Ear/Nose/Throat : Sinus problems  Hematologic/Lymphatic: Negative for Hematologic/Lymphatic symptoms  Cardiovascular : Negative for cardiovascular symptoms  Respiratory : Shortness of breath  Endocrine: Negative for endocrine symptoms  Musculoskeletal: Back pain  Neurological: Negative for neurological symptoms  Psychologic: Negative for psychiatric symptoms

## 2020-06-04 NOTE — Patient Instructions (Signed)
Hematuria, Adult Hematuria is blood in the urine. Blood may be visible in the urine, or it may be identified with a test. This condition can be caused by infections of the bladder, urethra, kidney, or prostate. Other possible causes include:  Kidney stones.  Cancer of the urinary tract.  Too much calcium in the urine.  Conditions that are passed from parent to child (inherited conditions).  Exercise that requires a lot of energy. Infections can usually be treated with medicine, and a kidney stone usually will pass through your urine. If neither of these is the cause of your hematuria, more tests may be needed to identify the cause of your symptoms. It is very important to tell your health care provider about any blood in your urine, even if it is painless or the blood stops without treatment. Blood in the urine, when it happens and then stops and then happens again, can be a symptom of a very serious condition, including cancer. There is no pain in the initial stages of many urinary cancers. Follow these instructions at home: Medicines  Take over-the-counter and prescription medicines only as told by your health care provider.  If you were prescribed an antibiotic medicine, take it as told by your health care provider. Do not stop taking the antibiotic even if you start to feel better. Eating and drinking  Drink enough fluid to keep your urine clear or pale yellow. It is recommended that you drink 3-4 quarts (2.8-3.8 L) a day. If you have been diagnosed with an infection, it is recommended that you drink cranberry juice in addition to large amounts of water.  Avoid caffeine, tea, and carbonated beverages. These tend to irritate the bladder.  Avoid alcohol because it may irritate the prostate (men). General instructions  If you have been diagnosed with a kidney stone, follow your health care provider's instructions about straining your urine to catch the stone.  Empty your bladder  often. Avoid holding urine for long periods of time.  If you are female: ? After a bowel movement, wipe from front to back and use each piece of toilet paper only once. ? Empty your bladder before and after sex.  Pay attention to any changes in your symptoms. Tell your health care provider about any changes or any new symptoms.  It is your responsibility to get your test results. Ask your health care provider, or the department performing the test, when your results will be ready.  Keep all follow-up visits as told by your health care provider. This is important. Contact a health care provider if:  You develop back pain.  You have a fever.  You have nausea or vomiting.  Your symptoms do not improve after 3 days.  Your symptoms get worse. Get help right away if:  You develop severe vomiting and are unable take medicine without vomiting.  You develop severe pain in your back or abdomen even though you are taking medicine.  You pass a large amount of blood in your urine.  You pass blood clots in your urine.  You feel very weak or like you might faint.  You faint. Summary  Hematuria is blood in the urine. It has many possible causes.  It is very important that you tell your health care provider about any blood in your urine, even if it is painless or the blood stops without treatment.  Take over-the-counter and prescription medicines only as told by your health care provider.  Drink enough fluid to keep   your urine clear or pale yellow. This information is not intended to replace advice given to you by your health care provider. Make sure you discuss any questions you have with your health care provider. Document Revised: 04/04/2019 Document Reviewed: 12/11/2016 Elsevier Patient Education  2020 Elsevier Inc.  

## 2020-06-04 NOTE — Progress Notes (Signed)
06/04/2020 3:08 PM   Diana [name] 1933-12-20 [id number]  Referring provider: Kirstie Peri, MD [address] [place],  [place] [address]  Gross hematuria  HPI: Diana [name] is a [phone] with a hx fo gross hematuria here for followup. CT hematuria showed no evidence of urinary tract calculi and no obvious GU tumors.   PMH: Past Medical History:  Diagnosis Date  . Arthritis   . Chronic deep vein thrombosis (DVT) of popliteal vein of left lower extremity (HCC)   . Chronic venous insufficiency   . DVT of lower extremity (deep venous thrombosis) (HCC)   . Enlarged heart   . Glaucoma   . Hypertension   . Hyperthyroidism   . PE (pulmonary thromboembolism) (HCC)     Surgical History: Past Surgical History:  Procedure Laterality Date  . BREAST LUMPECTOMY    . TUBAL LIGATION      Home Medications:  Allergies as of 06/04/2020   No Known Allergies     Medication List       Accurate as of June 04, 2020  3:08 PM. If you have any questions, ask your nurse or doctor.        amLODipine 5 MG tablet Commonly known as: NORVASC Take 5 mg by mouth every evening.   chlorthalidone 25 MG tablet Commonly known as: HYGROTON Take 25 mg by mouth daily.   cholecalciferol 25 MCG (1000 UNIT) tablet Commonly known as: VITAMIN D3 Take 1,000 Units by mouth daily.   ELDERBERRY PO Take by mouth.   furosemide 20 MG tablet Commonly known as: LASIX Take 20 mg by mouth daily as needed.   latanoprost 0.005 % ophthalmic solution Commonly known as: XALATAN Place 1 drop into both eyes at bedtime.   lisinopril 20 MG tablet Commonly known as: ZESTRIL Take 20 mg by mouth every evening.   meclizine 12.5 MG tablet Commonly known as: ANTIVERT Take 12.5 mg by mouth 3 (three) times daily as needed for dizziness or nausea.   metoprolol succinate 25 MG 24 hr tablet Commonly known as: TOPROL-XL Take 1 tablet (25 mg total) by mouth every morning.   metoprolol succinate 25 MG 24 hr  tablet Commonly known as: TOPROL-XL Take by mouth.   nitrofurantoin (macrocrystal-monohydrate) 100 MG capsule Commonly known as: MACROBID Take 1 capsule (100 mg total) by mouth 2 (two) times daily.   rivaroxaban 20 MG Tabs tablet Commonly known as: XARELTO Take 20 mg by mouth daily.   Xarelto 20 MG Tabs tablet Generic drug: rivaroxaban Take by mouth.   vitamin C 100 MG tablet Take 100 mg by mouth daily.   zinc gluconate 50 MG tablet Take 50 mg by mouth daily.       Allergies: No Known Allergies  Family History: No family history on file.  Social History:  reports that she has quit smoking. She has never used smokeless tobacco. She reports that she does not drink alcohol and does not use drugs.  ROS: All other review of systems were reviewed and are negative except what is noted above in HPI  Physical Exam: BP (!) 144/77   Pulse 61   Temp 98.2 F (36.8 C)   Ht 5' 3.5" (1.613 m)   Wt 191 lb (86.6 kg)   BMI 33.30 kg/m   Constitutional:  Alert and oriented, No acute distress. HEENT: [place] AT, moist mucus membranes.  Trachea midline, no masses. Cardiovascular: No clubbing, cyanosis, or edema. Respiratory: Normal respiratory effort, no increased work of breathing. GI: Abdomen is soft,  nontender, nondistended, no abdominal masses GU: No CVA tenderness.  Lymph: No cervical or inguinal lymphadenopathy. Skin: No rashes, bruises or suspicious lesions. Neurologic: Grossly intact, no focal deficits, moving all 4 extremities. Psychiatric: Normal mood and affect.  Laboratory Data: Lab Results  Component Value Date   WBC 12.7 (H) 01/23/2019   HGB 10.0 (L) 01/23/2019   HCT 32.7 (L) 01/23/2019   MCV 89.6 01/23/2019   PLT 239 01/23/2019    Lab Results  Component Value Date   CREATININE 1.00 06/02/2020    No results found for: PSA  No results found for: TESTOSTERONE  No results found for: HGBA1C  Urinalysis    Component Value Date/Time   COLORURINE YELLOW  08/10/2017 1437   APPEARANCEUR HAZY (A) 08/10/2017 1437   LABSPEC 1.014 08/10/2017 1437   PHURINE 7.0 08/10/2017 1437   GLUCOSEU NEGATIVE 08/10/2017 1437   HGBUR SMALL (A) 08/10/2017 1437   BILIRUBINUR neg 04/23/2020 1608   KETONESUR NEGATIVE 08/10/2017 1437   PROTEINUR Positive (A) 04/23/2020 1608   PROTEINUR NEGATIVE 08/10/2017 1437   UROBILINOGEN negative (A) 04/23/2020 1608   NITRITE positive 04/23/2020 1608   NITRITE NEGATIVE 08/10/2017 1437   LEUKOCYTESUR Moderate (2+) (A) 04/23/2020 1608    Lab Results  Component Value Date   BACTERIA RARE (A) 08/10/2017    Pertinent Imaging: Ct hematuria 06/02/2020: Images reviewed and discussed with the patient No results found for this or any previous visit.  No results found for this or any previous visit.  No results found for this or any previous visit.  No results found for this or any previous visit.  No results found for this or any previous visit.  No results found for this or any previous visit.  Results for orders placed during the hospital encounter of 06/02/20  CT HEMATURIA WORKUP  Narrative CLINICAL DATA:  Painless gross hematuria.  EXAM: CT ABDOMEN AND PELVIS WITHOUT AND WITH CONTRAST  TECHNIQUE: Multidetector CT imaging of the abdomen and pelvis was performed following the standard protocol before and following the bolus administration of intravenous contrast.  CONTRAST:  OMNIPAQUE IOHEXOL 300 MG/ML  SOLN  COMPARISON:  None.  FINDINGS: Lower Chest: No acute findings.  Hepatobiliary: No hepatic masses identified. Multiple cysts are seen in both right and left lobes. Gallstones are seen, however there is no evidence of cholecystitis or biliary dilatation.  Pancreas:  No mass or inflammatory changes.  Spleen: Within normal limits in size and appearance.  Adrenals/Urinary Tract: No adrenal masses identified. No evidence of urolithiasis or hydronephrosis. A few tiny sub-cm low  attention lesions in both kidneys are too small to characterize, but most likely represent tiny cysts. No complex cystic or solid renal masses identified. No masses seen involving the ureters or bladder.  Stomach/Bowel: No evidence of obstruction, inflammatory process or abnormal fluid collections. Diverticulosis is seen mainly involving the descending and sigmoid colon, however there is no evidence of diverticulitis.  Vascular/Lymphatic: No pathologically enlarged lymph nodes. No abdominal aortic aneurysm. Aortic atherosclerosis noted.  Reproductive:  No mass or other significant abnormality.  Other:  None.  Musculoskeletal:  No suspicious bone lesions identified.  IMPRESSION: No radiographic evidence of urinary tract neoplasm, urolithiasis, or hydronephrosis.  Cholelithiasis. No radiographic evidence of cholecystitis.  Colonic diverticulosis, without radiographic evidence of diverticulitis.  Aortic Atherosclerosis (ICD10-I70.0).   Electronically Signed By: Danae Orleans M.D. On: 06/02/2020 16:08  No results found for this or any previous visit.   Assessment & Plan:   1. Gross hematuria: -  RTC 3 months with UA -we will trial topical estrogen for urethral caruncle and recurrent UTI   No follow-ups on file.  Wilkie Aye, MD  [facility] [place]

## 2020-06-13 DIAGNOSIS — I10 Essential (primary) hypertension: Secondary | ICD-10-CM | POA: Diagnosis not present

## 2020-06-13 DIAGNOSIS — I4891 Unspecified atrial fibrillation: Secondary | ICD-10-CM | POA: Diagnosis not present

## 2020-06-13 DIAGNOSIS — Z299 Encounter for prophylactic measures, unspecified: Secondary | ICD-10-CM | POA: Diagnosis not present

## 2020-06-13 DIAGNOSIS — R350 Frequency of micturition: Secondary | ICD-10-CM | POA: Diagnosis not present

## 2020-06-13 DIAGNOSIS — N390 Urinary tract infection, site not specified: Secondary | ICD-10-CM | POA: Diagnosis not present

## 2020-06-13 DIAGNOSIS — I82409 Acute embolism and thrombosis of unspecified deep veins of unspecified lower extremity: Secondary | ICD-10-CM | POA: Diagnosis not present

## 2020-06-26 DIAGNOSIS — N390 Urinary tract infection, site not specified: Secondary | ICD-10-CM | POA: Diagnosis not present

## 2020-06-26 DIAGNOSIS — I10 Essential (primary) hypertension: Secondary | ICD-10-CM | POA: Diagnosis not present

## 2020-06-26 DIAGNOSIS — Z299 Encounter for prophylactic measures, unspecified: Secondary | ICD-10-CM | POA: Diagnosis not present

## 2020-06-26 DIAGNOSIS — R350 Frequency of micturition: Secondary | ICD-10-CM | POA: Diagnosis not present

## 2020-06-26 DIAGNOSIS — I4891 Unspecified atrial fibrillation: Secondary | ICD-10-CM | POA: Diagnosis not present

## 2020-06-28 DIAGNOSIS — S89091D Other physeal fracture of upper end of right tibia, subsequent encounter for fracture with routine healing: Secondary | ICD-10-CM | POA: Diagnosis not present

## 2020-06-28 DIAGNOSIS — Z9181 History of falling: Secondary | ICD-10-CM | POA: Diagnosis not present

## 2020-07-07 DIAGNOSIS — I480 Paroxysmal atrial fibrillation: Secondary | ICD-10-CM | POA: Diagnosis not present

## 2020-07-07 DIAGNOSIS — I10 Essential (primary) hypertension: Secondary | ICD-10-CM | POA: Diagnosis not present

## 2020-07-29 DIAGNOSIS — Z9181 History of falling: Secondary | ICD-10-CM | POA: Diagnosis not present

## 2020-07-29 DIAGNOSIS — S89091D Other physeal fracture of upper end of right tibia, subsequent encounter for fracture with routine healing: Secondary | ICD-10-CM | POA: Diagnosis not present

## 2020-08-26 DIAGNOSIS — N390 Urinary tract infection, site not specified: Secondary | ICD-10-CM | POA: Diagnosis not present

## 2020-08-26 DIAGNOSIS — Z299 Encounter for prophylactic measures, unspecified: Secondary | ICD-10-CM | POA: Diagnosis not present

## 2020-08-26 DIAGNOSIS — R350 Frequency of micturition: Secondary | ICD-10-CM | POA: Diagnosis not present

## 2020-08-26 DIAGNOSIS — I10 Essential (primary) hypertension: Secondary | ICD-10-CM | POA: Diagnosis not present

## 2020-08-26 DIAGNOSIS — I82409 Acute embolism and thrombosis of unspecified deep veins of unspecified lower extremity: Secondary | ICD-10-CM | POA: Diagnosis not present

## 2020-08-28 DIAGNOSIS — S89091D Other physeal fracture of upper end of right tibia, subsequent encounter for fracture with routine healing: Secondary | ICD-10-CM | POA: Diagnosis not present

## 2020-08-28 DIAGNOSIS — Z9181 History of falling: Secondary | ICD-10-CM | POA: Diagnosis not present

## 2020-09-03 ENCOUNTER — Ambulatory Visit: Payer: Medicare Other | Admitting: Urology

## 2020-09-03 DIAGNOSIS — N390 Urinary tract infection, site not specified: Secondary | ICD-10-CM

## 2020-10-13 ENCOUNTER — Encounter: Payer: Self-pay | Admitting: Urology

## 2020-10-13 ENCOUNTER — Other Ambulatory Visit: Payer: Self-pay

## 2020-10-13 ENCOUNTER — Ambulatory Visit (INDEPENDENT_AMBULATORY_CARE_PROVIDER_SITE_OTHER): Payer: Medicare Other | Admitting: Urology

## 2020-10-13 VITALS — BP 165/84 | HR 64 | Temp 98.4°F | Ht 64.5 in | Wt 195.0 lb

## 2020-10-13 DIAGNOSIS — R310 Gross hematuria: Secondary | ICD-10-CM | POA: Diagnosis not present

## 2020-10-13 NOTE — Patient Instructions (Signed)
Conjugated Estrogens vaginal cream °What is this medicine? °CONJUGATED ESTROGENS (CON ju gate ed ESS troe jenz) are a mixture of female hormones. This cream can help relieve symptoms associated with menopause.like vaginal dryness and irritation. °This medicine may be used for other purposes; ask your health care provider or pharmacist if you have questions. °COMMON BRAND NAME(S): Premarin °What should I tell my health care provider before I take this medicine? °They need to know if you have any of these conditions: °· abnormal vaginal bleeding °· blood vessel disease or blood clots °· breast, cervical, endometrial, or uterine cancer °· dementia °· diabetes °· gallbladder disease °· heart disease or recent heart attack °· high blood pressure °· high cholesterol °· high level of calcium in the blood °· hysterectomy °· kidney disease °· liver disease °· migraine headaches °· protein C deficiency °· protein S deficiency °· stroke °· systemic lupus erythematosus (SLE) °· tobacco smoker °· an unusual or allergic reaction to estrogens other medicines, foods, dyes, or preservatives °· pregnant or trying to get pregnant °· breast-feeding °How should I use this medicine? °This medicine is for use in the vagina only. Do not take by mouth. Follow the directions on the prescription label. Use at bedtime unless otherwise directed by your doctor or health care professional. Use the special applicator supplied with the cream. Wash hands before and after use. Fill the applicator with the cream and remove from the tube. Lie on your back, part and bend your knees. Insert the applicator into the vagina and push the plunger to expel the cream into the vagina. Wash the applicator with warm soapy water and rinse well. Use exactly as directed for the complete length of time prescribed. Do not stop using except on the advice of your doctor or health care professional. °Talk to your pediatrician regarding the use of this medicine in  children. Special care may be needed. °A patient package insert for the product will be given with each prescription and refill. Read this sheet carefully each time. The sheet may change frequently. °Overdosage: If you think you have taken too much of this medicine contact a poison control center or emergency room at once. °NOTE: This medicine is only for you. Do not share this medicine with others. °What if I miss a dose? °If you miss a dose, use it as soon as you can. If it is almost time for your next dose, use only that dose. Do not use double or extra doses. °What may interact with this medicine? °Do not take this medicine with any of the following medications: °· aromatase inhibitors like aminoglutethimide, anastrozole, exemestane, letrozole, testolactone °This medicine may also interact with the following medications: °· barbiturates used for inducing sleep or treating seizures °· carbamazepine °· grapefruit juice °· medicines for fungal infections like itraconazole and ketoconazole °· raloxifene or tamoxifen °· rifabutin °· rifampin °· rifapentine °· ritonavir °· some antibiotics used to treat infections °· St. John's Wort °· warfarin °This list may not describe all possible interactions. Give your health care provider a list of all the medicines, herbs, non-prescription drugs, or dietary supplements you use. Also tell them if you smoke, drink alcohol, or use illegal drugs. Some items may interact with your medicine. °What should I watch for while using this medicine? °Visit your health care professional for regular checks on your progress. You will need a regular breast and pelvic exam. You should also discuss the need for regular mammograms with your health care professional, and   follow his or her guidelines. °This medicine can make your body retain fluid, making your fingers, hands, or ankles swell. Your blood pressure can go up. Contact your doctor or health care professional if you feel you are  retaining fluid. °If you have any reason to think you are pregnant; stop taking this medicine at once and contact your doctor or health care professional. °Tobacco smoking increases the risk of getting a blood clot or having a stroke, especially if you are more than 84 years old. You are strongly advised not to smoke. °If you wear contact lenses and notice visual changes, or if the lenses begin to feel uncomfortable, consult your eye care specialist. °If you are going to have elective surgery, you may need to stop taking this medicine beforehand. Consult your health care professional for advice prior to scheduling the surgery. °What side effects may I notice from receiving this medicine? °Side effects that you should report to your doctor or health care professional as soon as possible: °· allergic reactions like skin rash, itching or hives, swelling of the face, lips, or tongue °· breast tissue changes or discharge °· changes in vision °· chest pain °· confusion, trouble speaking or understanding °· dark urine °· general ill feeling or flu-like symptoms °· light-colored stools °· nausea, vomiting °· pain, swelling, warmth in the leg °· right upper belly pain °· severe headaches °· shortness of breath °· sudden numbness or weakness of the face, arm or leg °· trouble walking, dizziness, loss of balance or coordination °· unusual vaginal bleeding °· yellowing of the eyes or skin °Side effects that usually do not require medical attention (report to your doctor or health care professional if they continue or are bothersome): °· hair loss °· increased hunger or thirst °· increased urination °· symptoms of vaginal infection like itching, irritation or unusual discharge °· unusually weak or tired °This list may not describe all possible side effects. Call your doctor for medical advice about side effects. You may report side effects to FDA at 1-800-FDA-1088. °Where should I keep my medicine? °Keep out of the reach of  children. °Store at room temperature between 15 and 30 degrees C (59 and 86 degrees F). Throw away any unused medicine after the expiration date. °NOTE: This sheet is a summary. It may not cover all possible information. If you have questions about this medicine, talk to your doctor, pharmacist, or health care provider. °© 2020 [employer] (2011-02-10 09:20:36) ° °

## 2020-10-13 NOTE — Progress Notes (Signed)
10/13/2020 4:12 PM   Chrystie Nose [birth year] [id number]  Referring provider: Kirstie Peri, MD [address] [place],  [place] [address]  Gross hematuria  HPI: Mr Diana [name] is a 86yo here for followup fro gross hematuria and urethral caruncle. She was given premarin last visit and has not used the topical estrogen. She has blood with wiping after urination. No dysuria. No other LUTS   PMH: Past Medical History:  Diagnosis Date  . Arthritis   . Chronic deep vein thrombosis (DVT) of popliteal vein of left lower extremity (HCC)   . Chronic venous insufficiency   . DVT of lower extremity (deep venous thrombosis) (HCC)   . Enlarged heart   . Glaucoma   . Hypertension   . Hyperthyroidism   . PE (pulmonary thromboembolism) (HCC)     Surgical History: Past Surgical History:  Procedure Laterality Date  . BREAST LUMPECTOMY    . TUBAL LIGATION      Home Medications:  Allergies as of 10/13/2020   No Known Allergies     Medication List       Accurate as of October 13, 2020  4:12 PM. If you have any questions, ask your nurse or doctor.        STOP taking these medications   amLODipine 5 MG tablet Commonly known as: NORVASC Stopped by: Wilkie Aye, MD   lisinopril 20 MG tablet Commonly known as: ZESTRIL Stopped by: Wilkie Aye, MD   nitrofurantoin (macrocrystal-monohydrate) 100 MG capsule Commonly known as: MACROBID Stopped by: Wilkie Aye, MD     TAKE these medications   chlorthalidone 25 MG tablet Commonly known as: HYGROTON Take 25 mg by mouth daily.   cholecalciferol 25 MCG (1000 UNIT) tablet Commonly known as: VITAMIN D3 Take 1,000 Units by mouth daily.   doxycycline 100 MG tablet Commonly known as: VIBRA-TABS Take 100 mg by mouth daily.   ELDERBERRY PO Take by mouth.   furosemide 20 MG tablet Commonly known as: LASIX Take 20 mg by mouth daily as needed.   latanoprost 0.005 % ophthalmic solution Commonly known as: XALATAN Place 1  drop into both eyes at bedtime.   meclizine 12.5 MG tablet Commonly known as: ANTIVERT Take 12.5 mg by mouth 3 (three) times daily as needed for dizziness or nausea.   metoprolol succinate 25 MG 24 hr tablet Commonly known as: TOPROL-XL Take by mouth. What changed: Another medication with the same name was removed. Continue taking this medication, and follow the directions you see here. Changed by: Wilkie Aye, MD   Premarin vaginal cream Generic drug: conjugated estrogens Place 1 Applicatorful vaginally every other day.   rivaroxaban 20 MG Tabs tablet Commonly known as: XARELTO Take 20 mg by mouth daily. What changed: Another medication with the same name was removed. Continue taking this medication, and follow the directions you see here. Changed by: Wilkie Aye, MD   vitamin C 100 MG tablet Take 100 mg by mouth daily.   zinc gluconate 50 MG tablet Take 50 mg by mouth daily.       Allergies: No Known Allergies  Family History: No family history on file.  Social History:  reports that she has quit smoking. She has never used smokeless tobacco. She reports that she does not drink alcohol and does not use drugs.  ROS: All other review of systems were reviewed and are negative except what is noted above in HPI  Physical Exam: BP (!) 165/84   Pulse 64   Temp 98.4 F (  36.9 C)   Ht 5' 4.5" (1.638 m)   Wt 195 lb (88.5 kg)   BMI 32.95 kg/m   Constitutional:  Alert and oriented, No acute distress. HEENT: [place] AT, moist mucus membranes.  Trachea midline, no masses. Cardiovascular: No clubbing, cyanosis, or edema. Respiratory: Normal respiratory effort, no increased work of breathing. GI: Abdomen is soft, nontender, nondistended, no abdominal masses GU: No CVA tenderness.  Lymph: No cervical or inguinal lymphadenopathy. Skin: No rashes, bruises or suspicious lesions. Neurologic: Grossly intact, no focal deficits, moving all 4 extremities. Psychiatric: Normal  mood and affect.  Laboratory Data: Lab Results  Component Value Date   WBC 12.7 (H) 01/23/2019   HGB 10.0 (L) 01/23/2019   HCT 32.7 (L) 01/23/2019   MCV 89.6 01/23/2019   PLT 239 01/23/2019    Lab Results  Component Value Date   CREATININE 1.00 06/02/2020    No results found for: PSA  No results found for: TESTOSTERONE  No results found for: HGBA1C  Urinalysis    Component Value Date/Time   COLORURINE YELLOW 08/10/2017 1437   APPEARANCEUR HAZY (A) 08/10/2017 1437   LABSPEC 1.014 08/10/2017 1437   PHURINE 7.0 08/10/2017 1437   GLUCOSEU NEGATIVE 08/10/2017 1437   HGBUR SMALL (A) 08/10/2017 1437   BILIRUBINUR neg 04/23/2020 1608   KETONESUR NEGATIVE 08/10/2017 1437   PROTEINUR Positive (A) 04/23/2020 1608   PROTEINUR NEGATIVE 08/10/2017 1437   UROBILINOGEN negative (A) 04/23/2020 1608   NITRITE positive 04/23/2020 1608   NITRITE NEGATIVE 08/10/2017 1437   LEUKOCYTESUR Moderate (2+) (A) 04/23/2020 1608    Lab Results  Component Value Date   BACTERIA RARE (A) 08/10/2017    Pertinent Imaging:  No results found for this or any previous visit.  No results found for this or any previous visit.  No results found for this or any previous visit.  No results found for this or any previous visit.  No results found for this or any previous visit.  No results found for this or any previous visit.  Results for orders placed during the hospital encounter of 06/02/20  CT HEMATURIA WORKUP  Narrative CLINICAL DATA:  Painless gross hematuria.  EXAM: CT ABDOMEN AND PELVIS WITHOUT AND WITH CONTRAST  TECHNIQUE: Multidetector CT imaging of the abdomen and pelvis was performed following the standard protocol before and following the bolus administration of intravenous contrast.  CONTRAST:  OMNIPAQUE IOHEXOL 300 MG/ML  SOLN  COMPARISON:  None.  FINDINGS: Lower Chest: No acute findings.  Hepatobiliary: No hepatic masses identified. Multiple cysts are  seen in both right and left lobes. Gallstones are seen, however there is no evidence of cholecystitis or biliary dilatation.  Pancreas:  No mass or inflammatory changes.  Spleen: Within normal limits in size and appearance.  Adrenals/Urinary Tract: No adrenal masses identified. No evidence of urolithiasis or hydronephrosis. A few tiny sub-cm low attention lesions in both kidneys are too small to characterize, but most likely represent tiny cysts. No complex cystic or solid renal masses identified. No masses seen involving the ureters or bladder.  Stomach/Bowel: No evidence of obstruction, inflammatory process or abnormal fluid collections. Diverticulosis is seen mainly involving the descending and sigmoid colon, however there is no evidence of diverticulitis.  Vascular/Lymphatic: No pathologically enlarged lymph nodes. No abdominal aortic aneurysm. Aortic atherosclerosis noted.  Reproductive:  No mass or other significant abnormality.  Other:  None.  Musculoskeletal:  No suspicious bone lesions identified.  IMPRESSION: No radiographic evidence of urinary tract neoplasm, urolithiasis, or  hydronephrosis.  Cholelithiasis. No radiographic evidence of cholecystitis.  Colonic diverticulosis, without radiographic evidence of diverticulitis.  Aortic Atherosclerosis (ICD10-I70.0).   Electronically Signed By: Danae Orleans M.D. On: 06/02/2020 16:08  No results found for this or any previous visit.   Assessment & Plan:    1. Urethral Caruncle -Patient will trial premarin cream 3x per week for the next 6 months  No follow-ups on file.  Wilkie Aye, MD  [facility] [place]

## 2020-10-13 NOTE — Progress Notes (Signed)
Urological Symptom Review  Patient is experiencing the following symptoms: Blood in urine Urinary tract infection Vaginal bleeding (female only) Weak stream   Review of Systems  Gastrointestinal (upper)  : Negative for upper GI symptoms  Gastrointestinal (lower) : Negative for lower GI symptoms  Constitutional : Negative for symptoms  Skin: Negative for skin symptoms  Eyes: Negative for eye symptoms  Ear/Nose/Throat : Sinus problems  Hematologic/Lymphatic: Negative for Hematologic/Lymphatic symptoms  Cardiovascular : Negative for cardiovascular symptoms  Respiratory : Negative for respiratory symptoms  Endocrine: Negative for endocrine symptoms  Musculoskeletal: Back pain Joint pain  Neurological: Negative for neurological symptoms  Psychologic: Negative for psychiatric symptoms

## 2020-10-28 DIAGNOSIS — Z299 Encounter for prophylactic measures, unspecified: Secondary | ICD-10-CM | POA: Diagnosis not present

## 2020-10-28 DIAGNOSIS — N390 Urinary tract infection, site not specified: Secondary | ICD-10-CM | POA: Diagnosis not present

## 2020-10-28 DIAGNOSIS — I4891 Unspecified atrial fibrillation: Secondary | ICD-10-CM | POA: Diagnosis not present

## 2020-10-28 DIAGNOSIS — R42 Dizziness and giddiness: Secondary | ICD-10-CM | POA: Diagnosis not present

## 2020-10-28 DIAGNOSIS — I10 Essential (primary) hypertension: Secondary | ICD-10-CM | POA: Diagnosis not present

## 2020-10-28 DIAGNOSIS — M1710 Unilateral primary osteoarthritis, unspecified knee: Secondary | ICD-10-CM | POA: Diagnosis not present

## 2020-10-28 DIAGNOSIS — Z2821 Immunization not carried out because of patient refusal: Secondary | ICD-10-CM | POA: Diagnosis not present

## 2020-12-23 DIAGNOSIS — I10 Essential (primary) hypertension: Secondary | ICD-10-CM | POA: Diagnosis not present

## 2020-12-23 DIAGNOSIS — R350 Frequency of micturition: Secondary | ICD-10-CM | POA: Diagnosis not present

## 2020-12-23 DIAGNOSIS — I2699 Other pulmonary embolism without acute cor pulmonale: Secondary | ICD-10-CM | POA: Diagnosis not present

## 2020-12-23 DIAGNOSIS — Z299 Encounter for prophylactic measures, unspecified: Secondary | ICD-10-CM | POA: Diagnosis not present

## 2020-12-23 DIAGNOSIS — Z789 Other specified health status: Secondary | ICD-10-CM | POA: Diagnosis not present

## 2021-01-27 DIAGNOSIS — I480 Paroxysmal atrial fibrillation: Secondary | ICD-10-CM | POA: Diagnosis not present

## 2021-01-27 DIAGNOSIS — I10 Essential (primary) hypertension: Secondary | ICD-10-CM | POA: Diagnosis not present

## 2021-04-08 ENCOUNTER — Ambulatory Visit: Payer: Medicare Other | Admitting: Urology

## 2021-04-09 DIAGNOSIS — E559 Vitamin D deficiency, unspecified: Secondary | ICD-10-CM | POA: Diagnosis not present

## 2021-04-09 DIAGNOSIS — Z299 Encounter for prophylactic measures, unspecified: Secondary | ICD-10-CM | POA: Diagnosis not present

## 2021-04-09 DIAGNOSIS — Z7189 Other specified counseling: Secondary | ICD-10-CM | POA: Diagnosis not present

## 2021-04-09 DIAGNOSIS — E7800 Pure hypercholesterolemia, unspecified: Secondary | ICD-10-CM | POA: Diagnosis not present

## 2021-04-09 DIAGNOSIS — I10 Essential (primary) hypertension: Secondary | ICD-10-CM | POA: Diagnosis not present

## 2021-04-09 DIAGNOSIS — Z0000 Encounter for general adult medical examination without abnormal findings: Secondary | ICD-10-CM | POA: Diagnosis not present

## 2021-04-09 DIAGNOSIS — Z87891 Personal history of nicotine dependence: Secondary | ICD-10-CM | POA: Diagnosis not present

## 2021-04-09 DIAGNOSIS — R5383 Other fatigue: Secondary | ICD-10-CM | POA: Diagnosis not present

## 2021-04-09 DIAGNOSIS — Z79899 Other long term (current) drug therapy: Secondary | ICD-10-CM | POA: Diagnosis not present

## 2021-08-03 DIAGNOSIS — I480 Paroxysmal atrial fibrillation: Secondary | ICD-10-CM | POA: Diagnosis not present

## 2021-08-03 DIAGNOSIS — I10 Essential (primary) hypertension: Secondary | ICD-10-CM | POA: Diagnosis not present

## 2021-08-17 DIAGNOSIS — I10 Essential (primary) hypertension: Secondary | ICD-10-CM | POA: Diagnosis not present

## 2021-08-17 DIAGNOSIS — N390 Urinary tract infection, site not specified: Secondary | ICD-10-CM | POA: Diagnosis not present

## 2021-08-17 DIAGNOSIS — Z299 Encounter for prophylactic measures, unspecified: Secondary | ICD-10-CM | POA: Diagnosis not present

## 2021-09-18 IMAGING — CT CT ABD-PEL WO/W CM
4 of 12 series · 11 of 46 positions shown, 18 images · IV contrast (Omnipaque or Isovue)
Comparison: None.

CLINICAL DATA: Painless gross hematuria.

EXAM:
CT ABDOMEN AND PELVIS WITHOUT AND WITH CONTRAST
TECHNIQUE: Multidetector CT imaging of the abdomen and pelvis was performed
following the standard protocol before and following the bolus
administration of intravenous contrast.
CONTRAST:  125mL OMNIPAQUE IOHEXOL 300 MG/ML  SOLN

[Series 2: axial pre · axial · non-contrast · 0.79mm/px · z∈[+1105,+1345]mm · 4 of 80 slices shown, 9 images]
[im 16/80  soft-tissue]
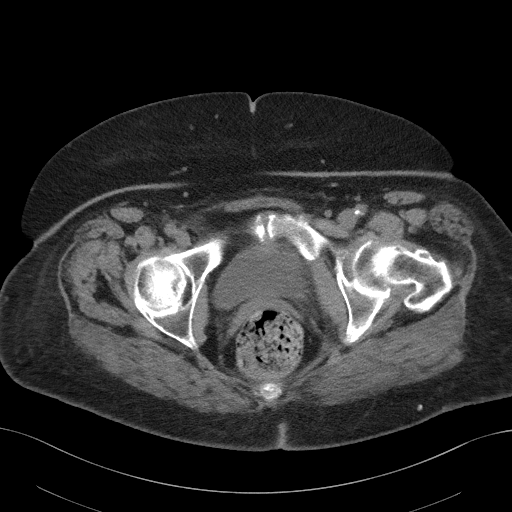
[im 16/80  lung]
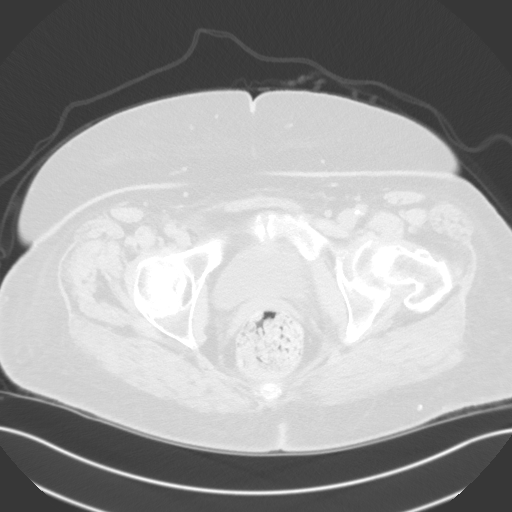
[im 16/80  bone]
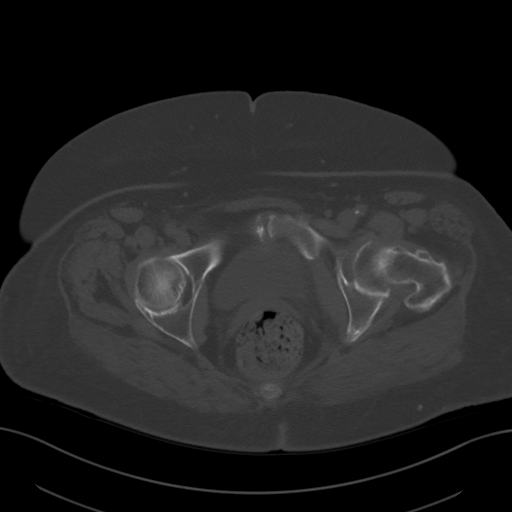
[im 32/80  soft-tissue]
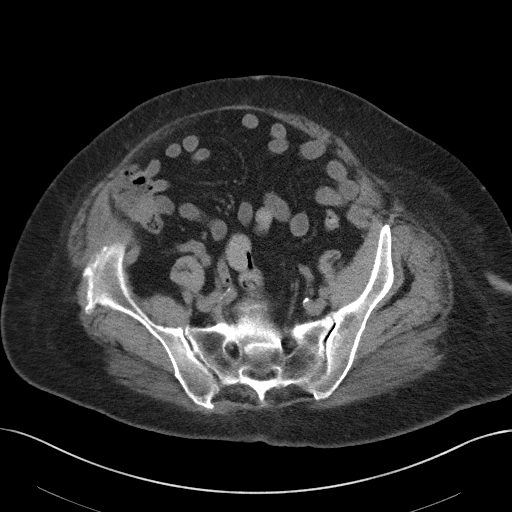
[im 32/80  lung]
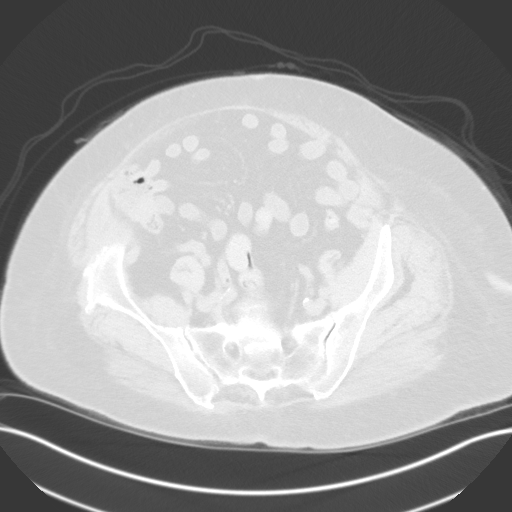
[im 48/80  soft-tissue]
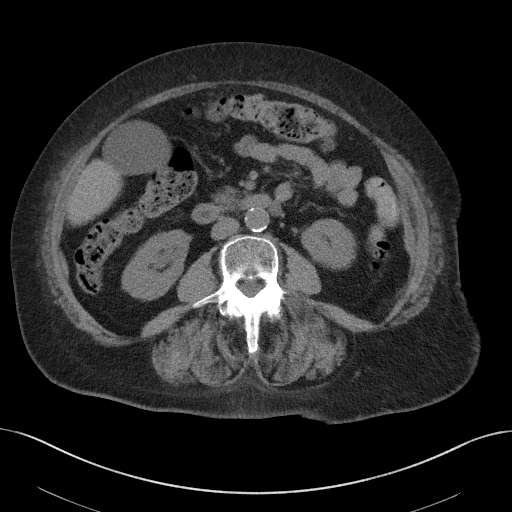
[im 48/80  lung]
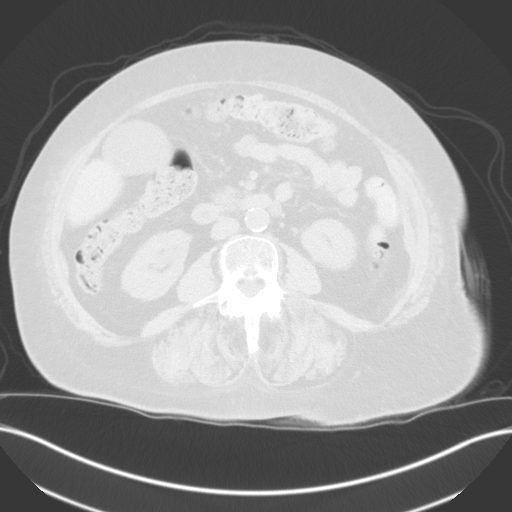
[im 64/80  soft-tissue]
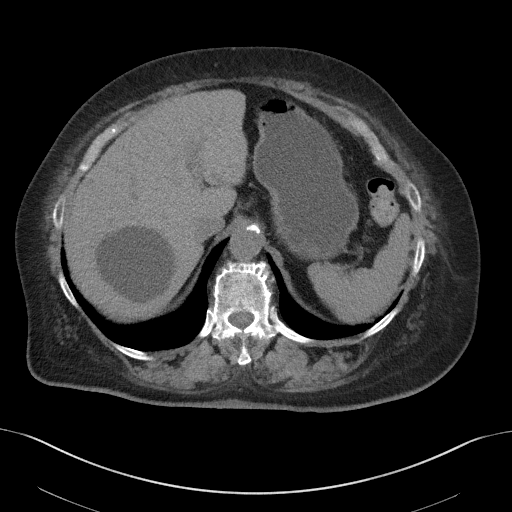
[im 64/80  lung]
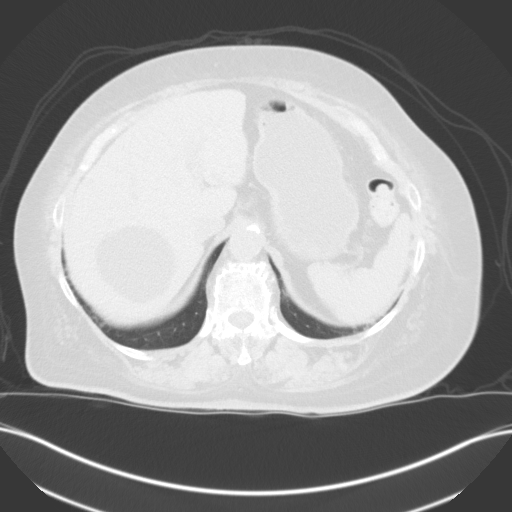

[Series 5: coronal pre · coronal · non-contrast · 0.84mm/px · 2 of 112 slices shown, 3 images]
[im 38/112  soft-tissue]
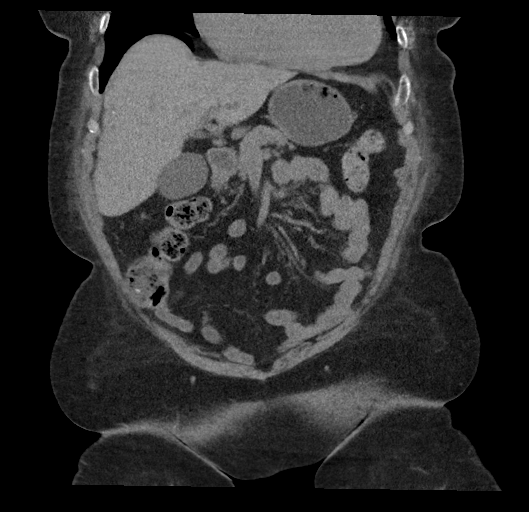
[im 38/112  bone]
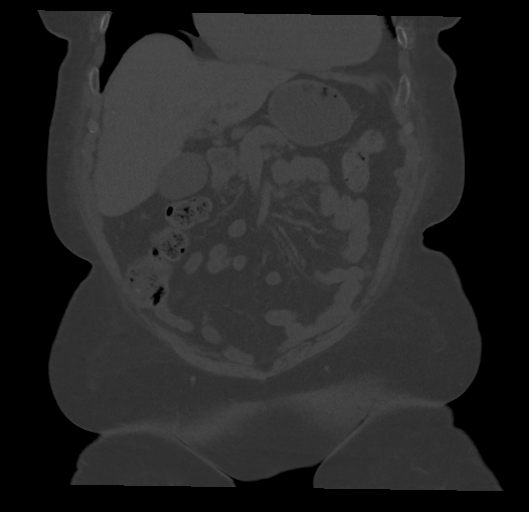
[im 75/112  soft-tissue]
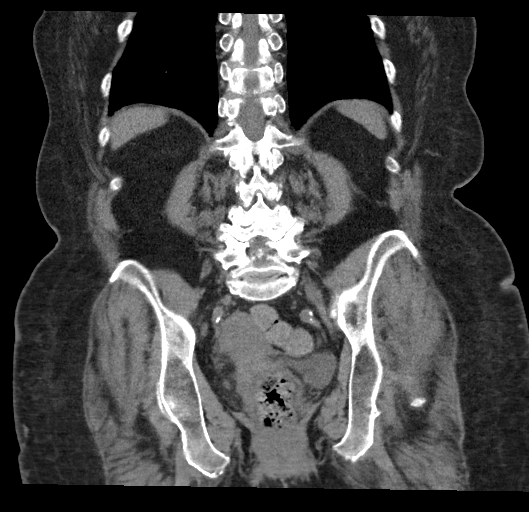

[Series 6: sagittal pre · sagittal · non-contrast · 0.72mm/px · 1 of 148 slices shown, 2 images]
[im 55/148  soft-tissue]
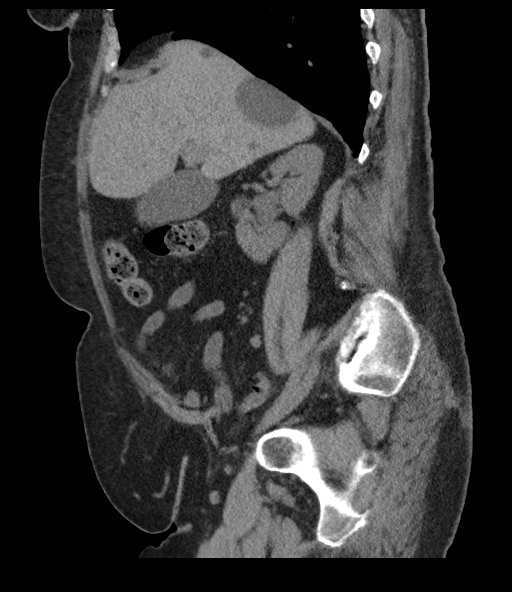
[im 55/148  bone]
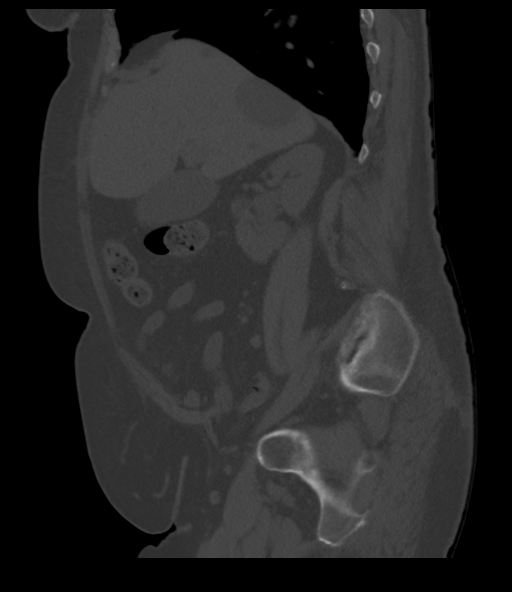

[Series 7: axial post · axial · 0.79mm/px · z∈[+1105,+1345]mm · 4 of 80 slices shown]
[im 16/80  soft-tissue]
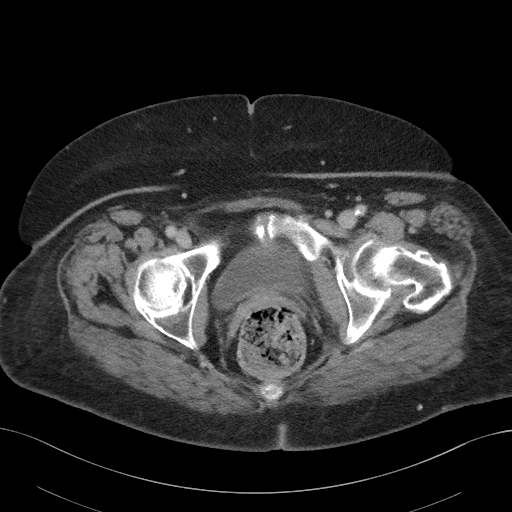
[im 32/80  soft-tissue]
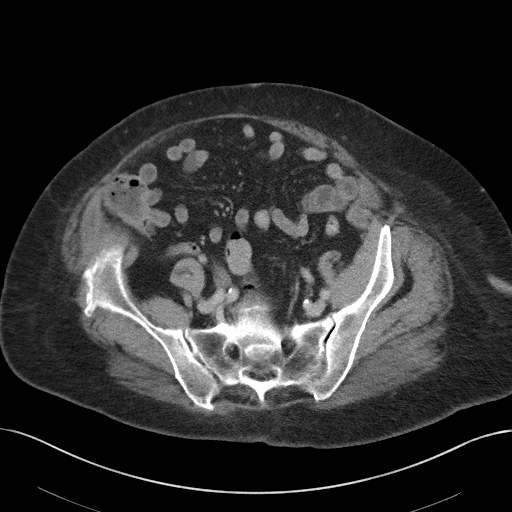
[im 48/80  soft-tissue]
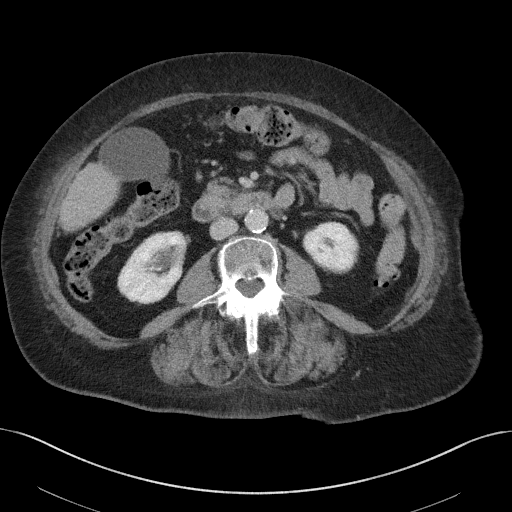
[im 64/80  soft-tissue]
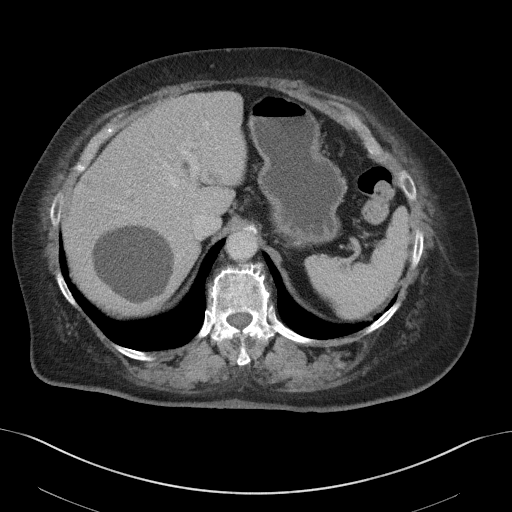

[11 of 46 positions shown; findings below may reference images not displayed]

FINDINGS: Lower Chest: No acute findings.

Hepatobiliary: No hepatic masses identified. Multiple cysts are seen
in both right and left lobes. Gallstones are seen, however there is
no evidence of cholecystitis or biliary dilatation.

Pancreas:  No mass or inflammatory changes.

Spleen: Within normal limits in size and appearance.

Adrenals/Urinary Tract: No adrenal masses identified. No evidence of
urolithiasis or hydronephrosis. A few tiny sub-cm low attention
lesions in both kidneys are too small to characterize, but most
likely represent tiny cysts. No complex cystic or solid renal masses
identified. No masses seen involving the ureters or bladder.

Stomach/Bowel: No evidence of obstruction, inflammatory process or
abnormal fluid collections. Diverticulosis is seen mainly involving
the descending and sigmoid colon, however there is no evidence of
diverticulitis.

Vascular/Lymphatic: No pathologically enlarged lymph nodes. No
abdominal aortic aneurysm. Aortic atherosclerosis noted.

Reproductive:  No mass or other significant abnormality.

Other:  None.

Musculoskeletal:  No suspicious bone lesions identified.
IMPRESSION: No radiographic evidence of urinary tract neoplasm, urolithiasis, or
hydronephrosis.

Cholelithiasis. No radiographic evidence of cholecystitis.

Colonic diverticulosis, without radiographic evidence of
diverticulitis.

Aortic Atherosclerosis ([id number].A).

## 2021-11-10 DIAGNOSIS — H40023 Open angle with borderline findings, high risk, bilateral: Secondary | ICD-10-CM | POA: Diagnosis not present

## 2021-11-10 DIAGNOSIS — H524 Presbyopia: Secondary | ICD-10-CM | POA: Diagnosis not present

## 2021-12-01 DIAGNOSIS — H40023 Open angle with borderline findings, high risk, bilateral: Secondary | ICD-10-CM | POA: Diagnosis not present

## 2022-02-01 DIAGNOSIS — I480 Paroxysmal atrial fibrillation: Secondary | ICD-10-CM | POA: Diagnosis not present

## 2022-02-01 DIAGNOSIS — I10 Essential (primary) hypertension: Secondary | ICD-10-CM | POA: Diagnosis not present

## 2022-02-11 DIAGNOSIS — N390 Urinary tract infection, site not specified: Secondary | ICD-10-CM | POA: Diagnosis not present

## 2022-02-11 DIAGNOSIS — I4891 Unspecified atrial fibrillation: Secondary | ICD-10-CM | POA: Diagnosis not present

## 2022-02-11 DIAGNOSIS — R350 Frequency of micturition: Secondary | ICD-10-CM | POA: Diagnosis not present

## 2022-02-11 DIAGNOSIS — Z299 Encounter for prophylactic measures, unspecified: Secondary | ICD-10-CM | POA: Diagnosis not present

## 2022-02-11 DIAGNOSIS — I739 Peripheral vascular disease, unspecified: Secondary | ICD-10-CM | POA: Diagnosis not present

## 2022-03-08 DIAGNOSIS — Z713 Dietary counseling and surveillance: Secondary | ICD-10-CM | POA: Diagnosis not present

## 2022-03-08 DIAGNOSIS — Z299 Encounter for prophylactic measures, unspecified: Secondary | ICD-10-CM | POA: Diagnosis not present

## 2022-03-08 DIAGNOSIS — M25562 Pain in left knee: Secondary | ICD-10-CM | POA: Diagnosis not present

## 2022-03-08 DIAGNOSIS — M25559 Pain in unspecified hip: Secondary | ICD-10-CM | POA: Diagnosis not present

## 2022-03-08 DIAGNOSIS — R350 Frequency of micturition: Secondary | ICD-10-CM | POA: Diagnosis not present

## 2022-03-08 DIAGNOSIS — M79606 Pain in leg, unspecified: Secondary | ICD-10-CM | POA: Diagnosis not present

## 2022-03-08 DIAGNOSIS — M549 Dorsalgia, unspecified: Secondary | ICD-10-CM | POA: Diagnosis not present

## 2022-03-08 DIAGNOSIS — M8588 Other specified disorders of bone density and structure, other site: Secondary | ICD-10-CM | POA: Diagnosis not present

## 2022-03-08 DIAGNOSIS — M545 Low back pain, unspecified: Secondary | ICD-10-CM | POA: Diagnosis not present

## 2022-03-08 DIAGNOSIS — M25552 Pain in left hip: Secondary | ICD-10-CM | POA: Diagnosis not present

## 2022-03-08 DIAGNOSIS — R933 Abnormal findings on diagnostic imaging of other parts of digestive tract: Secondary | ICD-10-CM | POA: Diagnosis not present

## 2022-03-08 DIAGNOSIS — I10 Essential (primary) hypertension: Secondary | ICD-10-CM | POA: Diagnosis not present

## 2022-03-08 DIAGNOSIS — R5383 Other fatigue: Secondary | ICD-10-CM | POA: Diagnosis not present

## 2022-03-08 DIAGNOSIS — M4316 Spondylolisthesis, lumbar region: Secondary | ICD-10-CM | POA: Diagnosis not present

## 2022-03-08 DIAGNOSIS — Z0000 Encounter for general adult medical examination without abnormal findings: Secondary | ICD-10-CM | POA: Diagnosis not present

## 2022-03-08 DIAGNOSIS — M25569 Pain in unspecified knee: Secondary | ICD-10-CM | POA: Diagnosis not present

## 2022-03-15 ENCOUNTER — Emergency Department (HOSPITAL_COMMUNITY): Payer: Medicare Other

## 2022-03-15 ENCOUNTER — Emergency Department (HOSPITAL_COMMUNITY)
Admission: EM | Admit: 2022-03-15 | Discharge: 2022-03-16 | Disposition: A | Discharge status: Home / Self Care | Payer: Medicare Other | Attending: Emergency Medicine | Admitting: Emergency Medicine

## 2022-03-15 ENCOUNTER — Encounter (HOSPITAL_COMMUNITY): Payer: Self-pay | Admitting: Emergency Medicine

## 2022-03-15 DIAGNOSIS — R0689 Other abnormalities of breathing: Secondary | ICD-10-CM | POA: Diagnosis not present

## 2022-03-15 DIAGNOSIS — M79605 Pain in left leg: Secondary | ICD-10-CM | POA: Diagnosis present

## 2022-03-15 DIAGNOSIS — D72829 Elevated white blood cell count, unspecified: Secondary | ICD-10-CM | POA: Diagnosis not present

## 2022-03-15 DIAGNOSIS — Z7901 Long term (current) use of anticoagulants: Secondary | ICD-10-CM | POA: Diagnosis not present

## 2022-03-15 DIAGNOSIS — M1612 Unilateral primary osteoarthritis, left hip: Secondary | ICD-10-CM | POA: Diagnosis not present

## 2022-03-15 DIAGNOSIS — M5136 Other intervertebral disc degeneration, lumbar region: Secondary | ICD-10-CM | POA: Diagnosis not present

## 2022-03-15 DIAGNOSIS — Z79899 Other long term (current) drug therapy: Secondary | ICD-10-CM | POA: Insufficient documentation

## 2022-03-15 DIAGNOSIS — I10 Essential (primary) hypertension: Secondary | ICD-10-CM | POA: Diagnosis not present

## 2022-03-15 DIAGNOSIS — M1712 Unilateral primary osteoarthritis, left knee: Secondary | ICD-10-CM | POA: Diagnosis not present

## 2022-03-15 DIAGNOSIS — Z743 Need for continuous supervision: Secondary | ICD-10-CM | POA: Diagnosis not present

## 2022-03-15 DIAGNOSIS — M5417 Radiculopathy, lumbosacral region: Secondary | ICD-10-CM | POA: Diagnosis not present

## 2022-03-15 DIAGNOSIS — R52 Pain, unspecified: Secondary | ICD-10-CM | POA: Diagnosis not present

## 2022-03-15 DIAGNOSIS — R6889 Other general symptoms and signs: Secondary | ICD-10-CM | POA: Diagnosis not present

## 2022-03-15 DIAGNOSIS — M545 Low back pain, unspecified: Secondary | ICD-10-CM | POA: Diagnosis not present

## 2022-03-15 MED ORDER — HYDROCODONE-ACETAMINOPHEN 5-325 MG PO TABS
2.0000 | ORAL_TABLET | Freq: Once | ORAL | Status: AC
  Administered 2022-03-15: 2 via ORAL
  Filled 2022-03-15: qty 2

## 2022-03-15 NOTE — ED Triage Notes (Signed)
Pt c/o left leg pain that radiates up to her lower back for the past week. Pt seen by PCP and told it was arthritis.  ?

## 2022-03-16 DIAGNOSIS — M1712 Unilateral primary osteoarthritis, left knee: Secondary | ICD-10-CM | POA: Diagnosis not present

## 2022-03-16 DIAGNOSIS — M545 Low back pain, unspecified: Secondary | ICD-10-CM | POA: Diagnosis not present

## 2022-03-16 DIAGNOSIS — M1612 Unilateral primary osteoarthritis, left hip: Secondary | ICD-10-CM | POA: Diagnosis not present

## 2022-03-16 DIAGNOSIS — M5136 Other intervertebral disc degeneration, lumbar region: Secondary | ICD-10-CM | POA: Diagnosis not present

## 2022-03-16 LAB — CBC
HCT: 42.1 % (ref 36.0–46.0)
Hemoglobin: 13.6 g/dL (ref 12.0–15.0)
MCH: 28.7 pg (ref 26.0–34.0)
MCHC: 32.3 g/dL (ref 30.0–36.0)
MCV: 88.8 fL (ref 80.0–100.0)
Platelets: 332 10*3/uL (ref 150–400)
RBC: 4.74 MIL/uL (ref 3.87–5.11)
RDW: 14.6 % (ref 11.5–15.5)
WBC: 14.4 10*3/uL — ABNORMAL HIGH (ref 4.0–10.5)
nRBC: 0 % (ref 0.0–0.2)

## 2022-03-16 LAB — BASIC METABOLIC PANEL
Anion gap: 11 (ref 5–15)
BUN: 23 mg/dL (ref 8–23)
CO2: 25 mmol/L (ref 22–32)
Calcium: 9.5 mg/dL (ref 8.9–10.3)
Chloride: 105 mmol/L (ref 98–111)
Creatinine, Ser: 0.76 mg/dL (ref 0.44–1.00)
GFR, Estimated: >60 mL/min (ref >60)
Glucose, Bld: 112 mg/dL — ABNORMAL HIGH (ref 70–99)
Potassium: 3.7 mmol/L (ref 3.5–5.1)
Sodium: 141 mmol/L (ref 135–145)

## 2022-03-16 MED ORDER — HYDROCODONE-ACETAMINOPHEN 5-325 MG PO TABS: 1.0000 | ORAL_TABLET | Freq: Four times a day (QID) | ORAL | 0 refills | Status: DC | PRN

## 2022-03-16 MED ORDER — FENTANYL CITRATE PF 50 MCG/ML IJ SOSY
50.0000 ug | PREFILLED_SYRINGE | Freq: Once | INTRAMUSCULAR | Status: AC
  Administered 2022-03-16: 50 ug via INTRAVENOUS
  Filled 2022-03-16: qty 1

## 2022-03-16 MED ORDER — METHOCARBAMOL 500 MG PO TABS
500.0000 mg | ORAL_TABLET | Freq: Once | ORAL | Status: AC
  Administered 2022-03-16: 500 mg via ORAL
  Filled 2022-03-16: qty 1

## 2022-03-16 MED ORDER — KETOROLAC TROMETHAMINE 15 MG/ML IJ SOLN
15.0000 mg | Freq: Once | INTRAMUSCULAR | Status: AC
Start: 2022-03-16 — End: 2022-03-16
  Administered 2022-03-16: 15 mg via INTRAVENOUS
  Filled 2022-03-16: qty 1

## 2022-03-16 NOTE — ED Provider Notes (Signed)
?[facility] ?Provider Note ? ? ?CSN: [id number] ?Arrival date & time: 03/15/22  2011 ? ?  ? ?History ? ?Chief Complaint  ?Patient presents with  ? Leg Pain  ? ? ?Diana [name] is a 88 y.o. female. ? ?The history is provided by the patient.  ?Leg Pain ?Location:  Leg ?Time since incident:  1 week ?Pain details:  ?  Quality:  Aching ?  Severity:  Moderate ?  Onset quality:  Gradual ?  Timing:  Constant ?  Progression:  Worsening ?Chronicity:  New ?Associated symptoms: back pain   ?Associated symptoms: no fever   ?Patient presents with left leg pain and back pain.  She reports starting about a week ago she began having pain in her left thigh and knee into her low back.  It hurts to walk.  No new weakness.  No incontinence.  No fevers or vomiting.  No trauma. ?She was told by her PCP she has arthritis.  No previous back or hip surgery ?She uses a walker at baseline and this is becoming more difficult due to the pain ?  ? ?Home Medications ?Prior to Admission medications   ?Medication Sig Start Date End Date Taking? Authorizing Provider  ?HYDROcodone-acetaminophen (NORCO/VICODIN) 5-325 MG tablet Take 1 tablet by mouth every 6 (six) hours as needed for severe pain. 03/16/22  Yes Zadie RhineWickline, Hallel Denherder, MD  ?Ascorbic Acid (VITAMIN C) 100 MG tablet Take 100 mg by mouth daily.    [provider]  ?chlorthalidone (HYGROTON) 25 MG tablet Take 25 mg by mouth daily. 12/27/19   [provider]  ?cholecalciferol (VITAMIN D3) 25 MCG (1000 UT) tablet Take 1,000 Units by mouth daily.    [provider]  ?conjugated estrogens (PREMARIN) vaginal cream Place 1 Applicatorful vaginally every other day. ?Patient not taking: Reported on 10/13/2020 06/04/20   Malen GauzeMcKenzie, Patrick L, MD  ?doxycycline (VIBRA-TABS) 100 MG tablet Take 100 mg by mouth daily. 10/03/20   [provider]  ?ELDERBERRY PO Take by mouth.    [provider]  ?furosemide (LASIX) 20 MG tablet Take 20 mg by mouth  daily as needed. 11/21/19   [provider]  ?latanoprost (XALATAN) 0.005 % ophthalmic solution Place 1 drop into both eyes at bedtime. 07/14/17   [provider]  ?meclizine (ANTIVERT) 12.5 MG tablet Take 12.5 mg by mouth 3 (three) times daily as needed for dizziness or nausea.  07/14/16   [provider]  ?metoprolol succinate (TOPROL-XL) 25 MG 24 hr tablet Take by mouth. 04/14/20 05/14/20  [provider]  ?rivaroxaban (XARELTO) 20 MG TABS tablet Take 20 mg by mouth daily.    [provider]  ?zinc gluconate 50 MG tablet Take 50 mg by mouth daily.    [provider]  ?   ? ?Allergies    ?Patient has no known allergies.   ? ?Review of Systems   ?Review of Systems  ?Constitutional:  Negative for fever.  ?Musculoskeletal:  Positive for arthralgias and back pain.  ?Neurological:  Negative for weakness.  ? ?Physical Exam ?Updated Vital Signs ?BP (!) 151/81   Pulse 88   Temp 98 ?F (36.7 ?C) (Oral)   Resp 19   Ht 1.626 m (5\' 4" )   Wt 87.5 kg   SpO2 95%   BMI 33.13 kg/m?  ?Physical Exam ?CONSTITUTIONAL: Elderly, no acute ?HEAD: Normocephalic/atraumatic ?EYES: EOMI/PERRL ?ENMT: Mucous membranes moist ?NECK: supple no meningeal signs ?SPINE/BACK:entire spine nontender lumbar paraspinal tenderness noted, no bruising/crepitance/stepoffs noted to  spine ?ABDOMEN: soft, nontender, no rebound or guarding ?GU:no cva tenderness ?NEURO: Awake/alert, equal motor 5/5 strength noted with the following: hip flexion/knee flexion/extension, foot dorsi/plantar flexion, great toe extension intact bilaterally,  no sensory deficit in any dermatome.   ?EXTREMITIES: pulses normal, full ROM, no deformities, no bruising, mild tenderness to the left thigh.  Distal pulses intact ?SKIN: warm, color normal ?PSYCH: no abnormalities of mood noted, alert and oriented to situation ? ?ED Results / Procedures / Treatments   ?Labs ?(all labs ordered are listed, but only abnormal results are  displayed) ?Labs Reviewed  ?CBC - Abnormal; Notable for the following components:  ?    Result Value  ? WBC 14.4 (*)   ? All other components within normal limits  ?BASIC METABOLIC PANEL - Abnormal; Notable for the following components:  ? Glucose, Bld 112 (*)   ? All other components within normal limits  ? ? ?EKG ?None ? ?Radiology ?DG Lumbar Spine Complete ? ?Result Date: 03/16/2022 ?CLINICAL DATA:  Leg pain EXAM: LUMBAR SPINE - COMPLETE 4+ VIEW COMPARISON:  CT 06/02/2020 FINDINGS: Calcifications in the right upper quadrant consistent with gallstones. Grade 1 anterolisthesis L4 on L5. Trace retrolisthesis L1 on L2. Mild degenerative osteophyte at L1-L2. Mild disc space narrowing at L3-L4 and L5-S1. Prominent facet degenerative changes of the lower lumbar spine. IMPRESSION: Multilevel degenerative changes of the lumbar spine without acute osseous abnormality. Gallstones Electronically Signed   By: Jasmine Pang M.D.   On: 03/16/2022 00:26  ? ?DG Knee Complete 4 Views Left ? ?Result Date: 03/16/2022 ?CLINICAL DATA:  Knee pain EXAM: LEFT KNEE - COMPLETE 4+ VIEW COMPARISON:  None. FINDINGS: No fracture or malalignment. Tricompartment arthritis with moderate patellofemoral degenerative change. No sizable knee effusion. IMPRESSION: Mild to moderate tricompartment arthritis of the knee without acute osseous abnormality Electronically Signed   By: Jasmine Pang M.D.   On: 03/16/2022 00:27  ? ?DG Hip Unilat W or Wo Pelvis 2-3 Views Left ? ?Result Date: 03/16/2022 ?CLINICAL DATA:  Hip pain EXAM: DG HIP (WITH OR WITHOUT PELVIS) 2-3V LEFT COMPARISON:  None. FINDINGS: SI joints are patent. Pubic symphysis and rami appear intact. Mild degenerative changes of both hips. No fracture or malalignment IMPRESSION: Mild degenerative change.  No acute osseous abnormality Electronically Signed   By: Jasmine Pang M.D.   On: 03/16/2022 00:28   ? ?Procedures ?Procedures  ? ? ?Medications Ordered in ED ?Medications   ?HYDROcodone-acetaminophen (NORCO/VICODIN) 5-325 MG per tablet 2 tablet (2 tablets Oral Given 03/15/22 2342)  ?fentaNYL (SUBLIMAZE) injection 50 mcg (50 mcg Intravenous Given 03/16/22 0131)  ?ketorolac (TORADOL) 15 MG/ML injection 15 mg (15 mg Intravenous Given 03/16/22 0345)  ?methocarbamol (ROBAXIN) tablet 500 mg (500 mg Oral Given 03/16/22 0345)  ? ? ?ED Course/ Medical Decision Making/ A&P ?Clinical Course as of 03/16/22 0522  ?Tue Mar 16, 2022  ?0049 Pt now feeling dizzy from pain meds and cannot stand up.  No new weakness.  Pain minimally improved.  Plan to monitor overnight, order home health/PT and plan for d/c in the morning [DW]  ?0148 WBC(!): 14.4 ?leukocytosis [DW]  ?0521 Pt now ambulatory ?Family will pick up.  Home health ordered [DW]  ?  ?Clinical Course User Index ?[DW] Zadie Rhine, MD  ? ?                        ?Medical Decision Making ?Amount and/or Complexity of Data Reviewed ?Labs: ordered. Decision-making details documented in ED  Course. ?Radiology: ordered. ? ?Risk ?Prescription drug management. ? ? ?This patient presents to the ED for concern of back and leg pain, this involves an extensive number of treatment options, and is a complaint that carries with it a high risk of complications and morbidity.  The differential diagnosis includes but is not limited to muscle strain, lumbosacral radiculopathy, occult fracture ? ?Comorbidities that complicate the patient evaluation: ?Patient?s presentation is complicated by their history of hypertension ? ?Social Determinants of Health: ?Patient?s  impaired mobility   increases the complexity of managing their presentation ? ?Additional history obtained: ?Additional history obtained from family ? ?LABS REVIEWED ?Mild leukocytosis noted ? ?Imaging Studies ordered: ?I ordered imaging studies including X-ray lumbar/hip/knee   ?I independently visualized and interpreted imaging which showed arthritis, no acute findings ?I agree with the radiologist  interpretation ?  ? ?Medicines ordered and prescription drug management: ?I ordered medication including oral Vicodin for pain ?Reevaluation of the patient after these medicines showed that the patient    improved ? ?Reevaluation: ?After the interven

## 2022-03-16 NOTE — ED Notes (Signed)
Pt ambulated with minimal assistance with walker. Pt states she has walker at home.  ?

## 2022-03-16 NOTE — ED Notes (Signed)
Family here to pick up pt. Assisted to wheelchair.  ?

## 2022-03-16 NOTE — ED Notes (Signed)
Pt urinated in brief right before purewick placed  ?

## 2022-03-16 NOTE — ED Notes (Signed)
Family called, told that she will be up for d/c soon if they can come and get her  ?

## 2022-03-19 DIAGNOSIS — I4891 Unspecified atrial fibrillation: Secondary | ICD-10-CM | POA: Diagnosis not present

## 2022-03-19 DIAGNOSIS — Z299 Encounter for prophylactic measures, unspecified: Secondary | ICD-10-CM | POA: Diagnosis not present

## 2022-03-19 DIAGNOSIS — I10 Essential (primary) hypertension: Secondary | ICD-10-CM | POA: Diagnosis not present

## 2022-03-19 DIAGNOSIS — M549 Dorsalgia, unspecified: Secondary | ICD-10-CM | POA: Diagnosis not present

## 2022-03-23 DIAGNOSIS — Z299 Encounter for prophylactic measures, unspecified: Secondary | ICD-10-CM | POA: Diagnosis not present

## 2022-03-23 DIAGNOSIS — M48061 Spinal stenosis, lumbar region without neurogenic claudication: Secondary | ICD-10-CM | POA: Diagnosis not present

## 2022-03-23 DIAGNOSIS — M47815 Spondylosis without myelopathy or radiculopathy, thoracolumbar region: Secondary | ICD-10-CM | POA: Diagnosis not present

## 2022-03-23 DIAGNOSIS — M5126 Other intervertebral disc displacement, lumbar region: Secondary | ICD-10-CM | POA: Diagnosis not present

## 2022-03-23 DIAGNOSIS — M4315 Spondylolisthesis, thoracolumbar region: Secondary | ICD-10-CM | POA: Diagnosis not present

## 2022-03-23 DIAGNOSIS — K8020 Calculus of gallbladder without cholecystitis without obstruction: Secondary | ICD-10-CM | POA: Diagnosis not present

## 2022-03-23 DIAGNOSIS — M47817 Spondylosis without myelopathy or radiculopathy, lumbosacral region: Secondary | ICD-10-CM | POA: Diagnosis not present

## 2022-03-23 DIAGNOSIS — M47816 Spondylosis without myelopathy or radiculopathy, lumbar region: Secondary | ICD-10-CM | POA: Diagnosis not present

## 2022-03-23 DIAGNOSIS — Z713 Dietary counseling and surveillance: Secondary | ICD-10-CM | POA: Diagnosis not present

## 2022-03-23 DIAGNOSIS — M4316 Spondylolisthesis, lumbar region: Secondary | ICD-10-CM | POA: Diagnosis not present

## 2022-03-23 DIAGNOSIS — R5383 Other fatigue: Secondary | ICD-10-CM | POA: Diagnosis not present

## 2022-03-23 DIAGNOSIS — M545 Low back pain, unspecified: Secondary | ICD-10-CM | POA: Diagnosis not present

## 2022-03-23 DIAGNOSIS — M4807 Spinal stenosis, lumbosacral region: Secondary | ICD-10-CM | POA: Diagnosis not present

## 2022-03-23 DIAGNOSIS — M4317 Spondylolisthesis, lumbosacral region: Secondary | ICD-10-CM | POA: Diagnosis not present

## 2022-04-05 DIAGNOSIS — I10 Essential (primary) hypertension: Secondary | ICD-10-CM | POA: Diagnosis not present

## 2022-04-05 DIAGNOSIS — M549 Dorsalgia, unspecified: Secondary | ICD-10-CM | POA: Diagnosis not present

## 2022-04-05 DIAGNOSIS — Z299 Encounter for prophylactic measures, unspecified: Secondary | ICD-10-CM | POA: Diagnosis not present

## 2022-04-05 DIAGNOSIS — Z713 Dietary counseling and surveillance: Secondary | ICD-10-CM | POA: Diagnosis not present

## 2022-04-20 DIAGNOSIS — I951 Orthostatic hypotension: Secondary | ICD-10-CM | POA: Diagnosis not present

## 2022-04-20 DIAGNOSIS — K219 Gastro-esophageal reflux disease without esophagitis: Secondary | ICD-10-CM | POA: Diagnosis not present

## 2022-04-20 DIAGNOSIS — M542 Cervicalgia: Secondary | ICD-10-CM | POA: Diagnosis not present

## 2022-04-20 DIAGNOSIS — M79661 Pain in right lower leg: Secondary | ICD-10-CM | POA: Diagnosis not present

## 2022-04-20 DIAGNOSIS — I10 Essential (primary) hypertension: Secondary | ICD-10-CM | POA: Diagnosis not present

## 2022-04-20 DIAGNOSIS — I4891 Unspecified atrial fibrillation: Secondary | ICD-10-CM | POA: Diagnosis not present

## 2022-04-20 DIAGNOSIS — G629 Polyneuropathy, unspecified: Secondary | ICD-10-CM | POA: Diagnosis not present

## 2022-04-20 DIAGNOSIS — M79606 Pain in leg, unspecified: Secondary | ICD-10-CM | POA: Diagnosis not present

## 2022-04-20 DIAGNOSIS — Z87891 Personal history of nicotine dependence: Secondary | ICD-10-CM | POA: Diagnosis not present

## 2022-04-20 DIAGNOSIS — Z86718 Personal history of other venous thrombosis and embolism: Secondary | ICD-10-CM | POA: Diagnosis not present

## 2022-04-20 DIAGNOSIS — G4700 Insomnia, unspecified: Secondary | ICD-10-CM | POA: Diagnosis not present

## 2022-04-20 DIAGNOSIS — M810 Age-related osteoporosis without current pathological fracture: Secondary | ICD-10-CM | POA: Diagnosis not present

## 2022-04-20 DIAGNOSIS — I493 Ventricular premature depolarization: Secondary | ICD-10-CM | POA: Diagnosis not present

## 2022-04-20 DIAGNOSIS — I491 Atrial premature depolarization: Secondary | ICD-10-CM | POA: Diagnosis not present

## 2022-04-20 DIAGNOSIS — E7800 Pure hypercholesterolemia, unspecified: Secondary | ICD-10-CM | POA: Diagnosis not present

## 2022-04-20 DIAGNOSIS — M791 Myalgia, unspecified site: Secondary | ICD-10-CM | POA: Diagnosis not present

## 2022-04-20 DIAGNOSIS — M5442 Lumbago with sciatica, left side: Secondary | ICD-10-CM | POA: Diagnosis not present

## 2022-04-20 DIAGNOSIS — I739 Peripheral vascular disease, unspecified: Secondary | ICD-10-CM | POA: Diagnosis not present

## 2022-04-28 DIAGNOSIS — Z86718 Personal history of other venous thrombosis and embolism: Secondary | ICD-10-CM | POA: Diagnosis not present

## 2022-04-28 DIAGNOSIS — G4700 Insomnia, unspecified: Secondary | ICD-10-CM | POA: Diagnosis not present

## 2022-04-28 DIAGNOSIS — G629 Polyneuropathy, unspecified: Secondary | ICD-10-CM | POA: Diagnosis not present

## 2022-04-28 DIAGNOSIS — E7800 Pure hypercholesterolemia, unspecified: Secondary | ICD-10-CM | POA: Diagnosis not present

## 2022-04-28 DIAGNOSIS — M542 Cervicalgia: Secondary | ICD-10-CM | POA: Diagnosis not present

## 2022-04-28 DIAGNOSIS — M5442 Lumbago with sciatica, left side: Secondary | ICD-10-CM | POA: Diagnosis not present

## 2022-04-28 DIAGNOSIS — M810 Age-related osteoporosis without current pathological fracture: Secondary | ICD-10-CM | POA: Diagnosis not present

## 2022-04-28 DIAGNOSIS — M79661 Pain in right lower leg: Secondary | ICD-10-CM | POA: Diagnosis not present

## 2022-04-28 DIAGNOSIS — Z87891 Personal history of nicotine dependence: Secondary | ICD-10-CM | POA: Diagnosis not present

## 2022-04-28 DIAGNOSIS — I10 Essential (primary) hypertension: Secondary | ICD-10-CM | POA: Diagnosis not present

## 2022-04-28 DIAGNOSIS — I951 Orthostatic hypotension: Secondary | ICD-10-CM | POA: Diagnosis not present

## 2022-04-28 DIAGNOSIS — I491 Atrial premature depolarization: Secondary | ICD-10-CM | POA: Diagnosis not present

## 2022-04-28 DIAGNOSIS — M79606 Pain in leg, unspecified: Secondary | ICD-10-CM | POA: Diagnosis not present

## 2022-04-28 DIAGNOSIS — K219 Gastro-esophageal reflux disease without esophagitis: Secondary | ICD-10-CM | POA: Diagnosis not present

## 2022-04-28 DIAGNOSIS — I4891 Unspecified atrial fibrillation: Secondary | ICD-10-CM | POA: Diagnosis not present

## 2022-04-28 DIAGNOSIS — I739 Peripheral vascular disease, unspecified: Secondary | ICD-10-CM | POA: Diagnosis not present

## 2022-04-28 DIAGNOSIS — M791 Myalgia, unspecified site: Secondary | ICD-10-CM | POA: Diagnosis not present

## 2022-04-28 DIAGNOSIS — I493 Ventricular premature depolarization: Secondary | ICD-10-CM | POA: Diagnosis not present

## 2022-04-30 DIAGNOSIS — M791 Myalgia, unspecified site: Secondary | ICD-10-CM | POA: Diagnosis not present

## 2022-04-30 DIAGNOSIS — I951 Orthostatic hypotension: Secondary | ICD-10-CM | POA: Diagnosis not present

## 2022-04-30 DIAGNOSIS — I4891 Unspecified atrial fibrillation: Secondary | ICD-10-CM | POA: Diagnosis not present

## 2022-04-30 DIAGNOSIS — M79606 Pain in leg, unspecified: Secondary | ICD-10-CM | POA: Diagnosis not present

## 2022-04-30 DIAGNOSIS — M542 Cervicalgia: Secondary | ICD-10-CM | POA: Diagnosis not present

## 2022-04-30 DIAGNOSIS — Z87891 Personal history of nicotine dependence: Secondary | ICD-10-CM | POA: Diagnosis not present

## 2022-04-30 DIAGNOSIS — I493 Ventricular premature depolarization: Secondary | ICD-10-CM | POA: Diagnosis not present

## 2022-04-30 DIAGNOSIS — M79661 Pain in right lower leg: Secondary | ICD-10-CM | POA: Diagnosis not present

## 2022-04-30 DIAGNOSIS — I739 Peripheral vascular disease, unspecified: Secondary | ICD-10-CM | POA: Diagnosis not present

## 2022-04-30 DIAGNOSIS — I10 Essential (primary) hypertension: Secondary | ICD-10-CM | POA: Diagnosis not present

## 2022-04-30 DIAGNOSIS — Z86718 Personal history of other venous thrombosis and embolism: Secondary | ICD-10-CM | POA: Diagnosis not present

## 2022-04-30 DIAGNOSIS — M5442 Lumbago with sciatica, left side: Secondary | ICD-10-CM | POA: Diagnosis not present

## 2022-04-30 DIAGNOSIS — G629 Polyneuropathy, unspecified: Secondary | ICD-10-CM | POA: Diagnosis not present

## 2022-04-30 DIAGNOSIS — I491 Atrial premature depolarization: Secondary | ICD-10-CM | POA: Diagnosis not present

## 2022-04-30 DIAGNOSIS — K219 Gastro-esophageal reflux disease without esophagitis: Secondary | ICD-10-CM | POA: Diagnosis not present

## 2022-04-30 DIAGNOSIS — M810 Age-related osteoporosis without current pathological fracture: Secondary | ICD-10-CM | POA: Diagnosis not present

## 2022-04-30 DIAGNOSIS — E7800 Pure hypercholesterolemia, unspecified: Secondary | ICD-10-CM | POA: Diagnosis not present

## 2022-04-30 DIAGNOSIS — G4700 Insomnia, unspecified: Secondary | ICD-10-CM | POA: Diagnosis not present

## 2022-05-03 DIAGNOSIS — G629 Polyneuropathy, unspecified: Secondary | ICD-10-CM | POA: Diagnosis not present

## 2022-05-03 DIAGNOSIS — M542 Cervicalgia: Secondary | ICD-10-CM | POA: Diagnosis not present

## 2022-05-03 DIAGNOSIS — I491 Atrial premature depolarization: Secondary | ICD-10-CM | POA: Diagnosis not present

## 2022-05-03 DIAGNOSIS — M5442 Lumbago with sciatica, left side: Secondary | ICD-10-CM | POA: Diagnosis not present

## 2022-05-03 DIAGNOSIS — E7800 Pure hypercholesterolemia, unspecified: Secondary | ICD-10-CM | POA: Diagnosis not present

## 2022-05-03 DIAGNOSIS — M791 Myalgia, unspecified site: Secondary | ICD-10-CM | POA: Diagnosis not present

## 2022-05-03 DIAGNOSIS — G4700 Insomnia, unspecified: Secondary | ICD-10-CM | POA: Diagnosis not present

## 2022-05-03 DIAGNOSIS — M79661 Pain in right lower leg: Secondary | ICD-10-CM | POA: Diagnosis not present

## 2022-05-03 DIAGNOSIS — I739 Peripheral vascular disease, unspecified: Secondary | ICD-10-CM | POA: Diagnosis not present

## 2022-05-03 DIAGNOSIS — Z86718 Personal history of other venous thrombosis and embolism: Secondary | ICD-10-CM | POA: Diagnosis not present

## 2022-05-03 DIAGNOSIS — I493 Ventricular premature depolarization: Secondary | ICD-10-CM | POA: Diagnosis not present

## 2022-05-03 DIAGNOSIS — M79606 Pain in leg, unspecified: Secondary | ICD-10-CM | POA: Diagnosis not present

## 2022-05-03 DIAGNOSIS — I951 Orthostatic hypotension: Secondary | ICD-10-CM | POA: Diagnosis not present

## 2022-05-03 DIAGNOSIS — K219 Gastro-esophageal reflux disease without esophagitis: Secondary | ICD-10-CM | POA: Diagnosis not present

## 2022-05-03 DIAGNOSIS — I10 Essential (primary) hypertension: Secondary | ICD-10-CM | POA: Diagnosis not present

## 2022-05-03 DIAGNOSIS — I4891 Unspecified atrial fibrillation: Secondary | ICD-10-CM | POA: Diagnosis not present

## 2022-05-03 DIAGNOSIS — M810 Age-related osteoporosis without current pathological fracture: Secondary | ICD-10-CM | POA: Diagnosis not present

## 2022-05-03 DIAGNOSIS — Z87891 Personal history of nicotine dependence: Secondary | ICD-10-CM | POA: Diagnosis not present

## 2022-05-05 DIAGNOSIS — Z87891 Personal history of nicotine dependence: Secondary | ICD-10-CM | POA: Diagnosis not present

## 2022-05-05 DIAGNOSIS — I951 Orthostatic hypotension: Secondary | ICD-10-CM | POA: Diagnosis not present

## 2022-05-05 DIAGNOSIS — G629 Polyneuropathy, unspecified: Secondary | ICD-10-CM | POA: Diagnosis not present

## 2022-05-05 DIAGNOSIS — I739 Peripheral vascular disease, unspecified: Secondary | ICD-10-CM | POA: Diagnosis not present

## 2022-05-05 DIAGNOSIS — M79606 Pain in leg, unspecified: Secondary | ICD-10-CM | POA: Diagnosis not present

## 2022-05-05 DIAGNOSIS — G4700 Insomnia, unspecified: Secondary | ICD-10-CM | POA: Diagnosis not present

## 2022-05-05 DIAGNOSIS — M79661 Pain in right lower leg: Secondary | ICD-10-CM | POA: Diagnosis not present

## 2022-05-05 DIAGNOSIS — M810 Age-related osteoporosis without current pathological fracture: Secondary | ICD-10-CM | POA: Diagnosis not present

## 2022-05-05 DIAGNOSIS — I491 Atrial premature depolarization: Secondary | ICD-10-CM | POA: Diagnosis not present

## 2022-05-05 DIAGNOSIS — M791 Myalgia, unspecified site: Secondary | ICD-10-CM | POA: Diagnosis not present

## 2022-05-05 DIAGNOSIS — K219 Gastro-esophageal reflux disease without esophagitis: Secondary | ICD-10-CM | POA: Diagnosis not present

## 2022-05-05 DIAGNOSIS — M5442 Lumbago with sciatica, left side: Secondary | ICD-10-CM | POA: Diagnosis not present

## 2022-05-05 DIAGNOSIS — M542 Cervicalgia: Secondary | ICD-10-CM | POA: Diagnosis not present

## 2022-05-05 DIAGNOSIS — I493 Ventricular premature depolarization: Secondary | ICD-10-CM | POA: Diagnosis not present

## 2022-05-05 DIAGNOSIS — I10 Essential (primary) hypertension: Secondary | ICD-10-CM | POA: Diagnosis not present

## 2022-05-05 DIAGNOSIS — I4891 Unspecified atrial fibrillation: Secondary | ICD-10-CM | POA: Diagnosis not present

## 2022-05-05 DIAGNOSIS — E7800 Pure hypercholesterolemia, unspecified: Secondary | ICD-10-CM | POA: Diagnosis not present

## 2022-05-05 DIAGNOSIS — Z86718 Personal history of other venous thrombosis and embolism: Secondary | ICD-10-CM | POA: Diagnosis not present

## 2022-05-06 DIAGNOSIS — I10 Essential (primary) hypertension: Secondary | ICD-10-CM | POA: Diagnosis not present

## 2022-05-06 DIAGNOSIS — M545 Low back pain, unspecified: Secondary | ICD-10-CM | POA: Diagnosis not present

## 2022-05-06 DIAGNOSIS — I4891 Unspecified atrial fibrillation: Secondary | ICD-10-CM | POA: Diagnosis not present

## 2022-05-06 DIAGNOSIS — Z299 Encounter for prophylactic measures, unspecified: Secondary | ICD-10-CM | POA: Diagnosis not present

## 2022-05-07 DIAGNOSIS — I491 Atrial premature depolarization: Secondary | ICD-10-CM | POA: Diagnosis not present

## 2022-05-07 DIAGNOSIS — Z86718 Personal history of other venous thrombosis and embolism: Secondary | ICD-10-CM | POA: Diagnosis not present

## 2022-05-07 DIAGNOSIS — K219 Gastro-esophageal reflux disease without esophagitis: Secondary | ICD-10-CM | POA: Diagnosis not present

## 2022-05-07 DIAGNOSIS — E7800 Pure hypercholesterolemia, unspecified: Secondary | ICD-10-CM | POA: Diagnosis not present

## 2022-05-07 DIAGNOSIS — G4700 Insomnia, unspecified: Secondary | ICD-10-CM | POA: Diagnosis not present

## 2022-05-07 DIAGNOSIS — I951 Orthostatic hypotension: Secondary | ICD-10-CM | POA: Diagnosis not present

## 2022-05-07 DIAGNOSIS — G629 Polyneuropathy, unspecified: Secondary | ICD-10-CM | POA: Diagnosis not present

## 2022-05-07 DIAGNOSIS — Z87891 Personal history of nicotine dependence: Secondary | ICD-10-CM | POA: Diagnosis not present

## 2022-05-07 DIAGNOSIS — I739 Peripheral vascular disease, unspecified: Secondary | ICD-10-CM | POA: Diagnosis not present

## 2022-05-07 DIAGNOSIS — M791 Myalgia, unspecified site: Secondary | ICD-10-CM | POA: Diagnosis not present

## 2022-05-07 DIAGNOSIS — M79606 Pain in leg, unspecified: Secondary | ICD-10-CM | POA: Diagnosis not present

## 2022-05-07 DIAGNOSIS — I10 Essential (primary) hypertension: Secondary | ICD-10-CM | POA: Diagnosis not present

## 2022-05-07 DIAGNOSIS — M79661 Pain in right lower leg: Secondary | ICD-10-CM | POA: Diagnosis not present

## 2022-05-07 DIAGNOSIS — M810 Age-related osteoporosis without current pathological fracture: Secondary | ICD-10-CM | POA: Diagnosis not present

## 2022-05-07 DIAGNOSIS — I4891 Unspecified atrial fibrillation: Secondary | ICD-10-CM | POA: Diagnosis not present

## 2022-05-07 DIAGNOSIS — I493 Ventricular premature depolarization: Secondary | ICD-10-CM | POA: Diagnosis not present

## 2022-05-07 DIAGNOSIS — M542 Cervicalgia: Secondary | ICD-10-CM | POA: Diagnosis not present

## 2022-05-07 DIAGNOSIS — M5442 Lumbago with sciatica, left side: Secondary | ICD-10-CM | POA: Diagnosis not present

## 2022-05-10 DIAGNOSIS — I493 Ventricular premature depolarization: Secondary | ICD-10-CM | POA: Diagnosis not present

## 2022-05-10 DIAGNOSIS — M79606 Pain in leg, unspecified: Secondary | ICD-10-CM | POA: Diagnosis not present

## 2022-05-10 DIAGNOSIS — Z86718 Personal history of other venous thrombosis and embolism: Secondary | ICD-10-CM | POA: Diagnosis not present

## 2022-05-10 DIAGNOSIS — E7800 Pure hypercholesterolemia, unspecified: Secondary | ICD-10-CM | POA: Diagnosis not present

## 2022-05-10 DIAGNOSIS — I739 Peripheral vascular disease, unspecified: Secondary | ICD-10-CM | POA: Diagnosis not present

## 2022-05-10 DIAGNOSIS — I491 Atrial premature depolarization: Secondary | ICD-10-CM | POA: Diagnosis not present

## 2022-05-10 DIAGNOSIS — G4700 Insomnia, unspecified: Secondary | ICD-10-CM | POA: Diagnosis not present

## 2022-05-10 DIAGNOSIS — I4891 Unspecified atrial fibrillation: Secondary | ICD-10-CM | POA: Diagnosis not present

## 2022-05-10 DIAGNOSIS — M79661 Pain in right lower leg: Secondary | ICD-10-CM | POA: Diagnosis not present

## 2022-05-10 DIAGNOSIS — Z87891 Personal history of nicotine dependence: Secondary | ICD-10-CM | POA: Diagnosis not present

## 2022-05-10 DIAGNOSIS — G629 Polyneuropathy, unspecified: Secondary | ICD-10-CM | POA: Diagnosis not present

## 2022-05-10 DIAGNOSIS — I10 Essential (primary) hypertension: Secondary | ICD-10-CM | POA: Diagnosis not present

## 2022-05-10 DIAGNOSIS — M791 Myalgia, unspecified site: Secondary | ICD-10-CM | POA: Diagnosis not present

## 2022-05-10 DIAGNOSIS — M810 Age-related osteoporosis without current pathological fracture: Secondary | ICD-10-CM | POA: Diagnosis not present

## 2022-05-10 DIAGNOSIS — M5442 Lumbago with sciatica, left side: Secondary | ICD-10-CM | POA: Diagnosis not present

## 2022-05-10 DIAGNOSIS — I951 Orthostatic hypotension: Secondary | ICD-10-CM | POA: Diagnosis not present

## 2022-05-10 DIAGNOSIS — K219 Gastro-esophageal reflux disease without esophagitis: Secondary | ICD-10-CM | POA: Diagnosis not present

## 2022-05-10 DIAGNOSIS — M542 Cervicalgia: Secondary | ICD-10-CM | POA: Diagnosis not present

## 2022-05-12 DIAGNOSIS — G4700 Insomnia, unspecified: Secondary | ICD-10-CM | POA: Diagnosis not present

## 2022-05-12 DIAGNOSIS — I10 Essential (primary) hypertension: Secondary | ICD-10-CM | POA: Diagnosis not present

## 2022-05-12 DIAGNOSIS — M5442 Lumbago with sciatica, left side: Secondary | ICD-10-CM | POA: Diagnosis not present

## 2022-05-12 DIAGNOSIS — M810 Age-related osteoporosis without current pathological fracture: Secondary | ICD-10-CM | POA: Diagnosis not present

## 2022-05-12 DIAGNOSIS — Z86718 Personal history of other venous thrombosis and embolism: Secondary | ICD-10-CM | POA: Diagnosis not present

## 2022-05-12 DIAGNOSIS — K219 Gastro-esophageal reflux disease without esophagitis: Secondary | ICD-10-CM | POA: Diagnosis not present

## 2022-05-12 DIAGNOSIS — I4891 Unspecified atrial fibrillation: Secondary | ICD-10-CM | POA: Diagnosis not present

## 2022-05-12 DIAGNOSIS — M791 Myalgia, unspecified site: Secondary | ICD-10-CM | POA: Diagnosis not present

## 2022-05-12 DIAGNOSIS — Z87891 Personal history of nicotine dependence: Secondary | ICD-10-CM | POA: Diagnosis not present

## 2022-05-12 DIAGNOSIS — E7800 Pure hypercholesterolemia, unspecified: Secondary | ICD-10-CM | POA: Diagnosis not present

## 2022-05-12 DIAGNOSIS — I951 Orthostatic hypotension: Secondary | ICD-10-CM | POA: Diagnosis not present

## 2022-05-12 DIAGNOSIS — M79606 Pain in leg, unspecified: Secondary | ICD-10-CM | POA: Diagnosis not present

## 2022-05-12 DIAGNOSIS — I739 Peripheral vascular disease, unspecified: Secondary | ICD-10-CM | POA: Diagnosis not present

## 2022-05-12 DIAGNOSIS — I493 Ventricular premature depolarization: Secondary | ICD-10-CM | POA: Diagnosis not present

## 2022-05-12 DIAGNOSIS — M542 Cervicalgia: Secondary | ICD-10-CM | POA: Diagnosis not present

## 2022-05-12 DIAGNOSIS — I491 Atrial premature depolarization: Secondary | ICD-10-CM | POA: Diagnosis not present

## 2022-05-12 DIAGNOSIS — G629 Polyneuropathy, unspecified: Secondary | ICD-10-CM | POA: Diagnosis not present

## 2022-05-12 DIAGNOSIS — M79661 Pain in right lower leg: Secondary | ICD-10-CM | POA: Diagnosis not present

## 2022-05-14 DIAGNOSIS — I739 Peripheral vascular disease, unspecified: Secondary | ICD-10-CM | POA: Diagnosis not present

## 2022-05-14 DIAGNOSIS — M791 Myalgia, unspecified site: Secondary | ICD-10-CM | POA: Diagnosis not present

## 2022-05-14 DIAGNOSIS — I493 Ventricular premature depolarization: Secondary | ICD-10-CM | POA: Diagnosis not present

## 2022-05-14 DIAGNOSIS — Z87891 Personal history of nicotine dependence: Secondary | ICD-10-CM | POA: Diagnosis not present

## 2022-05-14 DIAGNOSIS — G4700 Insomnia, unspecified: Secondary | ICD-10-CM | POA: Diagnosis not present

## 2022-05-14 DIAGNOSIS — I951 Orthostatic hypotension: Secondary | ICD-10-CM | POA: Diagnosis not present

## 2022-05-14 DIAGNOSIS — M79606 Pain in leg, unspecified: Secondary | ICD-10-CM | POA: Diagnosis not present

## 2022-05-14 DIAGNOSIS — G629 Polyneuropathy, unspecified: Secondary | ICD-10-CM | POA: Diagnosis not present

## 2022-05-14 DIAGNOSIS — M810 Age-related osteoporosis without current pathological fracture: Secondary | ICD-10-CM | POA: Diagnosis not present

## 2022-05-14 DIAGNOSIS — M5442 Lumbago with sciatica, left side: Secondary | ICD-10-CM | POA: Diagnosis not present

## 2022-05-14 DIAGNOSIS — I4891 Unspecified atrial fibrillation: Secondary | ICD-10-CM | POA: Diagnosis not present

## 2022-05-14 DIAGNOSIS — M79661 Pain in right lower leg: Secondary | ICD-10-CM | POA: Diagnosis not present

## 2022-05-14 DIAGNOSIS — Z86718 Personal history of other venous thrombosis and embolism: Secondary | ICD-10-CM | POA: Diagnosis not present

## 2022-05-14 DIAGNOSIS — E7800 Pure hypercholesterolemia, unspecified: Secondary | ICD-10-CM | POA: Diagnosis not present

## 2022-05-14 DIAGNOSIS — I10 Essential (primary) hypertension: Secondary | ICD-10-CM | POA: Diagnosis not present

## 2022-05-14 DIAGNOSIS — M542 Cervicalgia: Secondary | ICD-10-CM | POA: Diagnosis not present

## 2022-05-14 DIAGNOSIS — K219 Gastro-esophageal reflux disease without esophagitis: Secondary | ICD-10-CM | POA: Diagnosis not present

## 2022-05-14 DIAGNOSIS — I491 Atrial premature depolarization: Secondary | ICD-10-CM | POA: Diagnosis not present

## 2022-05-17 DIAGNOSIS — M79661 Pain in right lower leg: Secondary | ICD-10-CM | POA: Diagnosis not present

## 2022-05-17 DIAGNOSIS — Z87891 Personal history of nicotine dependence: Secondary | ICD-10-CM | POA: Diagnosis not present

## 2022-05-17 DIAGNOSIS — E7800 Pure hypercholesterolemia, unspecified: Secondary | ICD-10-CM | POA: Diagnosis not present

## 2022-05-17 DIAGNOSIS — Z86718 Personal history of other venous thrombosis and embolism: Secondary | ICD-10-CM | POA: Diagnosis not present

## 2022-05-17 DIAGNOSIS — I491 Atrial premature depolarization: Secondary | ICD-10-CM | POA: Diagnosis not present

## 2022-05-17 DIAGNOSIS — I493 Ventricular premature depolarization: Secondary | ICD-10-CM | POA: Diagnosis not present

## 2022-05-17 DIAGNOSIS — I951 Orthostatic hypotension: Secondary | ICD-10-CM | POA: Diagnosis not present

## 2022-05-17 DIAGNOSIS — M5442 Lumbago with sciatica, left side: Secondary | ICD-10-CM | POA: Diagnosis not present

## 2022-05-17 DIAGNOSIS — I4891 Unspecified atrial fibrillation: Secondary | ICD-10-CM | POA: Diagnosis not present

## 2022-05-17 DIAGNOSIS — I10 Essential (primary) hypertension: Secondary | ICD-10-CM | POA: Diagnosis not present

## 2022-05-17 DIAGNOSIS — M791 Myalgia, unspecified site: Secondary | ICD-10-CM | POA: Diagnosis not present

## 2022-05-17 DIAGNOSIS — G629 Polyneuropathy, unspecified: Secondary | ICD-10-CM | POA: Diagnosis not present

## 2022-05-17 DIAGNOSIS — M810 Age-related osteoporosis without current pathological fracture: Secondary | ICD-10-CM | POA: Diagnosis not present

## 2022-05-17 DIAGNOSIS — G4700 Insomnia, unspecified: Secondary | ICD-10-CM | POA: Diagnosis not present

## 2022-05-17 DIAGNOSIS — M542 Cervicalgia: Secondary | ICD-10-CM | POA: Diagnosis not present

## 2022-05-17 DIAGNOSIS — M79606 Pain in leg, unspecified: Secondary | ICD-10-CM | POA: Diagnosis not present

## 2022-05-17 DIAGNOSIS — K219 Gastro-esophageal reflux disease without esophagitis: Secondary | ICD-10-CM | POA: Diagnosis not present

## 2022-05-17 DIAGNOSIS — I739 Peripheral vascular disease, unspecified: Secondary | ICD-10-CM | POA: Diagnosis not present

## 2022-05-19 DIAGNOSIS — M5442 Lumbago with sciatica, left side: Secondary | ICD-10-CM | POA: Diagnosis not present

## 2022-05-19 DIAGNOSIS — I10 Essential (primary) hypertension: Secondary | ICD-10-CM | POA: Diagnosis not present

## 2022-05-19 DIAGNOSIS — M542 Cervicalgia: Secondary | ICD-10-CM | POA: Diagnosis not present

## 2022-05-19 DIAGNOSIS — I951 Orthostatic hypotension: Secondary | ICD-10-CM | POA: Diagnosis not present

## 2022-05-19 DIAGNOSIS — G629 Polyneuropathy, unspecified: Secondary | ICD-10-CM | POA: Diagnosis not present

## 2022-05-19 DIAGNOSIS — I493 Ventricular premature depolarization: Secondary | ICD-10-CM | POA: Diagnosis not present

## 2022-05-19 DIAGNOSIS — I4891 Unspecified atrial fibrillation: Secondary | ICD-10-CM | POA: Diagnosis not present

## 2022-05-19 DIAGNOSIS — I739 Peripheral vascular disease, unspecified: Secondary | ICD-10-CM | POA: Diagnosis not present

## 2022-05-19 DIAGNOSIS — E7800 Pure hypercholesterolemia, unspecified: Secondary | ICD-10-CM | POA: Diagnosis not present

## 2022-05-19 DIAGNOSIS — M810 Age-related osteoporosis without current pathological fracture: Secondary | ICD-10-CM | POA: Diagnosis not present

## 2022-05-19 DIAGNOSIS — Z87891 Personal history of nicotine dependence: Secondary | ICD-10-CM | POA: Diagnosis not present

## 2022-05-19 DIAGNOSIS — M791 Myalgia, unspecified site: Secondary | ICD-10-CM | POA: Diagnosis not present

## 2022-05-19 DIAGNOSIS — M79661 Pain in right lower leg: Secondary | ICD-10-CM | POA: Diagnosis not present

## 2022-05-19 DIAGNOSIS — Z86718 Personal history of other venous thrombosis and embolism: Secondary | ICD-10-CM | POA: Diagnosis not present

## 2022-05-19 DIAGNOSIS — G4700 Insomnia, unspecified: Secondary | ICD-10-CM | POA: Diagnosis not present

## 2022-05-19 DIAGNOSIS — K219 Gastro-esophageal reflux disease without esophagitis: Secondary | ICD-10-CM | POA: Diagnosis not present

## 2022-05-19 DIAGNOSIS — M79606 Pain in leg, unspecified: Secondary | ICD-10-CM | POA: Diagnosis not present

## 2022-05-19 DIAGNOSIS — I491 Atrial premature depolarization: Secondary | ICD-10-CM | POA: Diagnosis not present

## 2022-05-21 DIAGNOSIS — I4891 Unspecified atrial fibrillation: Secondary | ICD-10-CM | POA: Diagnosis not present

## 2022-05-21 DIAGNOSIS — I10 Essential (primary) hypertension: Secondary | ICD-10-CM | POA: Diagnosis not present

## 2022-05-21 DIAGNOSIS — I493 Ventricular premature depolarization: Secondary | ICD-10-CM | POA: Diagnosis not present

## 2022-05-21 DIAGNOSIS — M810 Age-related osteoporosis without current pathological fracture: Secondary | ICD-10-CM | POA: Diagnosis not present

## 2022-05-21 DIAGNOSIS — M79606 Pain in leg, unspecified: Secondary | ICD-10-CM | POA: Diagnosis not present

## 2022-05-21 DIAGNOSIS — I491 Atrial premature depolarization: Secondary | ICD-10-CM | POA: Diagnosis not present

## 2022-05-21 DIAGNOSIS — I951 Orthostatic hypotension: Secondary | ICD-10-CM | POA: Diagnosis not present

## 2022-05-21 DIAGNOSIS — Z86718 Personal history of other venous thrombosis and embolism: Secondary | ICD-10-CM | POA: Diagnosis not present

## 2022-05-21 DIAGNOSIS — G4700 Insomnia, unspecified: Secondary | ICD-10-CM | POA: Diagnosis not present

## 2022-05-21 DIAGNOSIS — E7800 Pure hypercholesterolemia, unspecified: Secondary | ICD-10-CM | POA: Diagnosis not present

## 2022-05-21 DIAGNOSIS — I739 Peripheral vascular disease, unspecified: Secondary | ICD-10-CM | POA: Diagnosis not present

## 2022-05-21 DIAGNOSIS — K219 Gastro-esophageal reflux disease without esophagitis: Secondary | ICD-10-CM | POA: Diagnosis not present

## 2022-05-21 DIAGNOSIS — Z87891 Personal history of nicotine dependence: Secondary | ICD-10-CM | POA: Diagnosis not present

## 2022-05-21 DIAGNOSIS — G629 Polyneuropathy, unspecified: Secondary | ICD-10-CM | POA: Diagnosis not present

## 2022-05-21 DIAGNOSIS — M5442 Lumbago with sciatica, left side: Secondary | ICD-10-CM | POA: Diagnosis not present

## 2022-05-21 DIAGNOSIS — M542 Cervicalgia: Secondary | ICD-10-CM | POA: Diagnosis not present

## 2022-05-21 DIAGNOSIS — M791 Myalgia, unspecified site: Secondary | ICD-10-CM | POA: Diagnosis not present

## 2022-05-21 DIAGNOSIS — M79661 Pain in right lower leg: Secondary | ICD-10-CM | POA: Diagnosis not present

## 2022-05-26 DIAGNOSIS — M810 Age-related osteoporosis without current pathological fracture: Secondary | ICD-10-CM | POA: Diagnosis not present

## 2022-05-26 DIAGNOSIS — K219 Gastro-esophageal reflux disease without esophagitis: Secondary | ICD-10-CM | POA: Diagnosis not present

## 2022-05-26 DIAGNOSIS — M79661 Pain in right lower leg: Secondary | ICD-10-CM | POA: Diagnosis not present

## 2022-05-26 DIAGNOSIS — Z86718 Personal history of other venous thrombosis and embolism: Secondary | ICD-10-CM | POA: Diagnosis not present

## 2022-05-26 DIAGNOSIS — Z87891 Personal history of nicotine dependence: Secondary | ICD-10-CM | POA: Diagnosis not present

## 2022-05-26 DIAGNOSIS — M542 Cervicalgia: Secondary | ICD-10-CM | POA: Diagnosis not present

## 2022-05-26 DIAGNOSIS — I951 Orthostatic hypotension: Secondary | ICD-10-CM | POA: Diagnosis not present

## 2022-05-26 DIAGNOSIS — I491 Atrial premature depolarization: Secondary | ICD-10-CM | POA: Diagnosis not present

## 2022-05-26 DIAGNOSIS — I739 Peripheral vascular disease, unspecified: Secondary | ICD-10-CM | POA: Diagnosis not present

## 2022-05-26 DIAGNOSIS — E7800 Pure hypercholesterolemia, unspecified: Secondary | ICD-10-CM | POA: Diagnosis not present

## 2022-05-26 DIAGNOSIS — M791 Myalgia, unspecified site: Secondary | ICD-10-CM | POA: Diagnosis not present

## 2022-05-26 DIAGNOSIS — G629 Polyneuropathy, unspecified: Secondary | ICD-10-CM | POA: Diagnosis not present

## 2022-05-26 DIAGNOSIS — I10 Essential (primary) hypertension: Secondary | ICD-10-CM | POA: Diagnosis not present

## 2022-05-26 DIAGNOSIS — G4700 Insomnia, unspecified: Secondary | ICD-10-CM | POA: Diagnosis not present

## 2022-05-26 DIAGNOSIS — M5442 Lumbago with sciatica, left side: Secondary | ICD-10-CM | POA: Diagnosis not present

## 2022-05-26 DIAGNOSIS — I4891 Unspecified atrial fibrillation: Secondary | ICD-10-CM | POA: Diagnosis not present

## 2022-05-26 DIAGNOSIS — I493 Ventricular premature depolarization: Secondary | ICD-10-CM | POA: Diagnosis not present

## 2022-05-26 DIAGNOSIS — M79606 Pain in leg, unspecified: Secondary | ICD-10-CM | POA: Diagnosis not present

## 2022-05-28 DIAGNOSIS — K219 Gastro-esophageal reflux disease without esophagitis: Secondary | ICD-10-CM | POA: Diagnosis not present

## 2022-05-28 DIAGNOSIS — M79661 Pain in right lower leg: Secondary | ICD-10-CM | POA: Diagnosis not present

## 2022-05-28 DIAGNOSIS — I951 Orthostatic hypotension: Secondary | ICD-10-CM | POA: Diagnosis not present

## 2022-05-28 DIAGNOSIS — M542 Cervicalgia: Secondary | ICD-10-CM | POA: Diagnosis not present

## 2022-05-28 DIAGNOSIS — I493 Ventricular premature depolarization: Secondary | ICD-10-CM | POA: Diagnosis not present

## 2022-05-28 DIAGNOSIS — I491 Atrial premature depolarization: Secondary | ICD-10-CM | POA: Diagnosis not present

## 2022-05-28 DIAGNOSIS — M5442 Lumbago with sciatica, left side: Secondary | ICD-10-CM | POA: Diagnosis not present

## 2022-05-28 DIAGNOSIS — G629 Polyneuropathy, unspecified: Secondary | ICD-10-CM | POA: Diagnosis not present

## 2022-05-28 DIAGNOSIS — Z86718 Personal history of other venous thrombosis and embolism: Secondary | ICD-10-CM | POA: Diagnosis not present

## 2022-05-28 DIAGNOSIS — I10 Essential (primary) hypertension: Secondary | ICD-10-CM | POA: Diagnosis not present

## 2022-05-28 DIAGNOSIS — E7800 Pure hypercholesterolemia, unspecified: Secondary | ICD-10-CM | POA: Diagnosis not present

## 2022-05-28 DIAGNOSIS — Z87891 Personal history of nicotine dependence: Secondary | ICD-10-CM | POA: Diagnosis not present

## 2022-05-28 DIAGNOSIS — G4700 Insomnia, unspecified: Secondary | ICD-10-CM | POA: Diagnosis not present

## 2022-05-28 DIAGNOSIS — I4891 Unspecified atrial fibrillation: Secondary | ICD-10-CM | POA: Diagnosis not present

## 2022-05-28 DIAGNOSIS — M79606 Pain in leg, unspecified: Secondary | ICD-10-CM | POA: Diagnosis not present

## 2022-05-28 DIAGNOSIS — M810 Age-related osteoporosis without current pathological fracture: Secondary | ICD-10-CM | POA: Diagnosis not present

## 2022-05-28 DIAGNOSIS — M791 Myalgia, unspecified site: Secondary | ICD-10-CM | POA: Diagnosis not present

## 2022-05-28 DIAGNOSIS — I739 Peripheral vascular disease, unspecified: Secondary | ICD-10-CM | POA: Diagnosis not present

## 2022-06-03 DIAGNOSIS — M810 Age-related osteoporosis without current pathological fracture: Secondary | ICD-10-CM | POA: Diagnosis not present

## 2022-06-03 DIAGNOSIS — M5442 Lumbago with sciatica, left side: Secondary | ICD-10-CM | POA: Diagnosis not present

## 2022-06-03 DIAGNOSIS — G629 Polyneuropathy, unspecified: Secondary | ICD-10-CM | POA: Diagnosis not present

## 2022-06-03 DIAGNOSIS — M542 Cervicalgia: Secondary | ICD-10-CM | POA: Diagnosis not present

## 2022-06-03 DIAGNOSIS — I951 Orthostatic hypotension: Secondary | ICD-10-CM | POA: Diagnosis not present

## 2022-06-03 DIAGNOSIS — M79661 Pain in right lower leg: Secondary | ICD-10-CM | POA: Diagnosis not present

## 2022-06-03 DIAGNOSIS — I493 Ventricular premature depolarization: Secondary | ICD-10-CM | POA: Diagnosis not present

## 2022-06-03 DIAGNOSIS — Z86718 Personal history of other venous thrombosis and embolism: Secondary | ICD-10-CM | POA: Diagnosis not present

## 2022-06-03 DIAGNOSIS — G4700 Insomnia, unspecified: Secondary | ICD-10-CM | POA: Diagnosis not present

## 2022-06-03 DIAGNOSIS — E7800 Pure hypercholesterolemia, unspecified: Secondary | ICD-10-CM | POA: Diagnosis not present

## 2022-06-03 DIAGNOSIS — Z87891 Personal history of nicotine dependence: Secondary | ICD-10-CM | POA: Diagnosis not present

## 2022-06-03 DIAGNOSIS — K219 Gastro-esophageal reflux disease without esophagitis: Secondary | ICD-10-CM | POA: Diagnosis not present

## 2022-06-03 DIAGNOSIS — I10 Essential (primary) hypertension: Secondary | ICD-10-CM | POA: Diagnosis not present

## 2022-06-03 DIAGNOSIS — I491 Atrial premature depolarization: Secondary | ICD-10-CM | POA: Diagnosis not present

## 2022-06-03 DIAGNOSIS — M791 Myalgia, unspecified site: Secondary | ICD-10-CM | POA: Diagnosis not present

## 2022-06-03 DIAGNOSIS — I4891 Unspecified atrial fibrillation: Secondary | ICD-10-CM | POA: Diagnosis not present

## 2022-06-03 DIAGNOSIS — M79606 Pain in leg, unspecified: Secondary | ICD-10-CM | POA: Diagnosis not present

## 2022-06-03 DIAGNOSIS — I739 Peripheral vascular disease, unspecified: Secondary | ICD-10-CM | POA: Diagnosis not present

## 2022-06-08 DIAGNOSIS — M79606 Pain in leg, unspecified: Secondary | ICD-10-CM | POA: Diagnosis not present

## 2022-06-08 DIAGNOSIS — I491 Atrial premature depolarization: Secondary | ICD-10-CM | POA: Diagnosis not present

## 2022-06-08 DIAGNOSIS — I739 Peripheral vascular disease, unspecified: Secondary | ICD-10-CM | POA: Diagnosis not present

## 2022-06-08 DIAGNOSIS — G4700 Insomnia, unspecified: Secondary | ICD-10-CM | POA: Diagnosis not present

## 2022-06-08 DIAGNOSIS — E7800 Pure hypercholesterolemia, unspecified: Secondary | ICD-10-CM | POA: Diagnosis not present

## 2022-06-08 DIAGNOSIS — I4891 Unspecified atrial fibrillation: Secondary | ICD-10-CM | POA: Diagnosis not present

## 2022-06-08 DIAGNOSIS — I493 Ventricular premature depolarization: Secondary | ICD-10-CM | POA: Diagnosis not present

## 2022-06-08 DIAGNOSIS — M542 Cervicalgia: Secondary | ICD-10-CM | POA: Diagnosis not present

## 2022-06-08 DIAGNOSIS — Z86718 Personal history of other venous thrombosis and embolism: Secondary | ICD-10-CM | POA: Diagnosis not present

## 2022-06-08 DIAGNOSIS — G629 Polyneuropathy, unspecified: Secondary | ICD-10-CM | POA: Diagnosis not present

## 2022-06-08 DIAGNOSIS — I951 Orthostatic hypotension: Secondary | ICD-10-CM | POA: Diagnosis not present

## 2022-06-08 DIAGNOSIS — M5442 Lumbago with sciatica, left side: Secondary | ICD-10-CM | POA: Diagnosis not present

## 2022-06-08 DIAGNOSIS — Z87891 Personal history of nicotine dependence: Secondary | ICD-10-CM | POA: Diagnosis not present

## 2022-06-08 DIAGNOSIS — M810 Age-related osteoporosis without current pathological fracture: Secondary | ICD-10-CM | POA: Diagnosis not present

## 2022-06-08 DIAGNOSIS — M791 Myalgia, unspecified site: Secondary | ICD-10-CM | POA: Diagnosis not present

## 2022-06-08 DIAGNOSIS — I10 Essential (primary) hypertension: Secondary | ICD-10-CM | POA: Diagnosis not present

## 2022-06-08 DIAGNOSIS — K219 Gastro-esophageal reflux disease without esophagitis: Secondary | ICD-10-CM | POA: Diagnosis not present

## 2022-06-08 DIAGNOSIS — M79661 Pain in right lower leg: Secondary | ICD-10-CM | POA: Diagnosis not present

## 2022-06-16 DIAGNOSIS — M79606 Pain in leg, unspecified: Secondary | ICD-10-CM | POA: Diagnosis not present

## 2022-06-16 DIAGNOSIS — I493 Ventricular premature depolarization: Secondary | ICD-10-CM | POA: Diagnosis not present

## 2022-06-16 DIAGNOSIS — K219 Gastro-esophageal reflux disease without esophagitis: Secondary | ICD-10-CM | POA: Diagnosis not present

## 2022-06-16 DIAGNOSIS — Z86718 Personal history of other venous thrombosis and embolism: Secondary | ICD-10-CM | POA: Diagnosis not present

## 2022-06-16 DIAGNOSIS — I10 Essential (primary) hypertension: Secondary | ICD-10-CM | POA: Diagnosis not present

## 2022-06-16 DIAGNOSIS — I491 Atrial premature depolarization: Secondary | ICD-10-CM | POA: Diagnosis not present

## 2022-06-16 DIAGNOSIS — I951 Orthostatic hypotension: Secondary | ICD-10-CM | POA: Diagnosis not present

## 2022-06-16 DIAGNOSIS — I4891 Unspecified atrial fibrillation: Secondary | ICD-10-CM | POA: Diagnosis not present

## 2022-06-16 DIAGNOSIS — G4700 Insomnia, unspecified: Secondary | ICD-10-CM | POA: Diagnosis not present

## 2022-06-16 DIAGNOSIS — M791 Myalgia, unspecified site: Secondary | ICD-10-CM | POA: Diagnosis not present

## 2022-06-16 DIAGNOSIS — M542 Cervicalgia: Secondary | ICD-10-CM | POA: Diagnosis not present

## 2022-06-16 DIAGNOSIS — E7800 Pure hypercholesterolemia, unspecified: Secondary | ICD-10-CM | POA: Diagnosis not present

## 2022-06-16 DIAGNOSIS — I739 Peripheral vascular disease, unspecified: Secondary | ICD-10-CM | POA: Diagnosis not present

## 2022-06-16 DIAGNOSIS — G629 Polyneuropathy, unspecified: Secondary | ICD-10-CM | POA: Diagnosis not present

## 2022-06-16 DIAGNOSIS — Z87891 Personal history of nicotine dependence: Secondary | ICD-10-CM | POA: Diagnosis not present

## 2022-06-16 DIAGNOSIS — M5442 Lumbago with sciatica, left side: Secondary | ICD-10-CM | POA: Diagnosis not present

## 2022-06-16 DIAGNOSIS — M79661 Pain in right lower leg: Secondary | ICD-10-CM | POA: Diagnosis not present

## 2022-06-16 DIAGNOSIS — M810 Age-related osteoporosis without current pathological fracture: Secondary | ICD-10-CM | POA: Diagnosis not present

## 2022-06-23 DIAGNOSIS — Z713 Dietary counseling and surveillance: Secondary | ICD-10-CM | POA: Diagnosis not present

## 2022-06-23 DIAGNOSIS — N390 Urinary tract infection, site not specified: Secondary | ICD-10-CM | POA: Diagnosis not present

## 2022-06-23 DIAGNOSIS — Z299 Encounter for prophylactic measures, unspecified: Secondary | ICD-10-CM | POA: Diagnosis not present

## 2022-06-23 DIAGNOSIS — I4891 Unspecified atrial fibrillation: Secondary | ICD-10-CM | POA: Diagnosis not present

## 2022-07-13 DIAGNOSIS — Z299 Encounter for prophylactic measures, unspecified: Secondary | ICD-10-CM | POA: Diagnosis not present

## 2022-07-13 DIAGNOSIS — Z7189 Other specified counseling: Secondary | ICD-10-CM | POA: Diagnosis not present

## 2022-07-13 DIAGNOSIS — I739 Peripheral vascular disease, unspecified: Secondary | ICD-10-CM | POA: Diagnosis not present

## 2022-07-13 DIAGNOSIS — Z0000 Encounter for general adult medical examination without abnormal findings: Secondary | ICD-10-CM | POA: Diagnosis not present

## 2022-07-13 DIAGNOSIS — I10 Essential (primary) hypertension: Secondary | ICD-10-CM | POA: Diagnosis not present

## 2022-07-13 DIAGNOSIS — R350 Frequency of micturition: Secondary | ICD-10-CM | POA: Diagnosis not present

## 2022-08-02 DIAGNOSIS — I480 Paroxysmal atrial fibrillation: Secondary | ICD-10-CM | POA: Diagnosis not present

## 2022-08-02 DIAGNOSIS — I10 Essential (primary) hypertension: Secondary | ICD-10-CM | POA: Diagnosis not present

## 2022-09-02 DIAGNOSIS — R350 Frequency of micturition: Secondary | ICD-10-CM | POA: Diagnosis not present

## 2022-09-02 DIAGNOSIS — I10 Essential (primary) hypertension: Secondary | ICD-10-CM | POA: Diagnosis not present

## 2022-09-02 DIAGNOSIS — N390 Urinary tract infection, site not specified: Secondary | ICD-10-CM | POA: Diagnosis not present

## 2022-09-02 DIAGNOSIS — Z2821 Immunization not carried out because of patient refusal: Secondary | ICD-10-CM | POA: Diagnosis not present

## 2022-09-02 DIAGNOSIS — Z299 Encounter for prophylactic measures, unspecified: Secondary | ICD-10-CM | POA: Diagnosis not present

## 2022-09-02 DIAGNOSIS — Z789 Other specified health status: Secondary | ICD-10-CM | POA: Diagnosis not present

## 2022-09-13 DIAGNOSIS — H524 Presbyopia: Secondary | ICD-10-CM | POA: Diagnosis not present

## 2022-09-13 DIAGNOSIS — H40023 Open angle with borderline findings, high risk, bilateral: Secondary | ICD-10-CM | POA: Diagnosis not present

## 2022-12-09 DIAGNOSIS — I4891 Unspecified atrial fibrillation: Secondary | ICD-10-CM | POA: Diagnosis not present

## 2022-12-09 DIAGNOSIS — I10 Essential (primary) hypertension: Secondary | ICD-10-CM | POA: Diagnosis not present

## 2022-12-10 DIAGNOSIS — R350 Frequency of micturition: Secondary | ICD-10-CM | POA: Diagnosis not present

## 2023-02-01 DIAGNOSIS — I480 Paroxysmal atrial fibrillation: Secondary | ICD-10-CM | POA: Diagnosis not present

## 2023-02-01 DIAGNOSIS — I10 Essential (primary) hypertension: Secondary | ICD-10-CM | POA: Diagnosis not present

## 2023-03-03 DIAGNOSIS — I081 Rheumatic disorders of both mitral and tricuspid valves: Secondary | ICD-10-CM | POA: Diagnosis not present

## 2023-03-03 DIAGNOSIS — I480 Paroxysmal atrial fibrillation: Secondary | ICD-10-CM | POA: Diagnosis not present

## 2023-03-03 DIAGNOSIS — I10 Essential (primary) hypertension: Secondary | ICD-10-CM | POA: Diagnosis not present

## 2023-03-03 DIAGNOSIS — I083 Combined rheumatic disorders of mitral, aortic and tricuspid valves: Secondary | ICD-10-CM | POA: Diagnosis not present

## 2023-03-08 DIAGNOSIS — Z7189 Other specified counseling: Secondary | ICD-10-CM | POA: Diagnosis not present

## 2023-03-08 DIAGNOSIS — Z0000 Encounter for general adult medical examination without abnormal findings: Secondary | ICD-10-CM | POA: Diagnosis not present

## 2023-03-08 DIAGNOSIS — I10 Essential (primary) hypertension: Secondary | ICD-10-CM | POA: Diagnosis not present

## 2023-03-08 DIAGNOSIS — Z299 Encounter for prophylactic measures, unspecified: Secondary | ICD-10-CM | POA: Diagnosis not present

## 2023-03-08 DIAGNOSIS — I700 Atherosclerosis of aorta: Secondary | ICD-10-CM | POA: Diagnosis not present

## 2023-03-08 DIAGNOSIS — I739 Peripheral vascular disease, unspecified: Secondary | ICD-10-CM | POA: Diagnosis not present

## 2023-03-29 DIAGNOSIS — K219 Gastro-esophageal reflux disease without esophagitis: Secondary | ICD-10-CM | POA: Diagnosis not present

## 2023-03-29 DIAGNOSIS — J069 Acute upper respiratory infection, unspecified: Secondary | ICD-10-CM | POA: Diagnosis not present

## 2023-06-13 DIAGNOSIS — Z0000 Encounter for general adult medical examination without abnormal findings: Secondary | ICD-10-CM | POA: Diagnosis not present

## 2023-06-13 DIAGNOSIS — Z299 Encounter for prophylactic measures, unspecified: Secondary | ICD-10-CM | POA: Diagnosis not present

## 2023-06-13 DIAGNOSIS — I700 Atherosclerosis of aorta: Secondary | ICD-10-CM | POA: Diagnosis not present

## 2023-06-13 DIAGNOSIS — I739 Peripheral vascular disease, unspecified: Secondary | ICD-10-CM | POA: Diagnosis not present

## 2023-06-13 DIAGNOSIS — I10 Essential (primary) hypertension: Secondary | ICD-10-CM | POA: Diagnosis not present

## 2023-06-20 DIAGNOSIS — I10 Essential (primary) hypertension: Secondary | ICD-10-CM | POA: Diagnosis not present

## 2023-06-20 DIAGNOSIS — I480 Paroxysmal atrial fibrillation: Secondary | ICD-10-CM | POA: Diagnosis not present

## 2023-06-27 DIAGNOSIS — S93402A Sprain of unspecified ligament of left ankle, initial encounter: Secondary | ICD-10-CM | POA: Diagnosis not present

## 2023-06-27 DIAGNOSIS — S82892A Other fracture of left lower leg, initial encounter for closed fracture: Secondary | ICD-10-CM | POA: Diagnosis not present

## 2023-07-18 DIAGNOSIS — Z6835 Body mass index (BMI) 35.0-35.9, adult: Secondary | ICD-10-CM | POA: Diagnosis not present

## 2023-07-18 DIAGNOSIS — I4891 Unspecified atrial fibrillation: Secondary | ICD-10-CM | POA: Diagnosis not present

## 2023-07-18 DIAGNOSIS — K219 Gastro-esophageal reflux disease without esophagitis: Secondary | ICD-10-CM | POA: Diagnosis not present

## 2023-07-18 DIAGNOSIS — I739 Peripheral vascular disease, unspecified: Secondary | ICD-10-CM | POA: Diagnosis not present

## 2023-07-18 DIAGNOSIS — I700 Atherosclerosis of aorta: Secondary | ICD-10-CM | POA: Diagnosis not present

## 2023-07-18 DIAGNOSIS — E7800 Pure hypercholesterolemia, unspecified: Secondary | ICD-10-CM | POA: Diagnosis not present

## 2023-07-18 DIAGNOSIS — M1909 Primary osteoarthritis, other specified site: Secondary | ICD-10-CM | POA: Diagnosis not present

## 2023-07-18 DIAGNOSIS — I10 Essential (primary) hypertension: Secondary | ICD-10-CM | POA: Diagnosis not present

## 2023-07-22 DIAGNOSIS — R519 Headache, unspecified: Secondary | ICD-10-CM | POA: Diagnosis not present

## 2023-07-22 DIAGNOSIS — Z299 Encounter for prophylactic measures, unspecified: Secondary | ICD-10-CM | POA: Diagnosis not present

## 2023-07-22 DIAGNOSIS — I10 Essential (primary) hypertension: Secondary | ICD-10-CM | POA: Diagnosis not present

## 2023-07-23 DIAGNOSIS — I10 Essential (primary) hypertension: Secondary | ICD-10-CM | POA: Diagnosis not present

## 2023-07-27 DIAGNOSIS — Z6835 Body mass index (BMI) 35.0-35.9, adult: Secondary | ICD-10-CM | POA: Diagnosis not present

## 2023-07-27 DIAGNOSIS — I700 Atherosclerosis of aorta: Secondary | ICD-10-CM | POA: Diagnosis not present

## 2023-07-27 DIAGNOSIS — I739 Peripheral vascular disease, unspecified: Secondary | ICD-10-CM | POA: Diagnosis not present

## 2023-07-27 DIAGNOSIS — I4891 Unspecified atrial fibrillation: Secondary | ICD-10-CM | POA: Diagnosis not present

## 2023-07-27 DIAGNOSIS — K219 Gastro-esophageal reflux disease without esophagitis: Secondary | ICD-10-CM | POA: Diagnosis not present

## 2023-07-27 DIAGNOSIS — I10 Essential (primary) hypertension: Secondary | ICD-10-CM | POA: Diagnosis not present

## 2023-07-27 DIAGNOSIS — M1909 Primary osteoarthritis, other specified site: Secondary | ICD-10-CM | POA: Diagnosis not present

## 2023-07-27 DIAGNOSIS — E7800 Pure hypercholesterolemia, unspecified: Secondary | ICD-10-CM | POA: Diagnosis not present

## 2023-07-28 DIAGNOSIS — I700 Atherosclerosis of aorta: Secondary | ICD-10-CM | POA: Diagnosis not present

## 2023-07-28 DIAGNOSIS — K219 Gastro-esophageal reflux disease without esophagitis: Secondary | ICD-10-CM | POA: Diagnosis not present

## 2023-07-28 DIAGNOSIS — Z6835 Body mass index (BMI) 35.0-35.9, adult: Secondary | ICD-10-CM | POA: Diagnosis not present

## 2023-07-28 DIAGNOSIS — E7800 Pure hypercholesterolemia, unspecified: Secondary | ICD-10-CM | POA: Diagnosis not present

## 2023-07-28 DIAGNOSIS — I739 Peripheral vascular disease, unspecified: Secondary | ICD-10-CM | POA: Diagnosis not present

## 2023-07-28 DIAGNOSIS — I4891 Unspecified atrial fibrillation: Secondary | ICD-10-CM | POA: Diagnosis not present

## 2023-07-28 DIAGNOSIS — I10 Essential (primary) hypertension: Secondary | ICD-10-CM | POA: Diagnosis not present

## 2023-07-28 DIAGNOSIS — M1909 Primary osteoarthritis, other specified site: Secondary | ICD-10-CM | POA: Diagnosis not present

## 2023-08-01 DIAGNOSIS — K219 Gastro-esophageal reflux disease without esophagitis: Secondary | ICD-10-CM | POA: Diagnosis not present

## 2023-08-01 DIAGNOSIS — I10 Essential (primary) hypertension: Secondary | ICD-10-CM | POA: Diagnosis not present

## 2023-08-01 DIAGNOSIS — I700 Atherosclerosis of aorta: Secondary | ICD-10-CM | POA: Diagnosis not present

## 2023-08-01 DIAGNOSIS — E7800 Pure hypercholesterolemia, unspecified: Secondary | ICD-10-CM | POA: Diagnosis not present

## 2023-08-01 DIAGNOSIS — I739 Peripheral vascular disease, unspecified: Secondary | ICD-10-CM | POA: Diagnosis not present

## 2023-08-01 DIAGNOSIS — Z6835 Body mass index (BMI) 35.0-35.9, adult: Secondary | ICD-10-CM | POA: Diagnosis not present

## 2023-08-01 DIAGNOSIS — I4891 Unspecified atrial fibrillation: Secondary | ICD-10-CM | POA: Diagnosis not present

## 2023-08-01 DIAGNOSIS — M1909 Primary osteoarthritis, other specified site: Secondary | ICD-10-CM | POA: Diagnosis not present

## 2023-08-04 DIAGNOSIS — I700 Atherosclerosis of aorta: Secondary | ICD-10-CM | POA: Diagnosis not present

## 2023-08-04 DIAGNOSIS — Z6835 Body mass index (BMI) 35.0-35.9, adult: Secondary | ICD-10-CM | POA: Diagnosis not present

## 2023-08-04 DIAGNOSIS — I4891 Unspecified atrial fibrillation: Secondary | ICD-10-CM | POA: Diagnosis not present

## 2023-08-04 DIAGNOSIS — I10 Essential (primary) hypertension: Secondary | ICD-10-CM | POA: Diagnosis not present

## 2023-08-04 DIAGNOSIS — M1909 Primary osteoarthritis, other specified site: Secondary | ICD-10-CM | POA: Diagnosis not present

## 2023-08-04 DIAGNOSIS — I739 Peripheral vascular disease, unspecified: Secondary | ICD-10-CM | POA: Diagnosis not present

## 2023-08-04 DIAGNOSIS — K219 Gastro-esophageal reflux disease without esophagitis: Secondary | ICD-10-CM | POA: Diagnosis not present

## 2023-08-04 DIAGNOSIS — E7800 Pure hypercholesterolemia, unspecified: Secondary | ICD-10-CM | POA: Diagnosis not present

## 2023-08-08 DIAGNOSIS — I10 Essential (primary) hypertension: Secondary | ICD-10-CM | POA: Diagnosis not present

## 2023-08-08 DIAGNOSIS — I700 Atherosclerosis of aorta: Secondary | ICD-10-CM | POA: Diagnosis not present

## 2023-08-08 DIAGNOSIS — I739 Peripheral vascular disease, unspecified: Secondary | ICD-10-CM | POA: Diagnosis not present

## 2023-08-08 DIAGNOSIS — E7800 Pure hypercholesterolemia, unspecified: Secondary | ICD-10-CM | POA: Diagnosis not present

## 2023-08-08 DIAGNOSIS — Z6835 Body mass index (BMI) 35.0-35.9, adult: Secondary | ICD-10-CM | POA: Diagnosis not present

## 2023-08-08 DIAGNOSIS — K219 Gastro-esophageal reflux disease without esophagitis: Secondary | ICD-10-CM | POA: Diagnosis not present

## 2023-08-08 DIAGNOSIS — M1909 Primary osteoarthritis, other specified site: Secondary | ICD-10-CM | POA: Diagnosis not present

## 2023-08-08 DIAGNOSIS — I4891 Unspecified atrial fibrillation: Secondary | ICD-10-CM | POA: Diagnosis not present

## 2023-08-11 DIAGNOSIS — I739 Peripheral vascular disease, unspecified: Secondary | ICD-10-CM | POA: Diagnosis not present

## 2023-08-11 DIAGNOSIS — I10 Essential (primary) hypertension: Secondary | ICD-10-CM | POA: Diagnosis not present

## 2023-08-11 DIAGNOSIS — M1909 Primary osteoarthritis, other specified site: Secondary | ICD-10-CM | POA: Diagnosis not present

## 2023-08-11 DIAGNOSIS — Z6835 Body mass index (BMI) 35.0-35.9, adult: Secondary | ICD-10-CM | POA: Diagnosis not present

## 2023-08-11 DIAGNOSIS — I4891 Unspecified atrial fibrillation: Secondary | ICD-10-CM | POA: Diagnosis not present

## 2023-08-11 DIAGNOSIS — K219 Gastro-esophageal reflux disease without esophagitis: Secondary | ICD-10-CM | POA: Diagnosis not present

## 2023-08-11 DIAGNOSIS — E7800 Pure hypercholesterolemia, unspecified: Secondary | ICD-10-CM | POA: Diagnosis not present

## 2023-08-11 DIAGNOSIS — I700 Atherosclerosis of aorta: Secondary | ICD-10-CM | POA: Diagnosis not present

## 2023-08-15 DIAGNOSIS — M1909 Primary osteoarthritis, other specified site: Secondary | ICD-10-CM | POA: Diagnosis not present

## 2023-08-15 DIAGNOSIS — I4891 Unspecified atrial fibrillation: Secondary | ICD-10-CM | POA: Diagnosis not present

## 2023-08-15 DIAGNOSIS — K219 Gastro-esophageal reflux disease without esophagitis: Secondary | ICD-10-CM | POA: Diagnosis not present

## 2023-08-15 DIAGNOSIS — I700 Atherosclerosis of aorta: Secondary | ICD-10-CM | POA: Diagnosis not present

## 2023-08-15 DIAGNOSIS — E7800 Pure hypercholesterolemia, unspecified: Secondary | ICD-10-CM | POA: Diagnosis not present

## 2023-08-15 DIAGNOSIS — I10 Essential (primary) hypertension: Secondary | ICD-10-CM | POA: Diagnosis not present

## 2023-08-15 DIAGNOSIS — I739 Peripheral vascular disease, unspecified: Secondary | ICD-10-CM | POA: Diagnosis not present

## 2023-08-15 DIAGNOSIS — Z6835 Body mass index (BMI) 35.0-35.9, adult: Secondary | ICD-10-CM | POA: Diagnosis not present

## 2023-08-17 DIAGNOSIS — I700 Atherosclerosis of aorta: Secondary | ICD-10-CM | POA: Diagnosis not present

## 2023-08-17 DIAGNOSIS — M1909 Primary osteoarthritis, other specified site: Secondary | ICD-10-CM | POA: Diagnosis not present

## 2023-08-17 DIAGNOSIS — I4891 Unspecified atrial fibrillation: Secondary | ICD-10-CM | POA: Diagnosis not present

## 2023-08-17 DIAGNOSIS — I739 Peripheral vascular disease, unspecified: Secondary | ICD-10-CM | POA: Diagnosis not present

## 2023-08-17 DIAGNOSIS — E7800 Pure hypercholesterolemia, unspecified: Secondary | ICD-10-CM | POA: Diagnosis not present

## 2023-08-17 DIAGNOSIS — Z6835 Body mass index (BMI) 35.0-35.9, adult: Secondary | ICD-10-CM | POA: Diagnosis not present

## 2023-08-17 DIAGNOSIS — K219 Gastro-esophageal reflux disease without esophagitis: Secondary | ICD-10-CM | POA: Diagnosis not present

## 2023-08-17 DIAGNOSIS — I10 Essential (primary) hypertension: Secondary | ICD-10-CM | POA: Diagnosis not present

## 2023-08-22 DIAGNOSIS — I10 Essential (primary) hypertension: Secondary | ICD-10-CM | POA: Diagnosis not present

## 2023-08-23 DIAGNOSIS — M545 Low back pain, unspecified: Secondary | ICD-10-CM | POA: Diagnosis not present

## 2023-08-30 DIAGNOSIS — I739 Peripheral vascular disease, unspecified: Secondary | ICD-10-CM | POA: Diagnosis not present

## 2023-08-30 DIAGNOSIS — I700 Atherosclerosis of aorta: Secondary | ICD-10-CM | POA: Diagnosis not present

## 2023-08-30 DIAGNOSIS — I4891 Unspecified atrial fibrillation: Secondary | ICD-10-CM | POA: Diagnosis not present

## 2023-08-30 DIAGNOSIS — Z6835 Body mass index (BMI) 35.0-35.9, adult: Secondary | ICD-10-CM | POA: Diagnosis not present

## 2023-08-30 DIAGNOSIS — I10 Essential (primary) hypertension: Secondary | ICD-10-CM | POA: Diagnosis not present

## 2023-08-30 DIAGNOSIS — E7800 Pure hypercholesterolemia, unspecified: Secondary | ICD-10-CM | POA: Diagnosis not present

## 2023-08-30 DIAGNOSIS — M1909 Primary osteoarthritis, other specified site: Secondary | ICD-10-CM | POA: Diagnosis not present

## 2023-08-30 DIAGNOSIS — K219 Gastro-esophageal reflux disease without esophagitis: Secondary | ICD-10-CM | POA: Diagnosis not present

## 2023-09-07 DIAGNOSIS — Z6835 Body mass index (BMI) 35.0-35.9, adult: Secondary | ICD-10-CM | POA: Diagnosis not present

## 2023-09-07 DIAGNOSIS — I739 Peripheral vascular disease, unspecified: Secondary | ICD-10-CM | POA: Diagnosis not present

## 2023-09-07 DIAGNOSIS — E7800 Pure hypercholesterolemia, unspecified: Secondary | ICD-10-CM | POA: Diagnosis not present

## 2023-09-07 DIAGNOSIS — M1909 Primary osteoarthritis, other specified site: Secondary | ICD-10-CM | POA: Diagnosis not present

## 2023-09-07 DIAGNOSIS — K219 Gastro-esophageal reflux disease without esophagitis: Secondary | ICD-10-CM | POA: Diagnosis not present

## 2023-09-07 DIAGNOSIS — I10 Essential (primary) hypertension: Secondary | ICD-10-CM | POA: Diagnosis not present

## 2023-09-07 DIAGNOSIS — I4891 Unspecified atrial fibrillation: Secondary | ICD-10-CM | POA: Diagnosis not present

## 2023-09-07 DIAGNOSIS — I700 Atherosclerosis of aorta: Secondary | ICD-10-CM | POA: Diagnosis not present

## 2023-09-21 DIAGNOSIS — I10 Essential (primary) hypertension: Secondary | ICD-10-CM | POA: Diagnosis not present

## 2023-10-21 DIAGNOSIS — I10 Essential (primary) hypertension: Secondary | ICD-10-CM | POA: Diagnosis not present

## 2023-11-21 DIAGNOSIS — I700 Atherosclerosis of aorta: Secondary | ICD-10-CM | POA: Diagnosis not present

## 2023-11-21 DIAGNOSIS — N390 Urinary tract infection, site not specified: Secondary | ICD-10-CM | POA: Diagnosis not present

## 2023-11-21 DIAGNOSIS — I10 Essential (primary) hypertension: Secondary | ICD-10-CM | POA: Diagnosis not present

## 2023-11-21 DIAGNOSIS — I4891 Unspecified atrial fibrillation: Secondary | ICD-10-CM | POA: Diagnosis not present

## 2023-11-21 DIAGNOSIS — I739 Peripheral vascular disease, unspecified: Secondary | ICD-10-CM | POA: Diagnosis not present

## 2023-11-22 ENCOUNTER — Encounter (HOSPITAL_COMMUNITY): Payer: Self-pay | Admitting: Emergency Medicine

## 2023-11-22 ENCOUNTER — Emergency Department (HOSPITAL_COMMUNITY): Payer: Medicare HMO

## 2023-11-22 ENCOUNTER — Emergency Department (HOSPITAL_COMMUNITY)
Admission: EM | Admit: 2023-11-22 | Discharge: 2023-12-03 | Disposition: A | Discharge status: Home / Self Care | Payer: Medicare HMO

## 2023-11-22 ENCOUNTER — Other Ambulatory Visit: Payer: Self-pay

## 2023-11-22 DIAGNOSIS — S40911A Unspecified superficial injury of right shoulder, initial encounter: Secondary | ICD-10-CM | POA: Diagnosis present

## 2023-11-22 DIAGNOSIS — S42401A Unspecified fracture of lower end of right humerus, initial encounter for closed fracture: Secondary | ICD-10-CM | POA: Diagnosis not present

## 2023-11-22 DIAGNOSIS — S80919A Unspecified superficial injury of unspecified knee, initial encounter: Secondary | ICD-10-CM | POA: Diagnosis not present

## 2023-11-22 DIAGNOSIS — S0990XA Unspecified injury of head, initial encounter: Secondary | ICD-10-CM | POA: Insufficient documentation

## 2023-11-22 DIAGNOSIS — Z79899 Other long term (current) drug therapy: Secondary | ICD-10-CM | POA: Diagnosis not present

## 2023-11-22 DIAGNOSIS — I10 Essential (primary) hypertension: Secondary | ICD-10-CM | POA: Diagnosis not present

## 2023-11-22 DIAGNOSIS — W19XXXA Unspecified fall, initial encounter: Secondary | ICD-10-CM | POA: Insufficient documentation

## 2023-11-22 DIAGNOSIS — M25521 Pain in right elbow: Secondary | ICD-10-CM | POA: Diagnosis not present

## 2023-11-22 DIAGNOSIS — M2540 Effusion, unspecified joint: Secondary | ICD-10-CM | POA: Diagnosis not present

## 2023-11-22 DIAGNOSIS — Z743 Need for continuous supervision: Secondary | ICD-10-CM | POA: Diagnosis not present

## 2023-11-22 DIAGNOSIS — Z7901 Long term (current) use of anticoagulants: Secondary | ICD-10-CM | POA: Insufficient documentation

## 2023-11-22 DIAGNOSIS — S42411A Displaced simple supracondylar fracture without intercondylar fracture of right humerus, initial encounter for closed fracture: Secondary | ICD-10-CM

## 2023-11-22 DIAGNOSIS — M1711 Unilateral primary osteoarthritis, right knee: Secondary | ICD-10-CM | POA: Diagnosis not present

## 2023-11-22 DIAGNOSIS — M25561 Pain in right knee: Secondary | ICD-10-CM | POA: Diagnosis not present

## 2023-11-22 DIAGNOSIS — S82101D Unspecified fracture of upper end of right tibia, subsequent encounter for closed fracture with routine healing: Secondary | ICD-10-CM | POA: Diagnosis not present

## 2023-11-22 LAB — CBC
HCT: 45.1 % (ref 36.0–46.0)
Hemoglobin: 14.4 g/dL (ref 12.0–15.0)
MCH: 29.5 pg (ref 26.0–34.0)
MCHC: 31.9 g/dL (ref 30.0–36.0)
MCV: 92.4 fL (ref 80.0–100.0)
Platelets: 235 10*3/uL (ref 150–400)
RBC: 4.88 MIL/uL (ref 3.87–5.11)
RDW: 14.1 % (ref 11.5–15.5)
WBC: 12.8 10*3/uL — ABNORMAL HIGH (ref 4.0–10.5)
nRBC: 0 % (ref 0.0–0.2)

## 2023-11-22 LAB — BASIC METABOLIC PANEL
Anion gap: 9 (ref 5–15)
BUN: 14 mg/dL (ref 8–23)
CO2: 24 mmol/L (ref 22–32)
Calcium: 9.4 mg/dL (ref 8.9–10.3)
Chloride: 105 mmol/L (ref 98–111)
Creatinine, Ser: 0.65 mg/dL (ref 0.44–1.00)
GFR, Estimated: >60 mL/min (ref >60)
Glucose, Bld: 122 mg/dL — ABNORMAL HIGH (ref 70–99)
Potassium: 4 mmol/L (ref 3.5–5.1)
Sodium: 138 mmol/L (ref 135–145)

## 2023-11-22 MED ORDER — RIVAROXABAN 20 MG PO TABS
20.0000 mg | ORAL_TABLET | Freq: Every day | ORAL | Status: DC
  Administered 2023-11-22 – 2023-12-02 (×11): 20 mg via ORAL
  Filled 2023-11-22 (×11): qty 1

## 2023-11-22 MED ORDER — OXYCODONE-ACETAMINOPHEN 5-325 MG PO TABS
1.0000 | ORAL_TABLET | Freq: Once | ORAL | Status: AC
  Administered 2023-11-22: 1 via ORAL
  Filled 2023-11-22: qty 1

## 2023-11-22 MED ORDER — ACETAMINOPHEN 325 MG PO TABS
650.0000 mg | ORAL_TABLET | ORAL | Status: DC | PRN
  Administered 2023-11-30: 650 mg via ORAL
  Filled 2023-11-22: qty 2

## 2023-11-22 MED ORDER — HYDROCODONE-ACETAMINOPHEN 5-325 MG PO TABS
1.0000 | ORAL_TABLET | Freq: Four times a day (QID) | ORAL | Status: DC | PRN
  Administered 2023-11-23 – 2023-12-03 (×19): 1 via ORAL
  Filled 2023-11-22 (×21): qty 1

## 2023-11-22 MED ORDER — LOSARTAN POTASSIUM 25 MG PO TABS
100.0000 mg | ORAL_TABLET | Freq: Every day | ORAL | Status: DC
  Administered 2023-11-22 – 2023-12-02 (×11): 100 mg via ORAL
  Filled 2023-11-22 (×11): qty 4

## 2023-11-22 MED ORDER — OXYCODONE-ACETAMINOPHEN 5-325 MG PO TABS: 1.0000 | ORAL_TABLET | Freq: Four times a day (QID) | ORAL | 0 refills | Status: DC | PRN

## 2023-11-22 MED ORDER — METOPROLOL SUCCINATE ER 50 MG PO TB24
50.0000 mg | ORAL_TABLET | Freq: Every day | ORAL | Status: DC
  Administered 2023-11-22 – 2023-12-02 (×11): 50 mg via ORAL
  Filled 2023-11-22 (×11): qty 1

## 2023-11-22 MED ORDER — DOXYCYCLINE HYCLATE 100 MG PO TABS
100.0000 mg | ORAL_TABLET | Freq: Two times a day (BID) | ORAL | Status: AC
  Administered 2023-11-22 – 2023-12-01 (×19): 100 mg via ORAL
  Filled 2023-11-22 (×19): qty 1

## 2023-11-22 NOTE — ED Notes (Signed)
Pt's husband requests pt get her medications tonight-losartan 100 mg, doxycycline 100 mg, metoprolol ER 50 mg, xarelto 20mg . All are once daily except for the doxycycline which is bid. Provider notified.

## 2023-11-22 NOTE — ED Provider Notes (Signed)
 [facility]PART[facility] AT [facility] Provider Note   CSN: [id number] Arrival date & time: 11/22/23  1152     History  Chief Complaint  Patient presents with   [place]    Diana [name] is a 89 y.o. female.  Is an 89 year old female presenting emergency department for fall complaining of pain to her right elbow and to her right knee.  She states she tried to get up unsure what happened she just fell.  Denies passing out.  No preceding symptoms.  She is on Xarelto .  No chest pain no shortness of breath no abdominal pain.  No numbness tingling changes in sensation in extremities.   Fall       Home Medications Prior to Admission medications   Medication Sig Start Date End Date Taking? Authorizing Provider  oxyCODONE -acetaminophen  (PERCOCET/ROXICET) 5-325 MG tablet Take 1 tablet by mouth every 6 (six) hours as needed for severe pain (pain score 7-10). 11/22/23  Yes Neysa Caron PARAS, DO  Ascorbic Acid (VITAMIN C) 100 MG tablet Take 100 mg by mouth daily.    [provider]  chlorthalidone (HYGROTON) 25 MG tablet Take 25 mg by mouth daily. 12/27/19   [provider]  cholecalciferol  (VITAMIN D3) 25 MCG (1000 UT) tablet Take 1,000 Units by mouth daily.    [provider]  conjugated estrogens  (PREMARIN ) vaginal cream Place 1 Applicatorful vaginally every other day. Patient not taking: Reported on 10/13/2020 06/04/20   Sherrilee Belvie CROME, MD  doxycycline  (VIBRA -TABS) 100 MG tablet Take 100 mg by mouth daily. 10/03/20   [provider]  ELDERBERRY PO Take by mouth.    [provider]  furosemide (LASIX) 20 MG tablet Take 20 mg by mouth daily as needed. 11/21/19   [provider]  HYDROcodone -acetaminophen  (NORCO/VICODIN) 5-325 MG tablet Take 1 tablet by mouth every 6 (six) hours as needed for severe pain. 03/16/22   Midge Golas, MD  latanoprost  (XALATAN ) 0.005 % ophthalmic solution Place 1 drop into both eyes  at bedtime. 07/14/17   [provider]  meclizine  (ANTIVERT ) 12.5 MG tablet Take 12.5 mg by mouth 3 (three) times daily as needed for dizziness or nausea.  07/14/16   [provider]  metoprolol  succinate (TOPROL -XL) 25 MG 24 hr tablet Take by mouth. 04/14/20 05/14/20  [provider]  rivaroxaban  (XARELTO ) 20 MG TABS tablet Take 20 mg by mouth daily.    [provider]  zinc  gluconate 50 MG tablet Take 50 mg by mouth daily.    [provider]      Allergies    Patient has no known allergies.    Review of Systems   Review of Systems  Physical Exam Updated Vital Signs BP (!) 164/86 (BP Location: Left Arm)   Pulse 79   Temp 98.2 F (36.8 C) (Oral)   Resp 18   SpO2 100%  Physical Exam Vitals and nursing note reviewed.  Constitutional:      Comments: Uncomfortable appearing  HENT:     Head: Normocephalic and atraumatic.     Mouth/Throat:     Mouth: Mucous membranes are moist.  Eyes:     Conjunctiva/sclera: Conjunctivae normal.  Cardiovascular:     Rate and Rhythm: Normal rate and regular rhythm.  Pulmonary:     Effort: Pulmonary effort is normal.     Breath sounds: Normal breath sounds.  Abdominal:     General: Abdomen is flat. There is no distension.     Tenderness: There  is no abdominal tenderness. There is no guarding or rebound.  Musculoskeletal:     Cervical back: Normal range of motion. No tenderness.     Comments: Patient with pain to her right knee and as well as her left forearm and elbow.  Chest wall stable nontender.  Pelvis stable nontender.  She has 2+ radial pulses bilateral upper extremities.  Normal sensation in her bilateral upper extremities.  Strength in right upper extremity appears grossly intact, but limited secondary to pain.  Soft compartments in all extremities.  Skin:    Capillary Refill: Capillary refill takes less than 2 seconds.  Neurological:     Mental Status: She is alert and oriented to person, place,  and time.  Psychiatric:        Mood and Affect: Mood normal.        Behavior: Behavior normal.     ED Results / Procedures / Treatments   Labs (all labs ordered are listed, but only abnormal results are displayed) Labs Reviewed - No data to display  EKG None  Radiology CT Head Wo Contrast Result Date: 11/22/2023 CLINICAL DATA:  Head trauma, minor (Age >= 65y) EXAM: CT HEAD WITHOUT CONTRAST TECHNIQUE: Contiguous axial images were obtained from the base of the skull through the vertex without intravenous contrast. RADIATION DOSE REDUCTION: This exam was performed according to the departmental dose-optimization program which includes automated exposure control, adjustment of the mA and/or kV according to patient size and/or use of iterative reconstruction technique. COMPARISON:  CT head 08/10/2017. FINDINGS: Brain: No evidence of acute infarction, hemorrhage, hydrocephalus, extra-axial collection or mass lesion/mass effect. Partially empty sella. Vascular: No hyperdense vessel. Skull: No acute fracture. Sinuses/Orbits: Clear sinuses.  No acute orbital findings. Other: No mastoid effusions. IMPRESSION: No evidence of acute intracranial abnormality. Electronically Signed   By: Gilmore GORMAN Molt M.D.   On: 11/22/2023 15:29   DG Forearm Right Result Date: 11/22/2023 CLINICAL DATA:  Right elbow pain after fall. EXAM: RIGHT FOREARM - 2 VIEW COMPARISON:  None Available. FINDINGS: Displaced distal humeral fracture with joint effusion. No fracture of the forearm. The radius and ulna are intact. Wrist alignment is maintained. Mild soft tissue edema. IMPRESSION: 1. Displaced distal humeral fracture with joint effusion. 2. No fracture of the forearm. Electronically Signed   By: Andrea Gasman M.D.   On: 11/22/2023 15:02   DG Knee 2 Views Right Result Date: 11/22/2023 CLINICAL DATA:  Right knee pain after fall. EXAM: RIGHT KNEE - 1-2 VIEW COMPARISON:  Radiograph 02/28/2019 FINDINGS: The bones are  subjectively under mineralized. Healed fractures of the proximal tibia and fibular metaphysis. No evidence of acute fracture. No dislocation. No significant joint effusion. Mild osteoarthritis with chondrocalcinosis. IMPRESSION: 1. No acute fracture or subluxation of the right knee. 2. Healed fractures of the proximal tibia and fibular metaphysis. Electronically Signed   By: Andrea Gasman M.D.   On: 11/22/2023 15:01    Procedures Procedures    Medications Ordered in ED Medications  oxyCODONE -acetaminophen  (PERCOCET/ROXICET) 5-325 MG per tablet 1 tablet (1 tablet Oral Given 11/22/23 1332)    ED Course/ Medical Decision Making/ A&P Clinical Course as of 11/22/23 1612  Tue Nov 22, 2023  1439 DG Forearm Right Appears to have supracondylar fracture.  Paging Ortho for possible intervention [TY]  1449 Spoke with Dr. Margrette with ortho.  He reviewed images.  No need for acute intervention at this time.  Recommending posterior long-arm splint, sling pain control and patient to follow-up in clinic. [TY]  1507 DG Forearm Right IMPRESSION: 1. Displaced distal humeral fracture with joint effusion. 2. No fracture of the forearm.   [TY]    Clinical Course User Index [TY] Neysa Caron PARAS, DO                                 Medical Decision Making Is an 89 year old female presenting emergency department with right knee and elbow pain after fall.  She is on Xarelto .  Hypertensive, but otherwise hemodynamically stable and nontachycardic.  Some tenderness to her right elbow and knee, but otherwise reassuring exam.  CT head given she is on Xarelto  with fall.  On my independent interpretation no gross intracranial pathology.  X-ray of the humerus does show supracondylar fracture.  Paging Ortho.  X-ray knee without obvious fracture on my independent interpretation; awaiting radiology overread.  Patient treated with Percocet.  Splinting and posterior long-arm.  Amount and/or Complexity of Data  Reviewed Radiology: ordered. Decision-making details documented in ED Course.  Risk Prescription drug management.          Final Clinical Impression(s) / ED Diagnoses Final diagnoses:  Closed supracondylar fracture of right humerus, initial encounter    Rx / DC Orders ED Discharge Orders          Ordered    oxyCODONE -acetaminophen  (PERCOCET/ROXICET) 5-325 MG tablet  Every 6 hours PRN        11/22/23 1548              Neysa Caron PARAS, DO 11/22/23 1612

## 2023-11-22 NOTE — ED Triage Notes (Signed)
Pt arrived via [facility] from home for a fall R arm and R knee pain. Denies hitting head and endorses blood thinners. Pt uses a wheelchair and walker to get around and also family help. Hx of HTN

## 2023-11-22 NOTE — Discharge Instructions (Addendum)
 Please follow-up with the orthopedic surgeon Dr. Arloa.  Please call to schedule an appointment.  Please take over-the-counter Tylenol  alternating with ibuprofen for pain.  We are prescribing you Percocet for breakthrough pain.  Please return immediately if develop fevers, chills, severe pain, inability to feel hand or any new or worsening symptoms that are concerning to you.       Go to [facility] today

## 2023-11-22 NOTE — ED Provider Notes (Signed)
 The patient was signed out to me after a fall with distal humerus fracture.  She is otherwise neurovascularly intact.  The patient's family was concerned about taking the patient home as she is bedbound at baseline and can no longer help transfer.  Labs are ordered.  Discussed her case with orthopedics after her labs come back to determine ultimate disposition.  Physical Exam  BP (!) 144/60 (BP Location: Left Arm)   Pulse 79   Temp 98.7 F (37.1 C) (Oral)   Resp 16   SpO2 99%   Physical Exam  Procedures  Procedures  ED Course / MDM   Clinical Course as of 11/22/23 2005  Tue Nov 22, 2023  1439 DG Forearm Right Appears to have supracondylar fracture.  Paging Ortho for possible intervention [TY]  1449 Spoke with Dr. Margrette with ortho.  He reviewed images.  No need for acute intervention at this time.  Recommending posterior long-arm splint, sling pain control and patient to follow-up in clinic. [TY]  1507 DG Forearm Right IMPRESSION: 1. Displaced distal humeral fracture with joint effusion. 2. No fracture of the forearm.   [TY]    Clinical Course User Index [TY] Neysa Caron PARAS, DO   Medical Decision Making Amount and/or Complexity of Data Reviewed Labs: ordered. Radiology: ordered. Decision-making details documented in ED Course.  Risk OTC drugs. Prescription drug management.   I did speak with Dr. Margrette again who did inform me that the patient is not a surgical candidate.  She is placed in observation here in the emergency department and physical therapy/Occupational Therapy consults are placed.       Ula Prentice SAUNDERS, MD 11/22/23 2007

## 2023-11-23 DIAGNOSIS — M25561 Pain in right knee: Secondary | ICD-10-CM | POA: Diagnosis not present

## 2023-11-23 DIAGNOSIS — S0990XA Unspecified injury of head, initial encounter: Secondary | ICD-10-CM | POA: Diagnosis not present

## 2023-11-23 DIAGNOSIS — M25521 Pain in right elbow: Secondary | ICD-10-CM | POA: Diagnosis not present

## 2023-11-23 DIAGNOSIS — Z7901 Long term (current) use of anticoagulants: Secondary | ICD-10-CM | POA: Diagnosis not present

## 2023-11-23 DIAGNOSIS — S42411A Displaced simple supracondylar fracture without intercondylar fracture of right humerus, initial encounter for closed fracture: Secondary | ICD-10-CM | POA: Diagnosis not present

## 2023-11-23 DIAGNOSIS — Z79899 Other long term (current) drug therapy: Secondary | ICD-10-CM | POA: Diagnosis not present

## 2023-11-23 NOTE — ED Notes (Signed)
 Helped patient eat her breakfast and assisted her with her beverages, pt just wanted her bacon and said the rest did not look good and drank her orange juice.

## 2023-11-23 NOTE — ED Notes (Signed)
 Sacral mepilex placed for protective measures

## 2023-11-23 NOTE — Progress Notes (Signed)
Awaiting PT.  

## 2023-11-23 NOTE — Evaluation (Signed)
 Physical Therapy Evaluation Patient Details Name: Diana [name] MRN: [id number] DOB: February 24, 1934 Today's Date: 11/23/2023  History of Present Illness  89 y.o. F admitted on 11/22/23 due to fall at home. X-Ray's indicate a R distal humerus fracture. PMH significant for htn.    Clinical Impression  Patient lying in bed on therapists arrival.  Pleasant and agreeable to PT/OT evaluation.  Patient has her right arm in splint and elevated on pillow.  Reports 8/10 pain right arm and 5/10 pain right knee.  Patient needs mod assist x 2 for supine to sit; tends to lean back and needs continued min to mod A x 2 assist to maintain balance while sitting.  Reports feeling lightheaded while sitting on the edge of the bed so PT did not attempt sit to stand transfer.  Patient needed mod A x 2 to return to supine.  patient left in bed with call button in reach and nursing notified of mobility status.  Patient will benefit from continued skilled therapy services during the remainder of her hospital stay and at the next recommended venue of care to address deficits and promote return to optimal function.           If plan is discharge home, recommend the following: A lot of help with walking and/or transfers;A lot of help with bathing/dressing/bathroom;Assistance with cooking/housework;Assist for transportation   Can travel by private vehicle   No    Equipment Recommendations None recommended by PT  Recommendations for Other Services       Functional Status Assessment Patient has had a recent decline in their functional status and demonstrates the ability to make significant improvements in function in a reasonable and predictable amount of time.     Precautions / Restrictions Precautions Precautions: Fall;Shoulder Type of Shoulder Precautions: R distal humerus fracture Shoulder Interventions: Shoulder sling/immobilizer Restrictions Weight Bearing Restrictions Per Provider Order: Yes RUE Weight  Bearing Per Provider Order: Non weight bearing      Mobility  Bed Mobility                    Transfers                        Ambulation/Gait                  Stairs            Wheelchair Mobility     Tilt Bed    Modified Rankin (Stroke Patients Only)       Balance                                             Pertinent Vitals/Pain      Home Living Family/patient expects to be discharged to:: Private residence Living Arrangements: Spouse/significant other Available Help at Discharge: Family;Available PRN/intermittently Type of Home: House Home Access: Ramped entrance     Alternate Level Stairs-Number of Steps: full flight Home Layout: Two level;Able to live on main level with bedroom/bathroom Home Equipment: BSC/3in1;Rollator (4 wheels);Wheelchair - manual;Grab bars - toilet;Hand held shower head      Prior Function Prior Level of Function : Needs assist             Mobility Comments: Pt reports that she walks with a walker. ADLs Comments: Daughter assists with all ADL's and IADL's, most likely max  assist.     Extremity/Trunk Assessment   Upper Extremity Assessment Upper Extremity Assessment: Right hand dominant;RUE deficits/detail RUE Deficits / Details: NWB due to distal humerus fracture RUE Coordination: decreased gross motor;decreased fine motor    Lower Extremity Assessment Lower Extremity Assessment: Defer to PT evaluation    Cervical / Trunk Assessment Cervical / Trunk Assessment: Kyphotic  Communication   Communication Communication: No apparent difficulties  Cognition                                                General Comments General comments (skin integrity, edema, etc.): VSS on RA    Exercises     Assessment/Plan    PT Assessment    PT Problem List         PT Treatment Interventions      PT Goals (Current goals can be found in the Care Plan  section)  Acute Rehab PT Goals Patient Stated Goal: return home after rehab PT Goal Formulation: With patient Time For Goal Achievement: 12/07/23 Potential to Achieve Goals: Good    Frequency       Co-evaluation PT/OT/SLP Co-Evaluation/Treatment: Yes Reason for Co-Treatment: For patient/therapist safety;To address functional/ADL transfers PT goals addressed during session: Mobility/safety with mobility;Balance         AM-PAC PT 6 Clicks Mobility  Outcome Measure Help needed turning from your back to your side while in a flat bed without using bedrails?: A Lot Help needed moving from lying on your back to sitting on the side of a flat bed without using bedrails?: A Lot Help needed moving to and from a bed to a chair (including a wheelchair)?: A Lot Help needed standing up from a chair using your arms (e.g., wheelchair or bedside chair)?: A Lot Help needed to walk in hospital room?: Total Help needed climbing 3-5 steps with a railing? : Total 6 Click Score: 10    End of Session   Activity Tolerance: Patient limited by pain Patient left: in bed;with call bell/phone within reach Nurse Communication: Mobility status PT Visit Diagnosis: Other abnormalities of gait and mobility (R26.89);Muscle weakness (generalized) (M62.81);History of falling (Z91.81);Pain Pain - Right/Left: Right Pain - part of body: Arm;Knee    Time: 9184-9154 PT Time Calculation (min) (ACUTE ONLY): 30 min   Charges:   PT Evaluation $PT Eval Low Complexity: 1 Low   PT General Charges $$ ACUTE PT VISIT: 1 Visit         9:51 AM, 11/23/23 Jannatul Wojdyla Small Rosalie Buenaventura MPT [facility] [place] [phone] Ph:[phone]

## 2023-11-23 NOTE — ED Notes (Signed)
 Family at bedside visiting with patient. Denies current needs. No S/S of distress noted.

## 2023-11-23 NOTE — Progress Notes (Signed)
 CSW presented bed offers to pt's husband. He has selected [facility]. CSW attempted to initiate Auth; however am unable to due to the holiday. Berkley Harvey will be started on tomorrow. ED CSW and Revonda Standard at [facility] notified.

## 2023-11-23 NOTE — Evaluation (Signed)
 Occupational Therapy Evaluation Patient Details Name: Diana [name] MRN: [id number] DOB: 1934/03/11 Today's Date: 11/23/2023   History of Present Illness 89 y.o. F admitted on 11/22/23 due to fall at home. X-Ray's indicate a R distal humerus fracture. PMH significant for htn.   Clinical Impression   Pt admitted for concerns listed above. PTA pt reported that she was able to ambulate with Rollator and WC at home, needing some assist for all ADL's and IADL's. At this time due to NWB in the RUE, as well as pain in the RUE and RLE, pt requiring max to total assist for all ADL's and functional mobility. She heavily leans posteriorly in sitting EOB, requiring min to mod assist for balanced/midline sitting. OT recommending further rehab to improve pt's independence and reduce caregiver burden.        If plan is discharge home, recommend the following: Two people to help with walking and/or transfers;Two people to help with bathing/dressing/bathroom;Direct supervision/assist for medications management;Assistance with cooking/housework;Assist for transportation;Help with stairs or ramp for entrance    Functional Status Assessment  Patient has had a recent decline in their functional status and demonstrates the ability to make significant improvements in function in a reasonable and predictable amount of time.  Equipment Recommendations  Tub/shower seat    Recommendations for Other Services       Precautions / Restrictions Precautions Precautions: Fall;Shoulder Type of Shoulder Precautions: R distal humerus fracture Shoulder Interventions: Shoulder sling/immobilizer Restrictions Weight Bearing Restrictions Per Provider Order: Yes RUE Weight Bearing Per Provider Order: Non weight bearing      Mobility Bed Mobility Overal bed mobility: Needs Assistance Bed Mobility: Supine to Sit, Sit to Supine     Supine to sit: Mod assist, +2 for physical assistance, +2 for safety/equipment Sit to  supine: Mod assist, +2 for physical assistance, +2 for safety/equipment   General bed mobility comments: Pt with heavy mod assist to come to sitting with posterior lean, reporting some dizziness    Transfers                   General transfer comment: deferred standing due to poor sitting balance.      Balance Overall balance assessment: Needs assistance Sitting-balance support: Single extremity supported, Feet supported Sitting balance-Leahy Scale: Poor Sitting balance - Comments: Unable to maintain sitting balance without support Postural control: Posterior lean                                 ADL either performed or assessed with clinical judgement   ADL Overall ADL's : Needs assistance/impaired Eating/Feeding: Minimal assistance;Bed level   Grooming: Minimal assistance;Bed level   Upper Body Bathing: Maximal assistance;Bed level   Lower Body Bathing: Total assistance;Bed level   Upper Body Dressing : Maximal assistance;Bed level   Lower Body Dressing: Total assistance;Bed level   Toilet Transfer: Total assistance;+2 for physical assistance;+2 for safety/equipment   Toileting- Clothing Manipulation and Hygiene: Total assistance;Bed level         General ADL Comments: Pt very limited in mobility due to pain and poor balance EOB. Requiring max assist for all ADL's     Vision Baseline Vision/History: 1 Wears glasses Ability to See in Adequate Light: 0 Adequate Patient Visual Report: No change from baseline Vision Assessment?: No apparent visual deficits     Perception         Praxis  Pertinent Vitals/Pain Pain Assessment Pain Assessment: 0-10 Pain Score: 8  Pain Location: RUE and RLE Pain Descriptors / Indicators: Aching, Constant, Discomfort, Grimacing, Guarding Pain Intervention(s): Limited activity within patient's tolerance, Monitored during session, Repositioned     Extremity/Trunk Assessment Upper Extremity  Assessment Upper Extremity Assessment: Right hand dominant;RUE deficits/detail RUE Deficits / Details: NWB due to distal humerus fracture RUE Coordination: decreased gross motor;decreased fine motor   Lower Extremity Assessment Lower Extremity Assessment: Defer to PT evaluation   Cervical / Trunk Assessment Cervical / Trunk Assessment: Kyphotic   Communication Communication Communication: No apparent difficulties   Cognition Arousal: Alert Behavior During Therapy: WFL for tasks assessed/performed Overall Cognitive Status: No family/caregiver present to determine baseline cognitive functioning                                       General Comments  VSS on RA    Exercises     Shoulder Instructions      Home Living Family/patient expects to be discharged to:: Private residence Living Arrangements: Spouse/significant other Available Help at Discharge: Family;Available PRN/intermittently Type of Home: House Home Access: Ramped entrance     Home Layout: Two level;Able to live on main level with bedroom/bathroom Alternate Level Stairs-Number of Steps: full flight Alternate Level Stairs-Rails: Right;Left;Can reach both Bathroom Shower/Tub: Producer, Television/film/video: Handicapped height Bathroom Accessibility: Yes How Accessible: Accessible via walker Home Equipment: BSC/3in1;Rollator (4 wheels);Wheelchair - manual;Grab bars - toilet;Hand held shower head          Prior Functioning/Environment Prior Level of Function : Needs assist             Mobility Comments: Pt reports that she walks with a walker. ADLs Comments: Daughter assists with all ADL's and IADL's, most likely max assist.        OT Problem List: Decreased strength;Decreased range of motion;Decreased activity tolerance;Impaired balance (sitting and/or standing);Decreased cognition;Decreased safety awareness;Decreased knowledge of use of DME or AE;Impaired UE functional use;Pain       OT Treatment/Interventions: Self-care/ADL training;Therapeutic exercise;Energy conservation;DME and/or AE instruction;Therapeutic activities;Patient/family education;Balance training;Cognitive remediation/compensation    OT Goals(Current goals can be found in the care plan section) Acute Rehab OT Goals Patient Stated Goal: To get stronger OT Goal Formulation: With patient Time For Goal Achievement: 12/07/23 Potential to Achieve Goals: Fair ADL Goals Pt Will Perform Grooming: with supervision;sitting Pt Will Perform Upper Body Bathing: with min assist;sitting Pt Will Perform Lower Body Bathing: with mod assist;sitting/lateral leans Pt Will Perform Upper Body Dressing: with min assist;sitting Pt Will Perform Lower Body Dressing: with mod assist;sitting/lateral leans Pt Will Transfer to Toilet: with mod assist;stand pivot transfer Pt Will Perform Toileting - Clothing Manipulation and hygiene: with mod assist;sitting/lateral leans  OT Frequency: Min 1X/week    Co-evaluation              AM-PAC OT 6 Clicks Daily Activity     Outcome Measure Help from another person eating meals?: A Little Help from another person taking care of personal grooming?: A Little Help from another person toileting, which includes using toliet, bedpan, or urinal?: Total Help from another person bathing (including washing, rinsing, drying)?: A Lot Help from another person to put on and taking off regular upper body clothing?: A Lot Help from another person to put on and taking off regular lower body clothing?: Total 6 Click Score: 12   End of  Session Nurse Communication: Mobility status  Activity Tolerance: Patient tolerated treatment well;Patient limited by pain Patient left: in bed;with call bell/phone within reach  OT Visit Diagnosis: Unsteadiness on feet (R26.81);Other abnormalities of gait and mobility (R26.89);Muscle weakness (generalized) (M62.81);Pain Pain - Right/Left: Right Pain - part of  body: Arm;Knee                Time: 9180-9162 OT Time Calculation (min): 18 min Charges:  OT General Charges $OT Visit: 1 Visit OT Evaluation $OT Eval Moderate Complexity: 1 Mod  Erikka Follmer Thelbert, OTR/L [facility]  Shital Crayton Elane Thelbert 11/23/2023, 9:23 AM

## 2023-11-23 NOTE — Progress Notes (Addendum)
 Transition of Care [facility]) - Emergency Department Mini Assessment   Patient Details  Name: Diana [name] MRN: [id number] Date of Birth: 11/03/1934  Transition of Care [facility]) CM/SW Contact:    Kari JONETTA Daisy, LCSW Phone Number: 11/23/2023, 12:02 PM   Clinical Narrative: [facility] consulted for SNF. CSW noted PT's recommendation for SNF. CSW spoke with pt's husband who reports she needs assistance and they are open to STR at [facility]. He is open to pt being faxed out to facilities in [place] and [place]. CSW informed pt's husband that there may be a delay on offers today due to holiday and he verbalized understanding.   Pt faxed out. Bed offers pending.    ED Mini Assessment: What brought you to the Emergency Department? : fall  Barriers to Discharge: SNF Pending bed offer  Barrier interventions: SNF workup  Means of departure: Ambulance       Patient Contact and Communications Key Contact 1: Cain,Odell (Spouse)  [phone] (Mobile)   Spoke with: Cain,Odell (Spouse)  [phone] (Mobile) Contact Date: 11/23/23,   Contact time: 0155 Contact Phone Number: Cain,Odell (Spouse)  [phone] (Mobile) Call outcome: Family interested in pt going to SNF    CMS Medicare.gov Compare Post Acute Care list provided to:: Patient Represenative (must comment) (Spouse) Choice offered to / list presented to : Spouse  Admission diagnosis:  Fall Patient Active Problem List   Diagnosis Date Noted   Gross hematuria 03/17/2020   Atrial fibrillation with RVR (HCC) 01/23/2019   Unspecified atrial fibrillation (HCC) 01/20/2019   Closed fracture of lateral portion of right tibial plateau, with routine healing, subsequent encounter 01/18/19 01/20/2019   Fracture 01/19/2019   Anticoagulation adequate 08/14/2018   Obesity (BMI 30-39.9) 08/14/2018   Nausea and vomiting in adult 08/11/2017   Essential hypertension 08/11/2017   Pre-syncope 08/10/2017   Elevated troponin 08/10/2017   DVT (deep venous  thrombosis) (HCC) 08/10/2016   Pulmonary embolism (HCC) 08/10/2016   HTN (hypertension) 08/10/2016   PCP:  Maree Isles, MD Pharmacy:   [facility] [address], [place] - [address] [place] [address] Phone: [phone] Fax: [phone]

## 2023-11-23 NOTE — ED Notes (Signed)
 Pt pulled to ft24 for pericare; urine-soaked brief and pad removed, bed sheets changed and new brief placed. No skin break down noted.

## 2023-11-23 NOTE — ED Notes (Signed)
 PT/OT assessing patient in room.

## 2023-11-23 NOTE — NC FL2 (Signed)
 [place]  MEDICAID [id number] LEVEL OF CARE FORM     IDENTIFICATION  Patient Name: Diana [name] Birthdate: [birth year] Sex: female Admission Date (Current Location): 11/22/2023  [facility] and [place] Number:  [employer] and Address:  [facility],  [address], [place] [address]      Provider Number: [id number]  Attending Physician Name and Address:  System, Provider Not In  Relative Name and Phone Number:  Cain,Odell (Spouse)  [phone] [facility])    Current Level of Care: Hospital Recommended Level of Care: Skilled Nursing Facility Prior Approval Number:    Date Approved/Denied:   PASRR Number: [id number]  Discharge Plan: SNF    Current Diagnoses: Patient Active Problem List   Diagnosis Date Noted   Gross hematuria 03/17/2020   Atrial fibrillation with RVR (HCC) 01/23/2019   Unspecified atrial fibrillation (HCC) 01/20/2019   Closed fracture of lateral portion of right tibial plateau, with routine healing, subsequent encounter 01/18/19 01/20/2019   Fracture 01/19/2019   Anticoagulation adequate 08/14/2018   Obesity (BMI 30-39.9) 08/14/2018   Nausea and vomiting in adult 08/11/2017   Essential hypertension 08/11/2017   Pre-syncope 08/10/2017   Elevated troponin 08/10/2017   DVT (deep venous thrombosis) (HCC) 08/10/2016   Pulmonary embolism (HCC) 08/10/2016   HTN (hypertension) 08/10/2016    Orientation RESPIRATION BLADDER Height & Weight     Self, Situation, Place, Time  Normal Continent Weight:   Height:     BEHAVIORAL SYMPTOMS/MOOD NEUROLOGICAL BOWEL NUTRITION STATUS      Continent Diet (Regular)  AMBULATORY STATUS COMMUNICATION OF NEEDS Skin   Extensive Assist Verbally Normal                       Personal Care Assistance Level of Assistance  Bathing, Feeding, Dressing Bathing Assistance: Maximum assistance Feeding assistance: Limited assistance Dressing Assistance: Maximum assistance     Functional Limitations Info   Sight, Hearing, Speech Sight Info: Adequate Hearing Info: Adequate Speech Info: Adequate    SPECIAL CARE FACTORS FREQUENCY  PT (By licensed PT), OT (By licensed OT)     PT Frequency: x5/week OT Frequency: x5/week            Contractures Contractures Info: Not present    Additional Factors Info  Code Status, Allergies Code Status Info: Full Allergies Info: No Known Allergies           Current Medications (11/23/2023):  This is the current hospital active medication list Current Facility-Administered Medications  Medication Dose Route Frequency Provider Last Rate Last Admin   acetaminophen  (TYLENOL ) tablet 650 mg  650 mg Oral Q4H PRN Ula Prentice SAUNDERS, MD       ciprofloxacin  (CIPRO ) tablet 500 mg  500 mg Oral Once Sherrilee Belvie CROME, MD       doxycycline  (VIBRA -TABS) tablet 100 mg  100 mg Oral Q12H Ula Prentice SAUNDERS, MD   100 mg at 11/23/23 1004   HYDROcodone -acetaminophen  (NORCO/VICODIN) 5-325 MG per tablet 1 tablet  1 tablet Oral Q6H PRN Ula Prentice SAUNDERS, MD   1 tablet at 11/23/23 1004   losartan  (COZAAR ) tablet 100 mg  100 mg Oral QHS Ula Prentice SAUNDERS, MD   100 mg at 11/22/23 2208   metoprolol  succinate (TOPROL -XL) 24 hr tablet 50 mg  50 mg Oral QHS Ula Prentice SAUNDERS, MD   50 mg at 11/22/23 2208   rivaroxaban  (XARELTO ) tablet 20 mg  20 mg Oral QHS Ula Prentice SAUNDERS, MD   20 mg at 11/22/23 2208  Current Outpatient Medications  Medication Sig Dispense Refill   oxyCODONE -acetaminophen  (PERCOCET/ROXICET) 5-325 MG tablet Take 1 tablet by mouth every 6 (six) hours as needed for severe pain (pain score 7-10). 15 tablet 0   Ascorbic Acid (VITAMIN C) 100 MG tablet Take 100 mg by mouth daily.     chlorthalidone (HYGROTON) 25 MG tablet Take 25 mg by mouth daily.     cholecalciferol  (VITAMIN D3) 25 MCG (1000 UT) tablet Take 1,000 Units by mouth daily.     conjugated estrogens  (PREMARIN ) vaginal cream Place 1 Applicatorful vaginally every other day. (Patient not taking: Reported on 10/13/2020) 42.5 g 12    doxycycline  (VIBRA -TABS) 100 MG tablet Take 100 mg by mouth daily.     ELDERBERRY PO Take by mouth.     furosemide (LASIX) 20 MG tablet Take 20 mg by mouth daily as needed.     HYDROcodone -acetaminophen  (NORCO/VICODIN) 5-325 MG tablet Take 1 tablet by mouth every 6 (six) hours as needed for severe pain. 10 tablet 0   latanoprost  (XALATAN ) 0.005 % ophthalmic solution Place 1 drop into both eyes at bedtime.     meclizine  (ANTIVERT ) 12.5 MG tablet Take 12.5 mg by mouth 3 (three) times daily as needed for dizziness or nausea.      metoprolol  succinate (TOPROL -XL) 25 MG 24 hr tablet Take by mouth.     rivaroxaban  (XARELTO ) 20 MG TABS tablet Take 20 mg by mouth daily.     zinc  gluconate 50 MG tablet Take 50 mg by mouth daily.       Discharge Medications: Please see discharge summary for a list of discharge medications.  Relevant Imaging Results:  Relevant Lab Results:   Additional Information SSN: [id number]  Kari JONETTA Daisy, LCSW

## 2023-11-24 DIAGNOSIS — Z7901 Long term (current) use of anticoagulants: Secondary | ICD-10-CM | POA: Diagnosis not present

## 2023-11-24 DIAGNOSIS — S42411A Displaced simple supracondylar fracture without intercondylar fracture of right humerus, initial encounter for closed fracture: Secondary | ICD-10-CM | POA: Diagnosis not present

## 2023-11-24 DIAGNOSIS — Z79899 Other long term (current) drug therapy: Secondary | ICD-10-CM | POA: Diagnosis not present

## 2023-11-24 DIAGNOSIS — M25521 Pain in right elbow: Secondary | ICD-10-CM | POA: Diagnosis not present

## 2023-11-24 DIAGNOSIS — M25561 Pain in right knee: Secondary | ICD-10-CM | POA: Diagnosis not present

## 2023-11-24 DIAGNOSIS — S0990XA Unspecified injury of head, initial encounter: Secondary | ICD-10-CM | POA: Diagnosis not present

## 2023-11-24 NOTE — Progress Notes (Signed)
 Physical Therapy Treatment Patient Details Name: Diana [name] MRN: [id number] DOB: 13-Dec-1933 Today's Date: 11/24/2023   History of Present Illness 89 y.o. F admitted on 11/22/23 due to fall at home. X-Ray's indicate a R distal humerus fracture. PMH significant for htn.    PT Comments  Patient demonstrates slow labored movement for sitting up at bedside with c/o severe pain in RUE and right knee, once seated able maintain sitting balance with RUE propped up on pillows, unable to stand due to weakness, pain and tolerated completing BLE exercises requiring occasional active assistance for RLE.  Patient attempted sit to stand using LUE holding onto RW, but unable to weakness.  Patient required Mod/max assist and fair return for using BUE and LUE to help reposition herself in bed with bed in head down position. Patient will benefit from continued skilled physical therapy in hospital and recommended venue below to increase strength, balance, endurance for safe ADLs and gait.      If plan is discharge home, recommend the following: A lot of help with walking and/or transfers;A lot of help with bathing/dressing/bathroom;Assistance with cooking/housework;Help with stairs or ramp for entrance   Can travel by private vehicle     No  Equipment Recommendations  None recommended by PT    Recommendations for Other Services       Precautions / Restrictions Precautions Precautions: Fall;Shoulder Type of Shoulder Precautions: R distal humerus fracture Shoulder Interventions: Shoulder sling/immobilizer Restrictions Weight Bearing Restrictions Per Provider Order: Yes RUE Weight Bearing Per Provider Order: Non weight bearing     Mobility  Bed Mobility Overal bed mobility: Needs Assistance Bed Mobility: Supine to Sit, Sit to Supine     Supine to sit: Mod assist, Max assist, +2 for physical assistance Sit to supine: Mod assist, Max assist        Transfers                         Ambulation/Gait                   Stairs             Wheelchair Mobility     Tilt Bed    Modified Rankin (Stroke Patients Only)       Balance Overall balance assessment: Needs assistance Sitting-balance support: Feet supported, Single extremity supported Sitting balance-Leahy Scale: Poor Sitting balance - Comments: fair/poor seated at EOB Postural control: Posterior lean                                  Cognition Arousal: Alert Behavior During Therapy: WFL for tasks assessed/performed, Anxious Overall Cognitive Status: Within Functional Limits for tasks assessed                                          Exercises      General Comments        Pertinent Vitals/Pain Pain Assessment Pain Assessment: Faces Faces Pain Scale: Hurts whole lot Pain Location: RUE and RLE Pain Descriptors / Indicators: Aching, Constant, Discomfort, Grimacing, Guarding Pain Intervention(s): Limited activity within patient's tolerance, Monitored during session, Repositioned    Home Living                          Prior  Function            PT Goals (current goals can now be found in the care plan section) Acute Rehab PT Goals Patient Stated Goal: return home after rehab PT Goal Formulation: With patient Time For Goal Achievement: 12/07/23 Progress towards PT goals: Progressing toward goals    Frequency    Min 2X/week      PT Plan      Co-evaluation              AM-PAC PT 6 Clicks Mobility   Outcome Measure  Help needed turning from your back to your side while in a flat bed without using bedrails?: A Lot Help needed moving from lying on your back to sitting on the side of a flat bed without using bedrails?: A Lot Help needed moving to and from a bed to a chair (including a wheelchair)?: Total Help needed standing up from a chair using your arms (e.g., wheelchair or bedside chair)?: Total Help needed to  walk in hospital room?: Total Help needed climbing 3-5 steps with a railing? : Total 6 Click Score: 8    End of Session   Activity Tolerance: Patient tolerated treatment well Patient left: in bed Nurse Communication: Mobility status PT Visit Diagnosis: Unsteadiness on feet (R26.81);Other abnormalities of gait and mobility (R26.89);Muscle weakness (generalized) (M62.81) Pain - Right/Left: Right Pain - part of body: Arm;Knee     Time: 1035-1100 PT Time Calculation (min) (ACUTE ONLY): 25 min  Charges:    $Therapeutic Exercise: 8-22 mins $Therapeutic Activity: 8-22 mins PT General Charges $$ ACUTE PT VISIT: 1 Visit                     2:04 PM, 11/24/23 Lynwood Music, MPT Physical Therapist with [facility] [phone] office [phone] mobile phone

## 2023-11-24 NOTE — ED Notes (Signed)
 CSW spoke with family and patient at bedside and provided them with an update. [employer] rep did verify that patient benefits were active and that Auth would need to be started with them. CSW spoke with accepting facility and alison with [facility] stated that she will start Auth today. TOC will continue to follow and give update to family once approved.

## 2023-11-24 NOTE — ED Notes (Signed)
 CSW spoke with patient and husband at bedside regarding patient insurance and not being able to start Auth for SNF. CSW was made aware that patient policy was terminated 11/22/2023. Family made aware, patient stated that she does not know why it would say terminated. Husband called daughter, daughter shared what cards she seen and that all had 2024. Pt then told her daughter to go to her house to look through mail. CSW did advise family to call insurance company to see what is going on . TOC will continue to follow.

## 2023-11-24 NOTE — ED Provider Notes (Signed)
 Emergency Medicine Observation Re-evaluation Note  Diana [name] is a 89 y.o. female, seen on rounds today.  Pt initially presented to the ED for complaints of Fall Currently, the patient is sleeping.  Physical Exam  BP 139/76   Pulse 83   Temp 97.8 F (36.6 C) (Oral)   Resp 16   SpO2 95%  Physical Exam General: No acute distress Cardiac: Well-perfused Lungs: Nonlabored Psych: Calm  ED Course / MDM  EKG:   I have reviewed the labs performed to date as well as medications administered while in observation.  Recent changes in the last 24 hours include social work working on placement.  Plan  Current plan is for possible placement [facility] rehab.    Towana Ozell BROCKS, MD 11/24/23 0730

## 2023-11-24 NOTE — ED Notes (Signed)
 Pt given a drink, no other requests at this time.

## 2023-11-24 NOTE — Plan of Care (Signed)
  Problem: Acute Rehab PT Goals(only PT should resolve) Goal: Pt Will Go Supine/Side To Sit Outcome: Progressing Flowsheets (Taken 11/24/2023 1406) Pt will go Supine/Side to Sit: with moderate assist Goal: Patient Will Transfer Sit To/From Stand Outcome: Progressing Flowsheets (Taken 11/24/2023 1406) Patient will transfer sit to/from stand:  with moderate assist  with maximum assist Goal: Pt Will Transfer Bed To Chair/Chair To Bed Outcome: Progressing Flowsheets (Taken 11/24/2023 1406) Pt will Transfer Bed to Chair/Chair to Bed:  with mod assist  with max assist Goal: Pt Will Ambulate Outcome: Progressing Flowsheets (Taken 11/24/2023 1406) Pt will Ambulate:  10 feet  with moderate assist  with maximum assist  with rolling walker   2:07 PM, 11/24/23 Lynwood Music, MPT Physical Therapist with [facility] [phone] office [phone] mobile phone

## 2023-11-24 NOTE — ED Notes (Signed)
 Pt given a blanket, no other requests at this time.

## 2023-11-25 DIAGNOSIS — M25521 Pain in right elbow: Secondary | ICD-10-CM | POA: Diagnosis not present

## 2023-11-25 DIAGNOSIS — S42411A Displaced simple supracondylar fracture without intercondylar fracture of right humerus, initial encounter for closed fracture: Secondary | ICD-10-CM | POA: Diagnosis not present

## 2023-11-25 DIAGNOSIS — S0990XA Unspecified injury of head, initial encounter: Secondary | ICD-10-CM | POA: Diagnosis not present

## 2023-11-25 DIAGNOSIS — Z7901 Long term (current) use of anticoagulants: Secondary | ICD-10-CM | POA: Diagnosis not present

## 2023-11-25 DIAGNOSIS — M25561 Pain in right knee: Secondary | ICD-10-CM | POA: Diagnosis not present

## 2023-11-25 DIAGNOSIS — Z79899 Other long term (current) drug therapy: Secondary | ICD-10-CM | POA: Diagnosis not present

## 2023-11-25 NOTE — ED Provider Notes (Signed)
 Emergency Medicine Observation Re-evaluation Note  Diana [name] is a 89 y.o. female, seen on rounds today.  Pt initially presented to the ED for complaints of Fall Currently, the patient is sleeping.  Physical Exam  BP 132/81   Pulse 80   Temp 98.2 F (36.8 C) (Oral)   Resp 18   SpO2 95%  Physical Exam General: No acute distress Cardiac: Well-perfused Lungs: Nonlabored Psych: Calm  ED Course / MDM  EKG:   I have reviewed the labs performed to date as well as medications administered while in observation.  Recent changes in the last 24 hours include social work working on placement.  Plan  Current plan is for placement.  Patient trying to get her insurance confirmed.    Towana Ozell BROCKS, MD 11/25/23 [phone]

## 2023-11-25 NOTE — ED Notes (Signed)
 CSW reached out to [place] with [facility] SN[facility] to check on Auth . Auth has not came back yet.  Family made aware. TOC will continue to follow.

## 2023-11-26 DIAGNOSIS — S0990XA Unspecified injury of head, initial encounter: Secondary | ICD-10-CM | POA: Diagnosis not present

## 2023-11-26 DIAGNOSIS — M25521 Pain in right elbow: Secondary | ICD-10-CM | POA: Diagnosis not present

## 2023-11-26 DIAGNOSIS — Z7901 Long term (current) use of anticoagulants: Secondary | ICD-10-CM | POA: Diagnosis not present

## 2023-11-26 DIAGNOSIS — Z79899 Other long term (current) drug therapy: Secondary | ICD-10-CM | POA: Diagnosis not present

## 2023-11-26 DIAGNOSIS — M25561 Pain in right knee: Secondary | ICD-10-CM | POA: Diagnosis not present

## 2023-11-26 DIAGNOSIS — S42411A Displaced simple supracondylar fracture without intercondylar fracture of right humerus, initial encounter for closed fracture: Secondary | ICD-10-CM | POA: Diagnosis not present

## 2023-11-26 MED ORDER — MELATONIN 3 MG PO TABS
3.0000 mg | ORAL_TABLET | Freq: Every day | ORAL | Status: DC
  Administered 2023-11-26 – 2023-12-02 (×8): 3 mg via ORAL
  Filled 2023-11-26 (×8): qty 1

## 2023-11-26 NOTE — ED Provider Notes (Signed)
 Emergency Medicine Observation Re-evaluation Note  Diana [name] is a 89 y.o. female, patient is holding in the ED for possible placement Physical Exam  BP (!) 155/105   Pulse 92   Temp 97.8 F (36.6 C) (Oral)   Resp 18   SpO2 96%  Physical Exam General: No acute distress resting  ED Course / MDM  EKG:   I have reviewed the labs performed to date as well as medications administered while in observation.  Recent changes in the last 24 hours include no events.  Plan  Current plan is for placement.    Randol Simmonds, MD 11/26/23 1026

## 2023-11-26 NOTE — ED Notes (Signed)
 Pt repositioned in bed. Pt complaining of itching and soreness on right side buttock area. Pt repositioned off R side to prevent pressure injury. Pt calm and asleep at this time

## 2023-11-27 DIAGNOSIS — S42411A Displaced simple supracondylar fracture without intercondylar fracture of right humerus, initial encounter for closed fracture: Secondary | ICD-10-CM | POA: Diagnosis not present

## 2023-11-27 DIAGNOSIS — Z79899 Other long term (current) drug therapy: Secondary | ICD-10-CM | POA: Diagnosis not present

## 2023-11-27 DIAGNOSIS — M25561 Pain in right knee: Secondary | ICD-10-CM | POA: Diagnosis not present

## 2023-11-27 DIAGNOSIS — S0990XA Unspecified injury of head, initial encounter: Secondary | ICD-10-CM | POA: Diagnosis not present

## 2023-11-27 DIAGNOSIS — M25521 Pain in right elbow: Secondary | ICD-10-CM | POA: Diagnosis not present

## 2023-11-27 DIAGNOSIS — Z7901 Long term (current) use of anticoagulants: Secondary | ICD-10-CM | POA: Diagnosis not present

## 2023-11-27 NOTE — ED Provider Notes (Signed)
  Physical Exam  BP (!) 147/109   Pulse (!) 107   Temp 98 F (36.7 C) (Oral)   Resp 18   SpO2 96%   Physical Exam  Procedures  Procedures  ED Course / MDM   Clinical Course as of 11/27/23 0733  Tue Nov 22, 2023  1439 DG Forearm Right Appears to have supracondylar fracture.  Paging Ortho for possible intervention [TY]  1449 Spoke with Dr. Margrette with ortho.  He reviewed images.  No need for acute intervention at this time.  Recommending posterior long-arm splint, sling pain control and patient to follow-up in clinic. [TY]  1507 DG Forearm Right IMPRESSION: 1. Displaced distal humeral fracture with joint effusion. 2. No fracture of the forearm.   [TY]    Clinical Course User Index [TY] Neysa Caron PARAS, DO   Medical Decision Making Amount and/or Complexity of Data Reviewed Labs: ordered. Radiology: ordered. Decision-making details documented in ED Course.  Risk OTC drugs. Prescription drug management.   Pending placement to [facility] rehab.  Appears as if waiting on authorization.       Patsey Lot, MD 11/27/23 [phone]

## 2023-11-27 NOTE — ED Notes (Signed)
 Changed pts bed and is now in a hospital bed. Pt is now repositioned and resting comfortably at this time.

## 2023-11-28 DIAGNOSIS — M25561 Pain in right knee: Secondary | ICD-10-CM | POA: Diagnosis not present

## 2023-11-28 DIAGNOSIS — Z7901 Long term (current) use of anticoagulants: Secondary | ICD-10-CM | POA: Diagnosis not present

## 2023-11-28 DIAGNOSIS — M25521 Pain in right elbow: Secondary | ICD-10-CM | POA: Diagnosis not present

## 2023-11-28 DIAGNOSIS — S0990XA Unspecified injury of head, initial encounter: Secondary | ICD-10-CM | POA: Diagnosis not present

## 2023-11-28 DIAGNOSIS — Z79899 Other long term (current) drug therapy: Secondary | ICD-10-CM | POA: Diagnosis not present

## 2023-11-28 DIAGNOSIS — S42411A Displaced simple supracondylar fracture without intercondylar fracture of right humerus, initial encounter for closed fracture: Secondary | ICD-10-CM | POA: Diagnosis not present

## 2023-11-28 NOTE — ED Notes (Signed)
 Pt breakfast tray sat up and given to pt.

## 2023-11-28 NOTE — ED Notes (Addendum)
 CSW spoke with Diana [name] at [facility] this morning who requested updated PT/OT notes to send over to patient insurance . Diana [name] stated that Shara is still pending. CSW will notify pt and family. CSW will continue to follow.    CSW spoke with spouse and patient at bedside about Auth pending. CSW waiting for pt to be seen by PT/OT to send Diana [name] updated notes.

## 2023-11-28 NOTE — ED Notes (Signed)
 CSW sent updated therapy notes to Jill Side at [facility] through the [facility] to send to insurance. TOC will continue to follow.

## 2023-11-28 NOTE — Progress Notes (Signed)
 Physical Therapy Treatment Patient Details Name: Diana [name] MRN: [id number] DOB: [birth year] Today's Date: 11/28/2023   History of Present Illness 89 y.o. F admitted on 11/22/23 due to fall at home. X-Ray's indicate a R distal humerus fracture. PMH significant for htn.    PT Comments  Patient demonstrates slow labored movement for sitting up at bedside with c/o increased RUE and right knee pain, once seated had frequent leaning to the left, occasional spasticity of legs once fatigued and able to lift bottom off bed during attempts of sit to stands.  Patient unable to complete sit to stands or transfers due to BLE weakness, right knee, RUE pain.  Patient put back to bed with Max assist for repositioning.  Patient will benefit from continued skilled physical therapy in hospital and recommended venue below to increase strength, balance, endurance for safe ADLs and gait.     If plan is discharge home, recommend the following: A lot of help with walking and/or transfers;A lot of help with bathing/dressing/bathroom;Assistance with cooking/housework;Help with stairs or ramp for entrance   Can travel by private vehicle     No  Equipment Recommendations  None recommended by PT    Recommendations for Other Services       Precautions / Restrictions Precautions Precautions: Fall;Shoulder Type of Shoulder Precautions: R distal humerus fracture Shoulder Interventions: Shoulder sling/immobilizer Restrictions Weight Bearing Restrictions Per Provider Order: Yes RUE Weight Bearing Per Provider Order: Non weight bearing     Mobility  Bed Mobility Overal bed mobility: Needs Assistance Bed Mobility: Supine to Sit, Sit to Supine     Supine to sit: Max assist Sit to supine: Max assist   General bed mobility comments: slow labored movement with c/o increased pain RUE with movement    Transfers                        Ambulation/Gait                   Stairs              Wheelchair Mobility     Tilt Bed    Modified Rankin (Stroke Patients Only)       Balance Overall balance assessment: Needs assistance Sitting-balance support: Feet supported, Single extremity supported Sitting balance-Leahy Scale: Poor Sitting balance - Comments: fair/poor seated at EOB Postural control: Left lateral lean                                  Cognition Arousal: Alert Behavior During Therapy: WFL for tasks assessed/performed Overall Cognitive Status: Within Functional Limits for tasks assessed                                          Exercises General Exercises - Lower Extremity Ankle Circles/Pumps: AROM, Strengthening, Both, 10 reps, Seated Long Arc Quad: AAROM, Strengthening, Both, 10 reps, Seated Hip Flexion/Marching: AAROM, Strengthening, Both, 10 reps, Seated    General Comments        Pertinent Vitals/Pain Pain Assessment Pain Assessment: Faces Pain Score: 8  Faces Pain Scale: Hurts even more Pain Location: RUE Pain Descriptors / Indicators: Discomfort, Grimacing, Guarding, Sore, Sharp Pain Intervention(s): Limited activity within patient's tolerance, Monitored during session, Repositioned    Home Living  Prior Function            PT Goals (current goals can now be found in the care plan section) Acute Rehab PT Goals Patient Stated Goal: return home after rehab PT Goal Formulation: With patient/family Time For Goal Achievement: 12/07/23 Potential to Achieve Goals: Good Progress towards PT goals: Progressing toward goals    Frequency    Min 2X/week      PT Plan      Co-evaluation PT/OT/SLP Co-Evaluation/Treatment: Yes Reason for Co-Treatment: Complexity of the patient's impairments (multi-system involvement);To address functional/ADL transfers PT goals addressed during session: Mobility/safety with mobility;Balance;Proper use of DME        AM-PAC PT  6 Clicks Mobility   Outcome Measure  Help needed turning from your back to your side while in a flat bed without using bedrails?: A Lot Help needed moving from lying on your back to sitting on the side of a flat bed without using bedrails?: A Lot Help needed moving to and from a bed to a chair (including a wheelchair)?: A Lot Help needed standing up from a chair using your arms (e.g., wheelchair or bedside chair)?: A Lot Help needed to walk in hospital room?: A Lot Help needed climbing 3-5 steps with a railing? : A Lot 6 Click Score: 12    End of Session   Activity Tolerance: Patient tolerated treatment well;Patient limited by fatigue;Patient limited by pain Patient left: in bed;with call bell/phone within reach;with family/visitor present Nurse Communication: Mobility status PT Visit Diagnosis: Unsteadiness on feet (R26.81);Muscle weakness (generalized) (M62.81);Other abnormalities of gait and mobility (R26.89) Pain - Right/Left: Right Pain - part of body: Arm;Knee     Time: 8656-8587 PT Time Calculation (min) (ACUTE ONLY): 29 min  Charges:    $Therapeutic Exercise: 8-22 mins $Therapeutic Activity: 8-22 mins PT General Charges $$ ACUTE PT VISIT: 1 Visit                     3:05 PM, 11/28/23 Lynwood Music, MPT Physical Therapist with [facility] [phone] office [phone] mobile phone

## 2023-11-28 NOTE — ED Provider Notes (Signed)
 Emergency Medicine Observation Re-evaluation Note  Diana [name] is a 89 y.o. female, seen on rounds today.  Pt initially presented to the ED for complaints of Fall Currently, the patient is resting comfortably.  Physical Exam  BP (!) 158/75   Pulse 98   Temp 98.1 F (36.7 C) (Oral)   Resp 15   SpO2 97%  Physical Exam Vitals and nursing note reviewed.  Constitutional:      General: She is not in acute distress.    Appearance: She is well-developed.  HENT:     Head: Normocephalic and atraumatic.  Eyes:     Conjunctiva/sclera: Conjunctivae normal.  Pulmonary:     Effort: Pulmonary effort is normal. No respiratory distress.  Musculoskeletal:        General: Tenderness (Splint in place) present. No swelling.     Cervical back: Neck supple.  Skin:    General: Skin is warm and dry.     Capillary Refill: Capillary refill takes less than 2 seconds.  Neurological:     Mental Status: She is alert.  Psychiatric:        Mood and Affect: Mood normal.      ED Course / MDM  EKG:   I have reviewed the labs performed to date as well as medications administered while in observation.  Recent changes in the last 24 hours include none.  Plan  Current plan is for placement.    Albertina Dixon, MD 11/28/23 [phone]

## 2023-11-28 NOTE — ED Notes (Signed)
 Patient given a sandwhich and drink

## 2023-11-28 NOTE — Progress Notes (Signed)
 Occupational Therapy Treatment Patient Details Name: Diana [name] MRN: [id number] DOB: 1933/12/13 Today's Date: 11/28/2023   History of present illness 89 y.o. F admitted on 11/22/23 due to fall at home. X-Ray's indicate a R distal humerus fracture. PMH significant for htn.   OT comments  Pt seen this session to work on functional mobility and exercises, in order to improver overall endurance and strength. At this time she presents with increased weakness, poor balance sitting EOB, and difficulty following tasks due to fear. She was able to sit EOB for approximately 20 mins, however she leaned heavily to the L side and despite max cuing was unable to self correct or correct with heavy hands on assist. Pt will continue to benefit from skilled OT to maximize her independence and safety, as well as reducing caregiver burden.       If plan is discharge home, recommend the following:  Two people to help with walking and/or transfers;Two people to help with bathing/dressing/bathroom;Direct supervision/assist for medications management;Assistance with cooking/housework;Assist for transportation;Help with stairs or ramp for entrance   Equipment Recommendations  Tub/shower seat    Recommendations for Other Services      Precautions / Restrictions Precautions Precautions: Fall;Shoulder Type of Shoulder Precautions: R distal humerus fracture Shoulder Interventions: Shoulder sling/immobilizer Restrictions Weight Bearing Restrictions Per Provider Order: Yes RUE Weight Bearing Per Provider Order: Non weight bearing       Mobility Bed Mobility Overal bed mobility: Needs Assistance Bed Mobility: Supine to Sit, Sit to Supine     Supine to sit: Max assist Sit to supine: Max assist   General bed mobility comments: slow labored movement with c/o increased pain RUE with movement    Transfers                         Balance Overall balance assessment: Needs  assistance Sitting-balance support: Feet supported, Single extremity supported Sitting balance-Leahy Scale: Poor Sitting balance - Comments: fair/poor seated at EOB Postural control: Left lateral lean                                 ADL either performed or assessed with clinical judgement   ADL                                              Extremity/Trunk Assessment Upper Extremity Assessment Upper Extremity Assessment: RUE deficits/detail RUE Deficits / Details: NWB due to distal humerus fracture RUE Coordination: decreased gross motor;decreased fine motor   Lower Extremity Assessment Lower Extremity Assessment: Defer to PT evaluation        Vision       Perception     Praxis      Cognition Arousal: Alert Behavior During Therapy: WFL for tasks assessed/performed Overall Cognitive Status: Within Functional Limits for tasks assessed                                          Exercises Exercises: Other exercises Other Exercises Other Exercises: Shoulder shrugs x10 Other Exercises: scapular retraction x10 Other Exercises: wiggling RUE fingers    Shoulder Instructions       General Comments      Pertinent Vitals/  Pain       Pain Assessment Pain Assessment: Faces Pain Score: 8  Faces Pain Scale: Hurts even more Pain Location: RUE Pain Descriptors / Indicators: Discomfort, Grimacing, Guarding, Sore, Sharp Pain Intervention(s): Limited activity within patient's tolerance, Monitored during session, Repositioned  Home Living                                          Prior Functioning/Environment              Frequency  Min 1X/week        Progress Toward Goals  OT Goals(current goals can now be found in the care plan section)  Progress towards OT goals: Progressing toward goals  Acute Rehab OT Goals Patient Stated Goal: To get better OT Goal Formulation: With patient Time For Goal  Achievement: 12/07/23 Potential to Achieve Goals: Fair ADL Goals Pt Will Perform Grooming: with supervision;sitting Pt Will Perform Upper Body Bathing: with min assist;sitting Pt Will Perform Lower Body Bathing: with mod assist;sitting/lateral leans Pt Will Perform Upper Body Dressing: with min assist;sitting Pt Will Perform Lower Body Dressing: with mod assist;sitting/lateral leans Pt Will Transfer to Toilet: with mod assist;stand pivot transfer Pt Will Perform Toileting - Clothing Manipulation and hygiene: with mod assist;sitting/lateral leans  Plan      Co-evaluation    PT/OT/SLP Co-Evaluation/Treatment: Yes Reason for Co-Treatment: Complexity of the patient's impairments (multi-system involvement);To address functional/ADL transfers PT goals addressed during session: Mobility/safety with mobility;Balance;Proper use of DME OT goals addressed during session: ADL's and self-care;Strengthening/ROM      AM-PAC OT 6 Clicks Daily Activity     Outcome Measure   Help from another person eating meals?: A Little Help from another person taking care of personal grooming?: A Little Help from another person toileting, which includes using toliet, bedpan, or urinal?: Total Help from another person bathing (including washing, rinsing, drying)?: A Lot Help from another person to put on and taking off regular upper body clothing?: A Lot Help from another person to put on and taking off regular lower body clothing?: Total 6 Click Score: 12    End of Session Equipment Utilized During Treatment: Oxygen   OT Visit Diagnosis: Unsteadiness on feet (R26.81);Other abnormalities of gait and mobility (R26.89);Muscle weakness (generalized) (M62.81);Pain Pain - Right/Left: Right Pain - part of body: Arm;Knee   Activity Tolerance Patient tolerated treatment well;Patient limited by pain   Patient Left in bed;with call bell/phone within reach   Nurse Communication Mobility status        Time:  1350-1414 OT Time Calculation (min): 24 min  Charges: OT General Charges $OT Visit: 1 Visit OT Treatments $Therapeutic Activity: 8-22 mins  Valentin Nightingale, OTR/L [facility] Acute Rehab  Kanda Deluna Elane Nightingale 11/28/2023, 3:13 PM

## 2023-11-29 DIAGNOSIS — S0990XA Unspecified injury of head, initial encounter: Secondary | ICD-10-CM | POA: Diagnosis not present

## 2023-11-29 DIAGNOSIS — M25521 Pain in right elbow: Secondary | ICD-10-CM | POA: Diagnosis not present

## 2023-11-29 DIAGNOSIS — M25561 Pain in right knee: Secondary | ICD-10-CM | POA: Diagnosis not present

## 2023-11-29 DIAGNOSIS — Z7901 Long term (current) use of anticoagulants: Secondary | ICD-10-CM | POA: Diagnosis not present

## 2023-11-29 DIAGNOSIS — S42411A Displaced simple supracondylar fracture without intercondylar fracture of right humerus, initial encounter for closed fracture: Secondary | ICD-10-CM | POA: Diagnosis not present

## 2023-11-29 DIAGNOSIS — Z79899 Other long term (current) drug therapy: Secondary | ICD-10-CM | POA: Diagnosis not present

## 2023-11-29 LAB — URINALYSIS, ROUTINE W REFLEX MICROSCOPIC
Bilirubin Urine: NEGATIVE
Glucose, UA: NEGATIVE mg/dL
Ketones, ur: NEGATIVE mg/dL
Nitrite: NEGATIVE
Protein, ur: 30 mg/dL — AB
Specific Gravity, Urine: 1.029 (ref 1.005–1.030)
WBC, UA: >50 WBC/hpf (ref 0–5)
pH: 5 (ref 5.0–8.0)

## 2023-11-29 MED ORDER — BISACODYL 5 MG PO TBEC
5.0000 mg | DELAYED_RELEASE_TABLET | Freq: Every day | ORAL | Status: DC
  Administered 2023-11-29 – 2023-12-03 (×5): 5 mg via ORAL
  Filled 2023-11-29 (×5): qty 1

## 2023-11-29 NOTE — ED Notes (Signed)
 NT asked this RN to assess new wound/sore found while changing pt. Sore is on upper right inner thigh and is open and red, no infection noted. Cream placed on pt to help with friction. New sacral pad applied to pt buttocks to help prevent skin breakdown. This RN told primary RN and to pass along this information to oncoming nurses.

## 2023-11-29 NOTE — ED Notes (Signed)
 This tech and another tech [place]) gave pt a bed bath with full linen change. Dentures were cleaned. Brief and new purewick has been changed. Purewick looked as though it had not been changed in days. Urine has a foul smell and dark in color. RN made aware. Pt has a small new wound in the inner though. RN changed the sacral pad on the pts bottom. Lotion and barrier cream placed in appropriate areas. Pt has been tucked in and resting comfortable at this time.

## 2023-11-29 NOTE — ED Notes (Signed)
 CSW spoke with Jill Side at [facility] and patient Diana [name] is still pending. TOC will continue to follow.

## 2023-11-30 DIAGNOSIS — Z7901 Long term (current) use of anticoagulants: Secondary | ICD-10-CM | POA: Diagnosis not present

## 2023-11-30 DIAGNOSIS — Z79899 Other long term (current) drug therapy: Secondary | ICD-10-CM | POA: Diagnosis not present

## 2023-11-30 DIAGNOSIS — S42411A Displaced simple supracondylar fracture without intercondylar fracture of right humerus, initial encounter for closed fracture: Secondary | ICD-10-CM | POA: Diagnosis not present

## 2023-11-30 DIAGNOSIS — M25521 Pain in right elbow: Secondary | ICD-10-CM | POA: Diagnosis not present

## 2023-11-30 DIAGNOSIS — M25561 Pain in right knee: Secondary | ICD-10-CM | POA: Diagnosis not present

## 2023-11-30 DIAGNOSIS — S0990XA Unspecified injury of head, initial encounter: Secondary | ICD-10-CM | POA: Diagnosis not present

## 2023-11-30 MED ORDER — DOUBLE ANTIBIOTIC 500-10000 UNIT/GM EX OINT
TOPICAL_OINTMENT | Freq: Two times a day (BID) | CUTANEOUS | Status: DC
  Administered 2023-12-02: 1 via TOPICAL
  Filled 2023-11-30 (×8): qty 1

## 2023-11-30 MED ORDER — CEFDINIR 300 MG PO CAPS
300.0000 mg | ORAL_CAPSULE | Freq: Two times a day (BID) | ORAL | Status: DC
  Administered 2023-11-30 – 2023-12-03 (×8): 300 mg via ORAL
  Filled 2023-11-30 (×8): qty 1

## 2023-11-30 MED ORDER — DOCUSATE SODIUM 100 MG PO CAPS
200.0000 mg | ORAL_CAPSULE | Freq: Two times a day (BID) | ORAL | Status: DC
  Administered 2023-11-30 – 2023-12-03 (×6): 200 mg via ORAL
  Filled 2023-11-30 (×6): qty 2

## 2023-11-30 MED ORDER — POLYETHYLENE GLYCOL 3350 17 G PO PACK
17.0000 g | PACK | Freq: Once | ORAL | Status: AC
  Administered 2023-11-30: 17 g via ORAL
  Filled 2023-11-30: qty 1

## 2023-11-30 MED ORDER — BACITRACIN ZINC 500 UNIT/GM EX OINT
TOPICAL_OINTMENT | CUTANEOUS | Status: AC
  Filled 2023-11-30: qty 0.9

## 2023-11-30 NOTE — ED Notes (Addendum)
 Fed patient about 2/3 container of applesauce, and 3/4 of a small cookie with her orange juice, and several sips of water. Patient said she has not had a bowel movement since she has been here at [facility]. She is also complaining of pain where her purewick is located.

## 2023-11-30 NOTE — ED Notes (Signed)
 Placed new cast on patient and checked circulation pt complaining old cast was too tight.

## 2023-11-30 NOTE — ED Notes (Signed)
 Pt fed container of pears- ate 100%

## 2023-11-30 NOTE — ED Provider Notes (Signed)
 Patient has developed a new area of skin breakdown in the medial aspect of the proximal left thigh, apparently from where it was rubbing up against a pure wick.  This does not appear grossly infected, I have ordered topical bacitracin  and dressing to help protect the.  Also, urinalysis is come back suggestive of UTI with greater than 50 WBCs and many bacteria but negative nitrite.  I have ordered cefdinir  twice a day.   Raford Lenis, MD 11/30/23 [phone]

## 2023-11-30 NOTE — ED Notes (Signed)
 CSW called [facility] to check on Auth and alison shared that Berkley Harvey is still pending . CSW shared information with spouse. TOC will continue to follow.

## 2023-11-30 NOTE — Progress Notes (Signed)
 Physical Therapy Treatment Patient Details Name: Diana [name] MRN: [id number] DOB: 1934/01/28 Today's Date: 11/30/2023   History of Present Illness 89 y.o. F admitted on 11/22/23 due to fall at home. X-Ray's indicate a R distal humerus fracture. PMH significant for htn.    PT Comments  Patient demonstrates improvement for maintaining sitting balance while seated at EOB, tolerated BLE exercises, but required active assistance for completing hip raises and knee extension due to weakness.  Patient required Max assist for scooting laterally with limited use of LUE due to weakness and unable to stand or transfer to chair.  Patient required Mod/max assist for repositioning when put back to bed. Patient will benefit from continued skilled physical therapy in hospital and recommended venue below to increase strength, balance, endurance for safe ADLs and gait.     If plan is discharge home, recommend the following: A lot of help with walking and/or transfers;A lot of help with bathing/dressing/bathroom;Assistance with cooking/housework;Help with stairs or ramp for entrance   Can travel by private vehicle     No  Equipment Recommendations  None recommended by PT    Recommendations for Other Services       Precautions / Restrictions Precautions Precautions: Fall;Shoulder Type of Shoulder Precautions: R distal humerus fracture Shoulder Interventions: Shoulder sling/immobilizer Restrictions Weight Bearing Restrictions Per Provider Order: Yes RUE Weight Bearing Per Provider Order: Non weight bearing     Mobility  Bed Mobility Overal bed mobility: Needs Assistance Bed Mobility: Rolling, Supine to Sit, Sit to Supine Rolling: Mod assist   Supine to sit: Max assist Sit to supine: Mod assist, Max assist   General bed mobility comments: slow labored movement with difficulty leaning on left elbow during supine to sitting    Transfers                        Ambulation/Gait                    Stairs             Wheelchair Mobility     Tilt Bed    Modified Rankin (Stroke Patients Only)       Balance Overall balance assessment: Needs assistance Sitting-balance support: Feet supported, Single extremity supported Sitting balance-Leahy Scale: Fair Sitting balance - Comments: seated at EOB                                    Cognition Arousal: Alert Behavior During Therapy: WFL for tasks assessed/performed Overall Cognitive Status: Within Functional Limits for tasks assessed                                          Exercises General Exercises - Lower Extremity Ankle Circles/Pumps: AROM, Strengthening, Both, 10 reps, Seated Long Arc Quad: AAROM, Strengthening, Both, 10 reps, Seated Hip Flexion/Marching: AAROM, Strengthening, Both, 10 reps, Seated    General Comments        Pertinent Vitals/Pain Pain Assessment Pain Assessment: Faces Faces Pain Scale: Hurts little more Pain Location: RUE Pain Descriptors / Indicators: Discomfort, Grimacing, Guarding Pain Intervention(s): Limited activity within patient's tolerance, Monitored during session, Repositioned    Home Living  Prior Function            PT Goals (current goals can now be found in the care plan section) Acute Rehab PT Goals Patient Stated Goal: return home after rehab PT Goal Formulation: With patient Time For Goal Achievement: 12/07/23 Potential to Achieve Goals: Good Progress towards PT goals: Progressing toward goals    Frequency    Min 2X/week      PT Plan      Co-evaluation              AM-PAC PT 6 Clicks Mobility   Outcome Measure  Help needed turning from your back to your side while in a flat bed without using bedrails?: A Lot Help needed moving from lying on your back to sitting on the side of a flat bed without using bedrails?: A Lot Help needed moving to and from a bed  to a chair (including a wheelchair)?: A Lot Help needed standing up from a chair using your arms (e.g., wheelchair or bedside chair)?: A Lot Help needed to walk in hospital room?: A Lot Help needed climbing 3-5 steps with a railing? : A Lot 6 Click Score: 12    End of Session   Activity Tolerance: Patient tolerated treatment well Patient left: in bed;with call bell/phone within reach Nurse Communication: Mobility status PT Visit Diagnosis: Unsteadiness on feet (R26.81);Muscle weakness (generalized) (M62.81);Other abnormalities of gait and mobility (R26.89) Pain - Right/Left: Right Pain - part of body: Arm;Knee     Time: 8894-8864 PT Time Calculation (min) (ACUTE ONLY): 30 min  Charges:    $Therapeutic Exercise: 8-22 mins $Therapeutic Activity: 8-22 mins PT General Charges $$ ACUTE PT VISIT: 1 Visit                     2:33 PM, 11/30/23 Lynwood Music, MPT Physical Therapist with [facility] [phone] office [phone] mobile phone

## 2023-11-30 NOTE — ED Notes (Signed)
 Pt has been changed and cleaned up. Pt states she is feeling better now that she has had a BM. Repositioned in the bed and resting comfortably in bed. Requesting a snack to eat.

## 2023-11-30 NOTE — ED Notes (Signed)
 Pt placed on bed pan, this nurse observed large formed BM coming out of pt rectum, pt having trouble pushing out, this nurse assisted with disimpacting formed feces from rectum, pt left on bed pan with call bell in hand as pt continues having BM. Yolani, NT present and assisted with turning and moving pt.

## 2023-11-30 NOTE — ED Provider Notes (Signed)
 Emergency Medicine Observation Re-evaluation Note  Diana [name] is a 89 y.o. female, seen on rounds today.  Pt initially presented to the ED for complaints of Fall Currently, the patient is resting comfortably.  Physical Exam  BP 128/83   Pulse 91   Temp 99.5 F (37.5 C) (Rectal)   Resp 20   SpO2 96%  Physical Exam Vitals and nursing note reviewed.  Constitutional:      General: She is not in acute distress.    Appearance: She is well-developed.  HENT:     Head: Normocephalic and atraumatic.  Eyes:     Conjunctiva/sclera: Conjunctivae normal.  Pulmonary:     Effort: Pulmonary effort is normal. No respiratory distress.  Musculoskeletal:        General: No swelling.     Cervical back: Neck supple.  Skin:    General: Skin is warm and dry.     Capillary Refill: Capillary refill takes less than 2 seconds.  Neurological:     Mental Status: She is alert.  Psychiatric:        Mood and Affect: Mood normal.     ED Course / MDM  EKG:   I have reviewed the labs performed to date as well as medications administered while in observation.  Recent changes in the last 24 hours include area of skin breakdown developed overnight due to active retained pure wick.  Topical antibiotics placed and PureWick removed by overnight provider  Plan  Current plan is for placement.    Albertina Dixon, MD 11/30/23 [phone]

## 2023-11-30 NOTE — ED Notes (Signed)
 Pt has been repositioned in the bed. Pt resting comfortably at this time with call light in reach.

## 2023-11-30 NOTE — ED Notes (Signed)
 Pt c/o headache, pt has PRN order for tylenol.

## 2023-11-30 NOTE — ED Notes (Signed)
 Tried to feed patient. She refused came back at 10:18 and she refused again.

## 2023-11-30 NOTE — ED Notes (Signed)
 Pt repositioned in bed, pillows placed under thighs in attempt to decrease thigh/legs turning outward- heels floated.

## 2023-12-01 DIAGNOSIS — S0990XA Unspecified injury of head, initial encounter: Secondary | ICD-10-CM | POA: Diagnosis not present

## 2023-12-01 DIAGNOSIS — S42411A Displaced simple supracondylar fracture without intercondylar fracture of right humerus, initial encounter for closed fracture: Secondary | ICD-10-CM | POA: Diagnosis not present

## 2023-12-01 DIAGNOSIS — Z79899 Other long term (current) drug therapy: Secondary | ICD-10-CM | POA: Diagnosis not present

## 2023-12-01 DIAGNOSIS — Z7901 Long term (current) use of anticoagulants: Secondary | ICD-10-CM | POA: Diagnosis not present

## 2023-12-01 DIAGNOSIS — M25521 Pain in right elbow: Secondary | ICD-10-CM | POA: Diagnosis not present

## 2023-12-01 DIAGNOSIS — M25561 Pain in right knee: Secondary | ICD-10-CM | POA: Diagnosis not present

## 2023-12-01 NOTE — ED Provider Notes (Signed)
 Patient is boarding in the emergency department due to a right distal humerus fracture.  She appears to be growing E. coli and is currently on cefdinir .  Feel this is likely adequate antibiotic based on the culture results.  Will follow-up on susceptibilities.   Yolande Lamar BROCKS, MD 12/01/23 1535

## 2023-12-01 NOTE — ED Notes (Signed)
 Pt had another BM, peri and catheter care performed, new sacral patch applied to sacrum and brief changed. Pt repositioned

## 2023-12-01 NOTE — ED Notes (Signed)
Pt given a cup of water 

## 2023-12-01 NOTE — ED Notes (Signed)
 Pt repositioned in bed.

## 2023-12-01 NOTE — ED Notes (Signed)
 Asked patient if they wanted to eat. She said she wanted to sleep a little more and then she would.

## 2023-12-01 NOTE — ED Notes (Signed)
 Family at bedside.

## 2023-12-01 NOTE — ED Notes (Signed)
 This tech went to feed pt. She only ate a few bites of the french toast and refused the sausage but, she did eat a whole banana. Pt drunk all of the orange juice and also some water. Pt states that she is full and does not want anything else. She is now resting with call light in reach, bed lock and lowered.

## 2023-12-01 NOTE — ED Notes (Signed)
 This NT and 2 additional staff members rolled and cleaned pt.  Bed locked and in lowest position, call bell within reach, RN notified.  Family member at bedside.

## 2023-12-01 NOTE — ED Notes (Signed)
 This NT offered to feed patient.  Pt states I haven't seen anyone all day, I feel like a thrown away piece of trash.  This NT again offered to feed this patient, pt declined stating My husband went to get me chicken, I will wait for him to feed me.  Safety measures in place, call bell within reach, RN [place] notified.

## 2023-12-01 NOTE — ED Notes (Signed)
 NT went in room to attempt to feed pt lunch, pt stated she would just like to sleep for now.

## 2023-12-02 DIAGNOSIS — Z7901 Long term (current) use of anticoagulants: Secondary | ICD-10-CM | POA: Diagnosis not present

## 2023-12-02 DIAGNOSIS — S0990XA Unspecified injury of head, initial encounter: Secondary | ICD-10-CM | POA: Diagnosis not present

## 2023-12-02 DIAGNOSIS — Z79899 Other long term (current) drug therapy: Secondary | ICD-10-CM | POA: Diagnosis not present

## 2023-12-02 DIAGNOSIS — M25561 Pain in right knee: Secondary | ICD-10-CM | POA: Diagnosis not present

## 2023-12-02 DIAGNOSIS — S42411A Displaced simple supracondylar fracture without intercondylar fracture of right humerus, initial encounter for closed fracture: Secondary | ICD-10-CM | POA: Diagnosis not present

## 2023-12-02 DIAGNOSIS — M25521 Pain in right elbow: Secondary | ICD-10-CM | POA: Diagnosis not present

## 2023-12-02 MED ORDER — CEFDINIR 300 MG PO CAPS: 300.0000 mg | ORAL_CAPSULE | Freq: Two times a day (BID) | ORAL | 0 refills | Status: AC

## 2023-12-02 MED ORDER — OXYCODONE-ACETAMINOPHEN 5-325 MG PO TABS: 1.0000 | ORAL_TABLET | Freq: Four times a day (QID) | ORAL | 0 refills | Status: AC | PRN

## 2023-12-02 NOTE — ED Notes (Signed)
Pt resting quietly in bed with eyes closed. Respirations even and unlabored. No S/S of distress noted. Call light within reach.  

## 2023-12-02 NOTE — ED Notes (Signed)
 Pt contact Diana [name] [phone] has requested that he be called when transport has picked up pt and that the RN that calls report also ask [facility] to call upon her arrival.

## 2023-12-02 NOTE — ED Notes (Signed)
 Diana [name] with [facility] rehab called CSW and stated that patient Diana [name] came back as approved. Patient will go to room 208-4. Call report number is [phone]. Nurse notified to call report and Secretary made aware for [facility] transport setup.  Patient is set to DC today to go to [facility]; Pt and daughter made aware at bedside. MD made aware. TOC signing off.

## 2023-12-02 NOTE — ED Notes (Signed)
 Pt sitting up in chair. Breakfast set up on bedside table within reach of patient. Patient denies further needs at this time. Call light within reach.

## 2023-12-02 NOTE — Progress Notes (Signed)
 Physical Therapy Treatment Patient Details Name: Waynesha Rammel MRN: [id number] DOB: 08-Jun-1934 Today's Date: 12/02/2023   History of Present Illness 89 y.o. F admitted on 11/22/23 due to fall at home. X-Ray's indicate a R distal humerus fracture. PMH significant for htn.    PT Comments  Patient agreeable for therapy.  Patient demonstrates slow labored movement for sitting up at bedside mostly due to none use of RUE due to NWB precaution, once seated able to maintain sitting balance with occasional left lateral leaning, fair/good return for completing BLE exercises while keeping trunk in midline and able to take a couple of steps and transfer to chair with knees blocked to avoid falling.  Patient tolerated sitting up in chair after therapy - nursing staff notified. Patient will benefit from continued skilled physical therapy in hospital and recommended venue below to increase strength, balance, endurance for safe ADLs and gait.      If plan is discharge home, recommend the following: A lot of help with walking and/or transfers;A lot of help with bathing/dressing/bathroom;Assistance with cooking/housework;Help with stairs or ramp for entrance   Can travel by private vehicle     No  Equipment Recommendations  None recommended by PT    Recommendations for Other Services       Precautions / Restrictions Precautions Precautions: Fall;Shoulder Type of Shoulder Precautions: R distal humerus fracture Shoulder Interventions: Shoulder sling/immobilizer Restrictions Weight Bearing Restrictions Per Provider Order: Yes RUE Weight Bearing Per Provider Order: Non weight bearing     Mobility  Bed Mobility Overal bed mobility: Needs Assistance Bed Mobility: Rolling, Supine to Sit, Sit to Supine Rolling: Mod assist   Supine to sit: Max assist     General bed mobility comments: increased time, labored movement    Transfers Overall transfer level: Needs assistance Equipment used:  Rolling walker (2 wheels) Transfers: Sit to/from Stand, Bed to chair/wheelchair/BSC Sit to Stand: Mod assist, Max assist   Step pivot transfers: Mod assist, Max assist       General transfer comment: required knees blocked, able to stand pivot weightbearing on LUE    Ambulation/Gait Ambulation/Gait assistance: Max assist Gait Distance (Feet): 2 Feet Assistive device: 1 person hand held assist Gait Pattern/deviations: Decreased step length - right, Decreased step length - left, Decreased stride length, Knees buckling Gait velocity: slow     General Gait Details: limited to a couple of side steps with buckling of knees due to weakness   Stairs             Wheelchair Mobility     Tilt Bed    Modified Rankin (Stroke Patients Only)       Balance                                            Cognition Arousal: Alert Behavior During Therapy: WFL for tasks assessed/performed Overall Cognitive Status: Within Functional Limits for tasks assessed                                          Exercises General Exercises - Lower Extremity Long Arc Quad: AAROM, Strengthening, Both, 10 reps, Seated Hip Flexion/Marching: AAROM, Strengthening, Both, 10 reps, Seated Toe Raises: AROM, Strengthening, Both, 10 reps, Seated Heel Raises: AROM, Strengthening, Both, 10 reps, Seated  General Comments        Pertinent Vitals/Pain Pain Assessment Pain Assessment: Faces Faces Pain Scale: Hurts little more Pain Location: RUE Pain Descriptors / Indicators: Discomfort, Grimacing, Guarding Pain Intervention(s): Limited activity within patient's tolerance, Monitored during session, Repositioned    Home Living                          Prior Function            PT Goals (current goals can now be found in the care plan section) Acute Rehab PT Goals Patient Stated Goal: return home after rehab PT Goal Formulation: With patient Time For  Goal Achievement: 12/07/23 Potential to Achieve Goals: Good Progress towards PT goals: Progressing toward goals    Frequency    Min 2X/week      PT Plan      Co-evaluation              AM-PAC PT 6 Clicks Mobility   Outcome Measure  Help needed turning from your back to your side while in a flat bed without using bedrails?: A Lot Help needed moving from lying on your back to sitting on the side of a flat bed without using bedrails?: A Lot Help needed moving to and from a bed to a chair (including a wheelchair)?: A Lot Help needed standing up from a chair using your arms (e.g., wheelchair or bedside chair)?: A Lot Help needed to walk in hospital room?: A Lot Help needed climbing 3-5 steps with a railing? : Total 6 Click Score: 11    End of Session   Activity Tolerance: Patient tolerated treatment well Patient left: in chair;with call bell/phone within reach Nurse Communication: Mobility status PT Visit Diagnosis: Unsteadiness on feet (R26.81);Muscle weakness (generalized) (M62.81);Other abnormalities of gait and mobility (R26.89) Pain - Right/Left: Right Pain - part of body: Arm;Knee     Time: 9086-9055 PT Time Calculation (min) (ACUTE ONLY): 31 min  Charges:    $Therapeutic Exercise: 8-22 mins $Therapeutic Activity: 8-22 mins PT General Charges $$ ACUTE PT VISIT: 1 Visit                     10:56 AM, 12/02/23 Lynwood Music, MPT Physical Therapist with [facility] 336 [phone] office [phone] mobile phone

## 2023-12-02 NOTE — ED Notes (Signed)
 Pt requested to get back in the bed after PT got her up to the chair this am. This tech and another RN went into room to help pt transfer to bed, pt was not able to bear weight with a 2 x staff assist and said she was not comfortable attempting to transfer unless a man was available to help her because she was scared she would fall.

## 2023-12-02 NOTE — ED Provider Notes (Signed)
 Emergency Medicine Observation Re-evaluation Note  Diana [name] is a 89 y.o. female, seen on rounds today.  Pt initially presented to the ED for complaints of Fall Currently, the patient is awaiting nursing home placement.  Physical Exam  BP (!) 150/81   Pulse 100   Temp 98.8 F (37.1 C) (Oral)   Resp 19   SpO2 100%  Physical Exam Awake in no acute distress  ED Course / MDM  EKG:   I have reviewed the labs performed to date as well as medications administered while in observation.  Recent changes in the last 24 hours include none.  Plan  Current plan is for nursing home placement.    Suzette Pac, MD 12/02/23 [phone]

## 2023-12-02 NOTE — ED Notes (Signed)
 PT assisted patient back to bed.

## 2023-12-02 NOTE — NC FL2 (Signed)
 [place]  MEDICAID FL2 LEVEL OF CARE FORM     IDENTIFICATION  Patient Name: Diana [name] Birthdate: 12-15-1933 Sex: female Admission Date (Current Location): 11/22/2023  [facility] and [place] Number:  [employer] and Address:  [facility],  [address] [place] [address]      Provider Number: [id number]  Attending Physician Name and Address:  System, Provider Not In  Relative Name and Phone Number:  Cain,Odell (Spouse)  [phone] [facility])    Current Level of Care: Hospital Recommended Level of Care: Skilled Nursing Facility Prior Approval Number:    Date Approved/Denied:   PASRR Number: [id number]  Discharge Plan: SNF    Current Diagnoses: Patient Active Problem List   Diagnosis Date Noted   Gross hematuria 03/17/2020   Atrial fibrillation with RVR (HCC) 01/23/2019   Unspecified atrial fibrillation (HCC) 01/20/2019   Closed fracture of lateral portion of right tibial plateau, with routine healing, subsequent encounter 01/18/19 01/20/2019   Fracture 01/19/2019   Anticoagulation adequate 08/14/2018   Obesity (BMI 30-39.9) 08/14/2018   Nausea and vomiting in adult 08/11/2017   Essential hypertension 08/11/2017   Pre-syncope 08/10/2017   Elevated troponin 08/10/2017   DVT (deep venous thrombosis) (HCC) 08/10/2016   Pulmonary embolism (HCC) 08/10/2016   HTN (hypertension) 08/10/2016    Orientation RESPIRATION BLADDER Height & Weight     Self, Situation, Place, Time  Normal Continent Weight:   Height:     BEHAVIORAL SYMPTOMS/MOOD NEUROLOGICAL BOWEL NUTRITION STATUS      Continent Diet (Regular)  AMBULATORY STATUS COMMUNICATION OF NEEDS Skin   Extensive Assist Verbally Normal                       Personal Care Assistance Level of Assistance  Bathing, Feeding, Dressing Bathing Assistance: Maximum assistance Feeding assistance: Limited assistance Dressing Assistance: Maximum assistance     Functional Limitations Info   Sight, Hearing, Speech Sight Info: Adequate Hearing Info: Adequate Speech Info: Adequate    SPECIAL CARE FACTORS FREQUENCY  PT (By licensed PT), OT (By licensed OT)     PT Frequency: x5/week OT Frequency: x5/week            Contractures Contractures Info: Not present    Additional Factors Info  Code Status, Allergies Code Status Info: Full Allergies Info: No Known Allergies           Current Medications (12/02/2023):  This is the current hospital active medication list Current Facility-Administered Medications  Medication Dose Route Frequency Provider Last Rate Last Admin   acetaminophen  (TYLENOL ) tablet 650 mg  650 mg Oral Q4H PRN Ula Prentice SAUNDERS, MD   650 mg at 11/30/23 1944   bisacodyl  (DULCOLAX) EC tablet 5 mg  5 mg Oral Daily Cleotilde Rogue, MD   5 mg at 12/02/23 9046   cefdinir  (OMNICEF ) capsule 300 mg  300 mg Oral Q12H Raford Lenis, MD   300 mg at 12/02/23 9046   ciprofloxacin  (CIPRO ) tablet 500 mg  500 mg Oral Once Sherrilee Belvie CROME, MD       docusate sodium  (COLACE) capsule 200 mg  200 mg Oral BID Mannie Pac T, DO   200 mg at 12/02/23 9046   HYDROcodone -acetaminophen  (NORCO/VICODIN) 5-325 MG per tablet 1 tablet  1 tablet Oral Q6H PRN Ula Prentice SAUNDERS, MD   1 tablet at 12/02/23 9045   losartan  (COZAAR ) tablet 100 mg  100 mg Oral QHS Ula Prentice SAUNDERS, MD   100 mg at 12/01/23 2145  melatonin tablet 3 mg  3 mg Oral QHS Mesner, Jason, MD   3 mg at 12/01/23 2145   metoprolol  succinate (TOPROL -XL) 24 hr tablet 50 mg  50 mg Oral QHS Tee, Andrew R, MD   50 mg at 12/01/23 2146   polymixin-bacitracin  (POLYSPORIN ) ointment   Topical BID Raford Lenis, MD   Given at 12/02/23 9045   rivaroxaban  (XARELTO ) tablet 20 mg  20 mg Oral QHS Ula Prentice SAUNDERS, MD   20 mg at 12/01/23 2145   Current Outpatient Medications  Medication Sig Dispense Refill   acetaminophen  (TYLENOL ) 500 MG tablet Take 500 mg by mouth at bedtime.     amLODipine  (NORVASC ) 2.5 MG tablet Take 2.5 mg by mouth daily.      Ascorbic Acid (VITAMIN C) 100 MG tablet Take 100 mg by mouth daily.     cholecalciferol  (VITAMIN D3) 25 MCG (1000 UT) tablet Take 1,000 Units by mouth daily.     doxycycline  (VIBRAMYCIN ) 100 MG capsule Take 100 mg by mouth 2 (two) times daily.     ELDERBERRY PO Take 1 tablet by mouth daily.     latanoprost  (XALATAN ) 0.005 % ophthalmic solution Place 1 drop into both eyes at bedtime.     losartan  (COZAAR ) 100 MG tablet Take 100 mg by mouth daily.     metoprolol  succinate (TOPROL -XL) 50 MG 24 hr tablet Take 50 mg by mouth daily.     Misc Natural Products (BEET ROOT PO) Take 3 tablets by mouth daily.     oxyCODONE -acetaminophen  (PERCOCET/ROXICET) 5-325 MG tablet Take 1 tablet by mouth every 6 (six) hours as needed for severe pain (pain score 7-10). 15 tablet 0   rivaroxaban  (XARELTO ) 20 MG TABS tablet Take 20 mg by mouth daily.       Discharge Medications: Please see After Visit summary for a list of discharge medications.  Relevant Imaging Results:  Relevant Lab Results:   Additional Information SSN: [id number]  [name], LCSWA

## 2023-12-02 NOTE — ED Notes (Signed)
 Pt care taken, said that she has not had anything to eat tonight. Heated her dinner tray that was on the bedside table and assisted her eating. More content after. Told her that she was probably going to have to wait another night before she went to the nursing home due to weather. She was understanding of that.

## 2023-12-03 DIAGNOSIS — R5381 Other malaise: Secondary | ICD-10-CM | POA: Diagnosis not present

## 2023-12-03 DIAGNOSIS — N390 Urinary tract infection, site not specified: Secondary | ICD-10-CM | POA: Diagnosis not present

## 2023-12-03 DIAGNOSIS — R278 Other lack of coordination: Secondary | ICD-10-CM | POA: Diagnosis not present

## 2023-12-03 DIAGNOSIS — E669 Obesity, unspecified: Secondary | ICD-10-CM | POA: Diagnosis not present

## 2023-12-03 DIAGNOSIS — I4891 Unspecified atrial fibrillation: Secondary | ICD-10-CM | POA: Diagnosis not present

## 2023-12-03 DIAGNOSIS — Z79899 Other long term (current) drug therapy: Secondary | ICD-10-CM | POA: Diagnosis not present

## 2023-12-03 DIAGNOSIS — S0990XA Unspecified injury of head, initial encounter: Secondary | ICD-10-CM | POA: Diagnosis not present

## 2023-12-03 DIAGNOSIS — H409 Unspecified glaucoma: Secondary | ICD-10-CM | POA: Diagnosis not present

## 2023-12-03 DIAGNOSIS — R531 Weakness: Secondary | ICD-10-CM | POA: Diagnosis not present

## 2023-12-03 DIAGNOSIS — Z86718 Personal history of other venous thrombosis and embolism: Secondary | ICD-10-CM | POA: Diagnosis not present

## 2023-12-03 DIAGNOSIS — Z7401 Bed confinement status: Secondary | ICD-10-CM | POA: Diagnosis not present

## 2023-12-03 DIAGNOSIS — R2689 Other abnormalities of gait and mobility: Secondary | ICD-10-CM | POA: Diagnosis not present

## 2023-12-03 DIAGNOSIS — Z7901 Long term (current) use of anticoagulants: Secondary | ICD-10-CM | POA: Diagnosis not present

## 2023-12-03 DIAGNOSIS — I10 Essential (primary) hypertension: Secondary | ICD-10-CM | POA: Diagnosis not present

## 2023-12-03 DIAGNOSIS — S42411D Displaced simple supracondylar fracture without intercondylar fracture of right humerus, subsequent encounter for fracture with routine healing: Secondary | ICD-10-CM | POA: Diagnosis not present

## 2023-12-03 DIAGNOSIS — S42411A Displaced simple supracondylar fracture without intercondylar fracture of right humerus, initial encounter for closed fracture: Secondary | ICD-10-CM | POA: Diagnosis not present

## 2023-12-03 DIAGNOSIS — M25561 Pain in right knee: Secondary | ICD-10-CM | POA: Diagnosis not present

## 2023-12-03 DIAGNOSIS — M25521 Pain in right elbow: Secondary | ICD-10-CM | POA: Diagnosis not present

## 2023-12-03 DIAGNOSIS — M6281 Muscle weakness (generalized): Secondary | ICD-10-CM | POA: Diagnosis not present

## 2023-12-03 DIAGNOSIS — S42301A Unspecified fracture of shaft of humerus, right arm, initial encounter for closed fracture: Secondary | ICD-10-CM | POA: Diagnosis not present

## 2023-12-03 NOTE — ED Provider Notes (Signed)
 Emergency Medicine Observation Re-evaluation Note  Diana [name] is a 89 y.o. female, seen on rounds today.  Pt initially presented to the ED for complaints of Fall Currently, the patient is nursing home placement.  Physical Exam  BP 138/76 (BP Location: Left Arm)   Pulse 86   Temp 98.3 F (36.8 C) (Oral)   Resp 18   SpO2 99%  Physical Exam Awake and in no acute distress ED Course / MDM  EKG:   I have reviewed the labs performed to date as well as medications administered while in observation.  Recent changes in the last 24 hours include none.  Plan  Current plan is for nursing home placement.    Suzette Pac, MD 12/03/23 [phone]

## 2023-12-05 DIAGNOSIS — R5381 Other malaise: Secondary | ICD-10-CM | POA: Diagnosis not present

## 2023-12-05 DIAGNOSIS — S42301A Unspecified fracture of shaft of humerus, right arm, initial encounter for closed fracture: Secondary | ICD-10-CM | POA: Diagnosis not present

## 2023-12-07 DIAGNOSIS — S42411D Displaced simple supracondylar fracture without intercondylar fracture of right humerus, subsequent encounter for fracture with routine healing: Secondary | ICD-10-CM | POA: Diagnosis not present

## 2023-12-07 DIAGNOSIS — E669 Obesity, unspecified: Secondary | ICD-10-CM | POA: Diagnosis not present

## 2023-12-07 DIAGNOSIS — I4891 Unspecified atrial fibrillation: Secondary | ICD-10-CM | POA: Diagnosis not present

## 2023-12-07 DIAGNOSIS — N390 Urinary tract infection, site not specified: Secondary | ICD-10-CM | POA: Diagnosis not present

## 2023-12-07 DIAGNOSIS — H409 Unspecified glaucoma: Secondary | ICD-10-CM | POA: Diagnosis not present

## 2023-12-07 DIAGNOSIS — I10 Essential (primary) hypertension: Secondary | ICD-10-CM | POA: Diagnosis not present

## 2023-12-07 DIAGNOSIS — Z86718 Personal history of other venous thrombosis and embolism: Secondary | ICD-10-CM | POA: Diagnosis not present

## 2023-12-09 LAB — SUSCEPTIBILITY, AER + ANAEROB

## 2023-12-09 LAB — SUSCEPTIBILITY RESULT

## 2023-12-12 LAB — URINE CULTURE: Culture: >=100000 — AB

## 2023-12-13 ENCOUNTER — Telehealth (HOSPITAL_BASED_OUTPATIENT_CLINIC_OR_DEPARTMENT_OTHER): Payer: Self-pay

## 2023-12-13 NOTE — Telephone Encounter (Signed)
Post ED Visit - Positive Culture Follow-up  Culture report reviewed by antimicrobial stewardship pharmacist: [facility] [x]  Calton Dach, Pharm.D. []  Celedonio Miyamoto, Pharm.D., BCPS AQ-ID []  Garvin Fila, Pharm.D., BCPS []  Georgina Pillion, Pharm.D., BCPS []  [place], [place].D., BCPS, AAHIVP []  Estella Husk, Pharm.D., BCPS, AAHIVP []  Lysle Pearl, PharmD, BCPS []  Phillips Climes, PharmD, BCPS []  Agapito Games, PharmD, BCPS []  Verlan Friends, PharmD []  Mervyn Gay, PharmD, BCPS []  Vinnie Level, PharmD  [facility] []  Len Childs, PharmD []  Greer Pickerel, PharmD []  Adalberto Cole, PharmD []  Perlie Gold, Rph []  Lonell Face) Jean Rosenthal, PharmD []  Earl Many, PharmD []  Junita Push, PharmD []  Dorna Leitz, PharmD []  Terrilee Files, PharmD []  Lynann Beaver, PharmD []  Keturah Barre, PharmD []  Loralee Pacas, PharmD []  Bernadene Person, PharmD   Positive urine culture Treated with Cefdinir and Doxycycline, organism sensitive to the same and no further patient follow-up is required at this time.  Sandria Senter 12/13/2023, 9:39 AM

## 2023-12-14 NOTE — Progress Notes (Signed)
Office Visit Note   Patient: Diana [name]           Date of Birth: November 12, 1934           MRN: [id number] Visit Date: 12/16/2023 Requested by: Kirstie Peri, MD [address] [place],  [place] [address] PCP: Kirstie Peri, MD   Assessment & Plan:   Encounter Diagnosis  Name Primary?   Closed supracondylar fracture of right humerus, initial encounter Yes   24530 CPT   No orders of the defined types were placed in this encounter.   89 year old female supracondylar humerus fracture.  The patient is basically in a nursing home low demand recommend nonoperative treatment  SLING RIGHT ELBOW  START ROM EXERCISES  F/U IN 6 WEEKS; CK ROM AND XRAYS      Subjective: Chief Complaint  Patient presents with   Fracture    R elbowDOI 11/22/23    HPI: 89 year old female fell injured her right humerus sustained a supracondylar humerus fracture on December 31 she is here for follow-up after splinting in the ER.  ER splint was very inadequate neurovascular exam exam is intact              ROS: Noncontributory  Images personally read and my interpretation : Outside imaging transverse fracture supracondylar humerus nondisplaced maybe a little bit of angulation  Repeat imaging today  DG Elbow 2 Views Right Result Date: 12/16/2023 Imaging right elbow status post supracondylar humerus fracture on December 31 X-ray shows posterior displacement of the shaft in relation to the condyles and then we also see medial displacement of the distal fracture fragment related to the proximal fracture fragments.  The fracture has shown some signs of interval healing Slightly displaced supracondylar humerus fracture an 89 year old female who is in a nursing home and is low demand     Visit Diagnoses:  1. Closed supracondylar fracture of right humerus, initial encounter      Follow-Up Instructions: Return in about 6 weeks (around 01/27/2024) for FOLLOW UP, XRAYS, RIGHT, ELBOW.    Objective: Vital Signs: BP  (!) 150/94   Pulse 98   Physical Exam 89 year old female awake and alert oriented x 3 in a nursing home but alert and coherent  Ortho Exam Right elbow swelling tenderness also has some hand swelling but can move all the digits all nerves and vascular exam is intact.   DG Elbow 2 Views Right Result Date: 12/16/2023 Imaging right elbow status post supracondylar humerus fracture on December 31 X-ray shows posterior displacement of the shaft in relation to the condyles and then we also see medial displacement of the distal fracture fragment related to the proximal fracture fragments.  The fracture has shown some signs of interval healing Slightly displaced supracondylar humerus fracture an 89 year old female who is in a nursing home and is low demand     Specialty Comments:  No specialty comments available.  Imaging: DG Elbow 2 Views Right Result Date: 12/16/2023 Imaging right elbow status post supracondylar humerus fracture on December 31 X-ray shows posterior displacement of the shaft in relation to the condyles and then we also see medial displacement of the distal fracture fragment related to the proximal fracture fragments.  The fracture has shown some signs of interval healing Slightly displaced supracondylar humerus fracture an 89 year old female who is in a nursing home and is low demand     PMFS History: Patient Active Problem List   Diagnosis Date Noted   Gross hematuria 03/17/2020   Atrial fibrillation with  RVR (HCC) 01/23/2019   Unspecified atrial fibrillation (HCC) 01/20/2019   Closed fracture of lateral portion of right tibial plateau, with routine healing, subsequent encounter 01/18/19 01/20/2019   Fracture 01/19/2019   Anticoagulation adequate 08/14/2018   Obesity (BMI 30-39.9) 08/14/2018   Nausea and vomiting in adult 08/11/2017   Essential hypertension 08/11/2017   Pre-syncope 08/10/2017   Elevated troponin 08/10/2017   DVT (deep venous thrombosis) (HCC) 08/10/2016    Pulmonary embolism (HCC) 08/10/2016   HTN (hypertension) 08/10/2016   Past Medical History:  Diagnosis Date   Arthritis    Chronic deep vein thrombosis (DVT) of popliteal vein of left lower extremity (HCC)    Chronic venous insufficiency    DVT of lower extremity (deep venous thrombosis) (HCC)    Enlarged heart    Glaucoma    Hypertension    Hyperthyroidism    PE (pulmonary thromboembolism) (HCC)     No family history on file.  Past Surgical History:  Procedure Laterality Date   BREAST LUMPECTOMY     TUBAL LIGATION     Social History   Occupational History   Occupation: retired  Tobacco Use   Smoking status: Former   Smokeless tobacco: Never  Advertising account planner   Vaping status: Never Used  Substance and Sexual Activity   Alcohol use: No   Drug use: No   Sexual activity: Not on file

## 2023-12-16 ENCOUNTER — Encounter: Payer: Self-pay | Admitting: Orthopedic Surgery

## 2023-12-16 ENCOUNTER — Other Ambulatory Visit (INDEPENDENT_AMBULATORY_CARE_PROVIDER_SITE_OTHER): Payer: Self-pay

## 2023-12-16 ENCOUNTER — Ambulatory Visit (INDEPENDENT_AMBULATORY_CARE_PROVIDER_SITE_OTHER): Payer: Medicare HMO | Admitting: Orthopedic Surgery

## 2023-12-16 VITALS — BP 150/94 | HR 98

## 2023-12-16 DIAGNOSIS — S42411A Displaced simple supracondylar fracture without intercondylar fracture of right humerus, initial encounter for closed fracture: Secondary | ICD-10-CM | POA: Diagnosis not present

## 2023-12-16 NOTE — Patient Instructions (Signed)
SLING FOR COMFORT  START ROM EXERCISES   WBAT

## 2023-12-22 DIAGNOSIS — S42301A Unspecified fracture of shaft of humerus, right arm, initial encounter for closed fracture: Secondary | ICD-10-CM | POA: Diagnosis not present

## 2023-12-22 DIAGNOSIS — E669 Obesity, unspecified: Secondary | ICD-10-CM | POA: Diagnosis not present

## 2023-12-22 DIAGNOSIS — Z86718 Personal history of other venous thrombosis and embolism: Secondary | ICD-10-CM | POA: Diagnosis not present

## 2023-12-22 DIAGNOSIS — H409 Unspecified glaucoma: Secondary | ICD-10-CM | POA: Diagnosis not present

## 2023-12-22 DIAGNOSIS — S42411D Displaced simple supracondylar fracture without intercondylar fracture of right humerus, subsequent encounter for fracture with routine healing: Secondary | ICD-10-CM | POA: Diagnosis not present

## 2023-12-22 DIAGNOSIS — R5381 Other malaise: Secondary | ICD-10-CM | POA: Diagnosis not present

## 2023-12-22 DIAGNOSIS — N390 Urinary tract infection, site not specified: Secondary | ICD-10-CM | POA: Diagnosis not present

## 2023-12-22 DIAGNOSIS — I4891 Unspecified atrial fibrillation: Secondary | ICD-10-CM | POA: Diagnosis not present

## 2023-12-22 DIAGNOSIS — I10 Essential (primary) hypertension: Secondary | ICD-10-CM | POA: Diagnosis not present

## 2024-01-03 DIAGNOSIS — M1710 Unilateral primary osteoarthritis, unspecified knee: Secondary | ICD-10-CM | POA: Diagnosis not present

## 2024-01-03 DIAGNOSIS — S42411D Displaced simple supracondylar fracture without intercondylar fracture of right humerus, subsequent encounter for fracture with routine healing: Secondary | ICD-10-CM | POA: Diagnosis not present

## 2024-01-03 DIAGNOSIS — I493 Ventricular premature depolarization: Secondary | ICD-10-CM | POA: Diagnosis not present

## 2024-01-03 DIAGNOSIS — I4891 Unspecified atrial fibrillation: Secondary | ICD-10-CM | POA: Diagnosis not present

## 2024-01-03 DIAGNOSIS — I700 Atherosclerosis of aorta: Secondary | ICD-10-CM | POA: Diagnosis not present

## 2024-01-03 DIAGNOSIS — Z6841 Body Mass Index (BMI) 40.0 and over, adult: Secondary | ICD-10-CM | POA: Diagnosis not present

## 2024-01-03 DIAGNOSIS — I739 Peripheral vascular disease, unspecified: Secondary | ICD-10-CM | POA: Diagnosis not present

## 2024-01-03 DIAGNOSIS — I10 Essential (primary) hypertension: Secondary | ICD-10-CM | POA: Diagnosis not present

## 2024-01-05 ENCOUNTER — Telehealth: Payer: Self-pay | Admitting: Orthopedic Surgery

## 2024-01-05 DIAGNOSIS — M1710 Unilateral primary osteoarthritis, unspecified knee: Secondary | ICD-10-CM | POA: Diagnosis not present

## 2024-01-05 DIAGNOSIS — Z6841 Body Mass Index (BMI) 40.0 and over, adult: Secondary | ICD-10-CM | POA: Diagnosis not present

## 2024-01-05 DIAGNOSIS — I10 Essential (primary) hypertension: Secondary | ICD-10-CM | POA: Diagnosis not present

## 2024-01-05 DIAGNOSIS — I493 Ventricular premature depolarization: Secondary | ICD-10-CM | POA: Diagnosis not present

## 2024-01-05 DIAGNOSIS — I4891 Unspecified atrial fibrillation: Secondary | ICD-10-CM | POA: Diagnosis not present

## 2024-01-05 DIAGNOSIS — S42411D Displaced simple supracondylar fracture without intercondylar fracture of right humerus, subsequent encounter for fracture with routine healing: Secondary | ICD-10-CM | POA: Diagnosis not present

## 2024-01-05 DIAGNOSIS — I700 Atherosclerosis of aorta: Secondary | ICD-10-CM | POA: Diagnosis not present

## 2024-01-05 DIAGNOSIS — I739 Peripheral vascular disease, unspecified: Secondary | ICD-10-CM | POA: Diagnosis not present

## 2024-01-05 NOTE — Telephone Encounter (Signed)
Dr. Mort Sawyers pt - spoke w/[name] w/[facility] [phone], she is wanting to know if the patient has any restrictions and if so, what they are.

## 2024-01-05 NOTE — Telephone Encounter (Signed)
Called [name], lvm with Dr. Mort Sawyers response

## 2024-01-09 ENCOUNTER — Telehealth: Payer: Self-pay

## 2024-01-09 NOTE — Telephone Encounter (Signed)
 Hope, [name] left message for you and I tried to help her but I didn't see what Dr. Romeo Apple had told you to tell her about the patient. [name] is wanting you to call her to go back over what the orders are for the patient, [facility].  [phone]

## 2024-01-10 DIAGNOSIS — I493 Ventricular premature depolarization: Secondary | ICD-10-CM | POA: Diagnosis not present

## 2024-01-10 DIAGNOSIS — I739 Peripheral vascular disease, unspecified: Secondary | ICD-10-CM | POA: Diagnosis not present

## 2024-01-10 DIAGNOSIS — I4891 Unspecified atrial fibrillation: Secondary | ICD-10-CM | POA: Diagnosis not present

## 2024-01-10 DIAGNOSIS — S42411D Displaced simple supracondylar fracture without intercondylar fracture of right humerus, subsequent encounter for fracture with routine healing: Secondary | ICD-10-CM | POA: Diagnosis not present

## 2024-01-10 DIAGNOSIS — Z6841 Body Mass Index (BMI) 40.0 and over, adult: Secondary | ICD-10-CM | POA: Diagnosis not present

## 2024-01-10 DIAGNOSIS — I700 Atherosclerosis of aorta: Secondary | ICD-10-CM | POA: Diagnosis not present

## 2024-01-10 DIAGNOSIS — M1710 Unilateral primary osteoarthritis, unspecified knee: Secondary | ICD-10-CM | POA: Diagnosis not present

## 2024-01-10 DIAGNOSIS — I10 Essential (primary) hypertension: Secondary | ICD-10-CM | POA: Diagnosis not present

## 2024-01-10 NOTE — Telephone Encounter (Signed)
 Called [name], lvm and read her what was in the pt's chart from Dr. Romeo Apple, Dr. Romeo Apple said "send them this" so I read it to [facility] again.  Advised if she has any questions to please call me.

## 2024-01-11 DIAGNOSIS — I4891 Unspecified atrial fibrillation: Secondary | ICD-10-CM | POA: Diagnosis not present

## 2024-01-11 DIAGNOSIS — I10 Essential (primary) hypertension: Secondary | ICD-10-CM | POA: Diagnosis not present

## 2024-01-11 DIAGNOSIS — Z6841 Body Mass Index (BMI) 40.0 and over, adult: Secondary | ICD-10-CM | POA: Diagnosis not present

## 2024-01-11 DIAGNOSIS — I739 Peripheral vascular disease, unspecified: Secondary | ICD-10-CM | POA: Diagnosis not present

## 2024-01-11 DIAGNOSIS — S42411D Displaced simple supracondylar fracture without intercondylar fracture of right humerus, subsequent encounter for fracture with routine healing: Secondary | ICD-10-CM | POA: Diagnosis not present

## 2024-01-11 DIAGNOSIS — I493 Ventricular premature depolarization: Secondary | ICD-10-CM | POA: Diagnosis not present

## 2024-01-11 DIAGNOSIS — I700 Atherosclerosis of aorta: Secondary | ICD-10-CM | POA: Diagnosis not present

## 2024-01-11 DIAGNOSIS — M1710 Unilateral primary osteoarthritis, unspecified knee: Secondary | ICD-10-CM | POA: Diagnosis not present

## 2024-01-11 NOTE — Telephone Encounter (Signed)
 Dr. Mort Sawyers pt -Diana [name] with [facility] [phone] lvm stating that she was told that they could start ROM exercises for the rt humeral fx, but she was not told if it was passive ROM or active ROM, she needs confirmation/clarification on what type of ROM Dr. Romeo Apple is wanting.

## 2024-01-17 DIAGNOSIS — M1710 Unilateral primary osteoarthritis, unspecified knee: Secondary | ICD-10-CM | POA: Diagnosis not present

## 2024-01-17 DIAGNOSIS — Z6841 Body Mass Index (BMI) 40.0 and over, adult: Secondary | ICD-10-CM | POA: Diagnosis not present

## 2024-01-17 DIAGNOSIS — I10 Essential (primary) hypertension: Secondary | ICD-10-CM | POA: Diagnosis not present

## 2024-01-17 DIAGNOSIS — S42411D Displaced simple supracondylar fracture without intercondylar fracture of right humerus, subsequent encounter for fracture with routine healing: Secondary | ICD-10-CM | POA: Diagnosis not present

## 2024-01-17 DIAGNOSIS — I493 Ventricular premature depolarization: Secondary | ICD-10-CM | POA: Diagnosis not present

## 2024-01-17 DIAGNOSIS — I4891 Unspecified atrial fibrillation: Secondary | ICD-10-CM | POA: Diagnosis not present

## 2024-01-17 DIAGNOSIS — I739 Peripheral vascular disease, unspecified: Secondary | ICD-10-CM | POA: Diagnosis not present

## 2024-01-17 DIAGNOSIS — I700 Atherosclerosis of aorta: Secondary | ICD-10-CM | POA: Diagnosis not present

## 2024-01-20 DIAGNOSIS — S42411D Displaced simple supracondylar fracture without intercondylar fracture of right humerus, subsequent encounter for fracture with routine healing: Secondary | ICD-10-CM | POA: Diagnosis not present

## 2024-01-20 DIAGNOSIS — I4891 Unspecified atrial fibrillation: Secondary | ICD-10-CM | POA: Diagnosis not present

## 2024-01-20 DIAGNOSIS — I10 Essential (primary) hypertension: Secondary | ICD-10-CM | POA: Diagnosis not present

## 2024-01-24 DIAGNOSIS — Z6841 Body Mass Index (BMI) 40.0 and over, adult: Secondary | ICD-10-CM | POA: Diagnosis not present

## 2024-01-24 DIAGNOSIS — I10 Essential (primary) hypertension: Secondary | ICD-10-CM | POA: Diagnosis not present

## 2024-01-24 DIAGNOSIS — I739 Peripheral vascular disease, unspecified: Secondary | ICD-10-CM | POA: Diagnosis not present

## 2024-01-24 DIAGNOSIS — M1710 Unilateral primary osteoarthritis, unspecified knee: Secondary | ICD-10-CM | POA: Diagnosis not present

## 2024-01-24 DIAGNOSIS — I493 Ventricular premature depolarization: Secondary | ICD-10-CM | POA: Diagnosis not present

## 2024-01-24 DIAGNOSIS — S42411D Displaced simple supracondylar fracture without intercondylar fracture of right humerus, subsequent encounter for fracture with routine healing: Secondary | ICD-10-CM | POA: Diagnosis not present

## 2024-01-24 DIAGNOSIS — I700 Atherosclerosis of aorta: Secondary | ICD-10-CM | POA: Diagnosis not present

## 2024-01-24 DIAGNOSIS — I4891 Unspecified atrial fibrillation: Secondary | ICD-10-CM | POA: Diagnosis not present

## 2024-01-25 DIAGNOSIS — S42411A Displaced simple supracondylar fracture without intercondylar fracture of right humerus, initial encounter for closed fracture: Secondary | ICD-10-CM | POA: Insufficient documentation

## 2024-01-27 ENCOUNTER — Encounter: Payer: Medicare HMO | Admitting: Orthopedic Surgery

## 2024-01-27 DIAGNOSIS — S42411D Displaced simple supracondylar fracture without intercondylar fracture of right humerus, subsequent encounter for fracture with routine healing: Secondary | ICD-10-CM

## 2024-01-27 DIAGNOSIS — I10 Essential (primary) hypertension: Secondary | ICD-10-CM | POA: Diagnosis not present

## 2024-01-27 DIAGNOSIS — I4891 Unspecified atrial fibrillation: Secondary | ICD-10-CM | POA: Diagnosis not present

## 2024-01-30 DIAGNOSIS — I739 Peripheral vascular disease, unspecified: Secondary | ICD-10-CM | POA: Diagnosis not present

## 2024-01-30 DIAGNOSIS — I493 Ventricular premature depolarization: Secondary | ICD-10-CM | POA: Diagnosis not present

## 2024-01-30 DIAGNOSIS — I10 Essential (primary) hypertension: Secondary | ICD-10-CM | POA: Diagnosis not present

## 2024-01-30 DIAGNOSIS — M1710 Unilateral primary osteoarthritis, unspecified knee: Secondary | ICD-10-CM | POA: Diagnosis not present

## 2024-01-30 DIAGNOSIS — I700 Atherosclerosis of aorta: Secondary | ICD-10-CM | POA: Diagnosis not present

## 2024-01-30 DIAGNOSIS — I4891 Unspecified atrial fibrillation: Secondary | ICD-10-CM | POA: Diagnosis not present

## 2024-01-30 DIAGNOSIS — Z6841 Body Mass Index (BMI) 40.0 and over, adult: Secondary | ICD-10-CM | POA: Diagnosis not present

## 2024-01-30 DIAGNOSIS — S42411D Displaced simple supracondylar fracture without intercondylar fracture of right humerus, subsequent encounter for fracture with routine healing: Secondary | ICD-10-CM | POA: Diagnosis not present

## 2024-01-31 DIAGNOSIS — Z6841 Body Mass Index (BMI) 40.0 and over, adult: Secondary | ICD-10-CM | POA: Diagnosis not present

## 2024-01-31 DIAGNOSIS — S42411D Displaced simple supracondylar fracture without intercondylar fracture of right humerus, subsequent encounter for fracture with routine healing: Secondary | ICD-10-CM | POA: Diagnosis not present

## 2024-01-31 DIAGNOSIS — I10 Essential (primary) hypertension: Secondary | ICD-10-CM | POA: Diagnosis not present

## 2024-01-31 DIAGNOSIS — I700 Atherosclerosis of aorta: Secondary | ICD-10-CM | POA: Diagnosis not present

## 2024-01-31 DIAGNOSIS — M1710 Unilateral primary osteoarthritis, unspecified knee: Secondary | ICD-10-CM | POA: Diagnosis not present

## 2024-01-31 DIAGNOSIS — I4891 Unspecified atrial fibrillation: Secondary | ICD-10-CM | POA: Diagnosis not present

## 2024-01-31 DIAGNOSIS — I739 Peripheral vascular disease, unspecified: Secondary | ICD-10-CM | POA: Diagnosis not present

## 2024-01-31 DIAGNOSIS — I493 Ventricular premature depolarization: Secondary | ICD-10-CM | POA: Diagnosis not present

## 2024-02-02 DIAGNOSIS — I4891 Unspecified atrial fibrillation: Secondary | ICD-10-CM | POA: Diagnosis not present

## 2024-02-02 DIAGNOSIS — M1710 Unilateral primary osteoarthritis, unspecified knee: Secondary | ICD-10-CM | POA: Diagnosis not present

## 2024-02-02 DIAGNOSIS — I10 Essential (primary) hypertension: Secondary | ICD-10-CM | POA: Diagnosis not present

## 2024-02-02 DIAGNOSIS — S42411D Displaced simple supracondylar fracture without intercondylar fracture of right humerus, subsequent encounter for fracture with routine healing: Secondary | ICD-10-CM | POA: Diagnosis not present

## 2024-02-02 DIAGNOSIS — I700 Atherosclerosis of aorta: Secondary | ICD-10-CM | POA: Diagnosis not present

## 2024-02-02 DIAGNOSIS — I493 Ventricular premature depolarization: Secondary | ICD-10-CM | POA: Diagnosis not present

## 2024-02-02 DIAGNOSIS — I739 Peripheral vascular disease, unspecified: Secondary | ICD-10-CM | POA: Diagnosis not present

## 2024-02-02 DIAGNOSIS — Z6841 Body Mass Index (BMI) 40.0 and over, adult: Secondary | ICD-10-CM | POA: Diagnosis not present

## 2024-02-06 DIAGNOSIS — I10 Essential (primary) hypertension: Secondary | ICD-10-CM | POA: Diagnosis not present

## 2024-02-06 DIAGNOSIS — M1710 Unilateral primary osteoarthritis, unspecified knee: Secondary | ICD-10-CM | POA: Diagnosis not present

## 2024-02-06 DIAGNOSIS — S42411D Displaced simple supracondylar fracture without intercondylar fracture of right humerus, subsequent encounter for fracture with routine healing: Secondary | ICD-10-CM | POA: Diagnosis not present

## 2024-02-06 DIAGNOSIS — I4891 Unspecified atrial fibrillation: Secondary | ICD-10-CM | POA: Diagnosis not present

## 2024-02-06 DIAGNOSIS — Z6841 Body Mass Index (BMI) 40.0 and over, adult: Secondary | ICD-10-CM | POA: Diagnosis not present

## 2024-02-06 DIAGNOSIS — I493 Ventricular premature depolarization: Secondary | ICD-10-CM | POA: Diagnosis not present

## 2024-02-06 DIAGNOSIS — I700 Atherosclerosis of aorta: Secondary | ICD-10-CM | POA: Diagnosis not present

## 2024-02-06 DIAGNOSIS — I739 Peripheral vascular disease, unspecified: Secondary | ICD-10-CM | POA: Diagnosis not present

## 2024-02-08 DIAGNOSIS — M1710 Unilateral primary osteoarthritis, unspecified knee: Secondary | ICD-10-CM | POA: Diagnosis not present

## 2024-02-08 DIAGNOSIS — I700 Atherosclerosis of aorta: Secondary | ICD-10-CM | POA: Diagnosis not present

## 2024-02-08 DIAGNOSIS — I4891 Unspecified atrial fibrillation: Secondary | ICD-10-CM | POA: Diagnosis not present

## 2024-02-08 DIAGNOSIS — I10 Essential (primary) hypertension: Secondary | ICD-10-CM | POA: Diagnosis not present

## 2024-02-08 DIAGNOSIS — S42411D Displaced simple supracondylar fracture without intercondylar fracture of right humerus, subsequent encounter for fracture with routine healing: Secondary | ICD-10-CM | POA: Diagnosis not present

## 2024-02-08 DIAGNOSIS — I493 Ventricular premature depolarization: Secondary | ICD-10-CM | POA: Diagnosis not present

## 2024-02-08 DIAGNOSIS — Z6841 Body Mass Index (BMI) 40.0 and over, adult: Secondary | ICD-10-CM | POA: Diagnosis not present

## 2024-02-08 DIAGNOSIS — I739 Peripheral vascular disease, unspecified: Secondary | ICD-10-CM | POA: Diagnosis not present

## 2024-02-13 ENCOUNTER — Other Ambulatory Visit (INDEPENDENT_AMBULATORY_CARE_PROVIDER_SITE_OTHER): Payer: Self-pay

## 2024-02-13 ENCOUNTER — Ambulatory Visit (INDEPENDENT_AMBULATORY_CARE_PROVIDER_SITE_OTHER): Admitting: Orthopedic Surgery

## 2024-02-13 ENCOUNTER — Encounter: Payer: Self-pay | Admitting: Orthopedic Surgery

## 2024-02-13 DIAGNOSIS — S42411A Displaced simple supracondylar fracture without intercondylar fracture of right humerus, initial encounter for closed fracture: Secondary | ICD-10-CM

## 2024-02-13 NOTE — Patient Instructions (Signed)
 Physical therapy  Patient is now full weightbearing with right upper extremity no restrictions

## 2024-02-13 NOTE — Progress Notes (Signed)
  Fracture care follow-up supracondylar fracture right elbow There were no vitals taken for this visit.  There is no height or weight on file to calculate BMI.  Chief Complaint  Patient presents with   Elbow Injury    11/22/23 right elbow fracture     Encounter Diagnosis  Name Primary?   Closed supracondylar fracture of right humerus, initial encounter Yes    DOI/DOS/ Date: 11/22/23  Improved  Patient has made significant improvements she has flexion to 125 degrees she has 20 degree loss of extension she has no pain at the fracture site except when she tries to Hyperflex the elbow  X-rays show slight angulation at the fracture site but excellent callus around the fracture site there is a slight flexion deformity.  See report  Recommend full weightbearing unrestricted PT follow-up as needed  DG Elbow 2 Views Right Result Date: 02/13/2024 Image report right elbow Flexion deformity of the right supracondylar elbow fracture ulnar humeral sacculation and radial head articulation remain normal no evidence of dislocation fracture appeals healed in a flexed position Healed fracture supracondylar elbow with flexion distal fragment

## 2024-02-13 NOTE — Progress Notes (Signed)
   There were no vitals taken for this visit.  There is no height or weight on file to calculate BMI.  Chief Complaint  Patient presents with   Elbow Injury    11/22/23 right elbow fracture     Encounter Diagnosis  Name Primary?   Closed supracondylar fracture of right humerus, initial encounter Yes    DOI/DOS/ Date: 11/22/23  Improved

## 2024-02-14 DIAGNOSIS — I700 Atherosclerosis of aorta: Secondary | ICD-10-CM | POA: Diagnosis not present

## 2024-02-14 DIAGNOSIS — I739 Peripheral vascular disease, unspecified: Secondary | ICD-10-CM | POA: Diagnosis not present

## 2024-02-14 DIAGNOSIS — I10 Essential (primary) hypertension: Secondary | ICD-10-CM | POA: Diagnosis not present

## 2024-02-14 DIAGNOSIS — S42411D Displaced simple supracondylar fracture without intercondylar fracture of right humerus, subsequent encounter for fracture with routine healing: Secondary | ICD-10-CM | POA: Diagnosis not present

## 2024-02-14 DIAGNOSIS — M1710 Unilateral primary osteoarthritis, unspecified knee: Secondary | ICD-10-CM | POA: Diagnosis not present

## 2024-02-14 DIAGNOSIS — Z6841 Body Mass Index (BMI) 40.0 and over, adult: Secondary | ICD-10-CM | POA: Diagnosis not present

## 2024-02-14 DIAGNOSIS — I493 Ventricular premature depolarization: Secondary | ICD-10-CM | POA: Diagnosis not present

## 2024-02-14 DIAGNOSIS — I4891 Unspecified atrial fibrillation: Secondary | ICD-10-CM | POA: Diagnosis not present

## 2024-02-16 DIAGNOSIS — I493 Ventricular premature depolarization: Secondary | ICD-10-CM | POA: Diagnosis not present

## 2024-02-16 DIAGNOSIS — S42411D Displaced simple supracondylar fracture without intercondylar fracture of right humerus, subsequent encounter for fracture with routine healing: Secondary | ICD-10-CM | POA: Diagnosis not present

## 2024-02-16 DIAGNOSIS — I739 Peripheral vascular disease, unspecified: Secondary | ICD-10-CM | POA: Diagnosis not present

## 2024-02-16 DIAGNOSIS — Z6841 Body Mass Index (BMI) 40.0 and over, adult: Secondary | ICD-10-CM | POA: Diagnosis not present

## 2024-02-16 DIAGNOSIS — I10 Essential (primary) hypertension: Secondary | ICD-10-CM | POA: Diagnosis not present

## 2024-02-16 DIAGNOSIS — I700 Atherosclerosis of aorta: Secondary | ICD-10-CM | POA: Diagnosis not present

## 2024-02-16 DIAGNOSIS — M1710 Unilateral primary osteoarthritis, unspecified knee: Secondary | ICD-10-CM | POA: Diagnosis not present

## 2024-02-16 DIAGNOSIS — I4891 Unspecified atrial fibrillation: Secondary | ICD-10-CM | POA: Diagnosis not present

## 2024-02-21 DIAGNOSIS — M1710 Unilateral primary osteoarthritis, unspecified knee: Secondary | ICD-10-CM | POA: Diagnosis not present

## 2024-02-21 DIAGNOSIS — I493 Ventricular premature depolarization: Secondary | ICD-10-CM | POA: Diagnosis not present

## 2024-02-21 DIAGNOSIS — I739 Peripheral vascular disease, unspecified: Secondary | ICD-10-CM | POA: Diagnosis not present

## 2024-02-21 DIAGNOSIS — I10 Essential (primary) hypertension: Secondary | ICD-10-CM | POA: Diagnosis not present

## 2024-02-21 DIAGNOSIS — I4891 Unspecified atrial fibrillation: Secondary | ICD-10-CM | POA: Diagnosis not present

## 2024-02-21 DIAGNOSIS — I700 Atherosclerosis of aorta: Secondary | ICD-10-CM | POA: Diagnosis not present

## 2024-02-21 DIAGNOSIS — Z6841 Body Mass Index (BMI) 40.0 and over, adult: Secondary | ICD-10-CM | POA: Diagnosis not present

## 2024-02-21 DIAGNOSIS — S42411D Displaced simple supracondylar fracture without intercondylar fracture of right humerus, subsequent encounter for fracture with routine healing: Secondary | ICD-10-CM | POA: Diagnosis not present

## 2024-02-28 DIAGNOSIS — I493 Ventricular premature depolarization: Secondary | ICD-10-CM | POA: Diagnosis not present

## 2024-02-28 DIAGNOSIS — Z6841 Body Mass Index (BMI) 40.0 and over, adult: Secondary | ICD-10-CM | POA: Diagnosis not present

## 2024-02-28 DIAGNOSIS — M1710 Unilateral primary osteoarthritis, unspecified knee: Secondary | ICD-10-CM | POA: Diagnosis not present

## 2024-02-28 DIAGNOSIS — I700 Atherosclerosis of aorta: Secondary | ICD-10-CM | POA: Diagnosis not present

## 2024-02-28 DIAGNOSIS — I10 Essential (primary) hypertension: Secondary | ICD-10-CM | POA: Diagnosis not present

## 2024-02-28 DIAGNOSIS — I4891 Unspecified atrial fibrillation: Secondary | ICD-10-CM | POA: Diagnosis not present

## 2024-02-28 DIAGNOSIS — S42411D Displaced simple supracondylar fracture without intercondylar fracture of right humerus, subsequent encounter for fracture with routine healing: Secondary | ICD-10-CM | POA: Diagnosis not present

## 2024-02-28 DIAGNOSIS — I739 Peripheral vascular disease, unspecified: Secondary | ICD-10-CM | POA: Diagnosis not present

## 2024-02-29 DIAGNOSIS — Z6835 Body mass index (BMI) 35.0-35.9, adult: Secondary | ICD-10-CM | POA: Diagnosis not present

## 2024-02-29 DIAGNOSIS — Z1339 Encounter for screening examination for other mental health and behavioral disorders: Secondary | ICD-10-CM | POA: Diagnosis not present

## 2024-02-29 DIAGNOSIS — Z7189 Other specified counseling: Secondary | ICD-10-CM | POA: Diagnosis not present

## 2024-02-29 DIAGNOSIS — I10 Essential (primary) hypertension: Secondary | ICD-10-CM | POA: Diagnosis not present

## 2024-02-29 DIAGNOSIS — Z1331 Encounter for screening for depression: Secondary | ICD-10-CM | POA: Diagnosis not present

## 2024-02-29 DIAGNOSIS — Z299 Encounter for prophylactic measures, unspecified: Secondary | ICD-10-CM | POA: Diagnosis not present

## 2024-02-29 DIAGNOSIS — Z0000 Encounter for general adult medical examination without abnormal findings: Secondary | ICD-10-CM | POA: Diagnosis not present

## 2024-02-29 DIAGNOSIS — R5383 Other fatigue: Secondary | ICD-10-CM | POA: Diagnosis not present

## 2024-03-02 DIAGNOSIS — M1710 Unilateral primary osteoarthritis, unspecified knee: Secondary | ICD-10-CM | POA: Diagnosis not present

## 2024-03-02 DIAGNOSIS — I700 Atherosclerosis of aorta: Secondary | ICD-10-CM | POA: Diagnosis not present

## 2024-03-02 DIAGNOSIS — Z6841 Body Mass Index (BMI) 40.0 and over, adult: Secondary | ICD-10-CM | POA: Diagnosis not present

## 2024-03-02 DIAGNOSIS — S42411D Displaced simple supracondylar fracture without intercondylar fracture of right humerus, subsequent encounter for fracture with routine healing: Secondary | ICD-10-CM | POA: Diagnosis not present

## 2024-03-02 DIAGNOSIS — I739 Peripheral vascular disease, unspecified: Secondary | ICD-10-CM | POA: Diagnosis not present

## 2024-03-02 DIAGNOSIS — I10 Essential (primary) hypertension: Secondary | ICD-10-CM | POA: Diagnosis not present

## 2024-03-02 DIAGNOSIS — I4891 Unspecified atrial fibrillation: Secondary | ICD-10-CM | POA: Diagnosis not present

## 2024-03-02 DIAGNOSIS — I493 Ventricular premature depolarization: Secondary | ICD-10-CM | POA: Diagnosis not present

## 2024-03-03 DIAGNOSIS — Z6841 Body Mass Index (BMI) 40.0 and over, adult: Secondary | ICD-10-CM | POA: Diagnosis not present

## 2024-03-03 DIAGNOSIS — M1710 Unilateral primary osteoarthritis, unspecified knee: Secondary | ICD-10-CM | POA: Diagnosis not present

## 2024-03-03 DIAGNOSIS — I4891 Unspecified atrial fibrillation: Secondary | ICD-10-CM | POA: Diagnosis not present

## 2024-03-03 DIAGNOSIS — I700 Atherosclerosis of aorta: Secondary | ICD-10-CM | POA: Diagnosis not present

## 2024-03-03 DIAGNOSIS — I10 Essential (primary) hypertension: Secondary | ICD-10-CM | POA: Diagnosis not present

## 2024-03-03 DIAGNOSIS — I493 Ventricular premature depolarization: Secondary | ICD-10-CM | POA: Diagnosis not present

## 2024-03-03 DIAGNOSIS — I739 Peripheral vascular disease, unspecified: Secondary | ICD-10-CM | POA: Diagnosis not present

## 2024-03-03 DIAGNOSIS — S42411D Displaced simple supracondylar fracture without intercondylar fracture of right humerus, subsequent encounter for fracture with routine healing: Secondary | ICD-10-CM | POA: Diagnosis not present

## 2024-03-08 DIAGNOSIS — I700 Atherosclerosis of aorta: Secondary | ICD-10-CM | POA: Diagnosis not present

## 2024-03-08 DIAGNOSIS — I493 Ventricular premature depolarization: Secondary | ICD-10-CM | POA: Diagnosis not present

## 2024-03-08 DIAGNOSIS — I4891 Unspecified atrial fibrillation: Secondary | ICD-10-CM | POA: Diagnosis not present

## 2024-03-08 DIAGNOSIS — I739 Peripheral vascular disease, unspecified: Secondary | ICD-10-CM | POA: Diagnosis not present

## 2024-03-08 DIAGNOSIS — M1710 Unilateral primary osteoarthritis, unspecified knee: Secondary | ICD-10-CM | POA: Diagnosis not present

## 2024-03-08 DIAGNOSIS — I10 Essential (primary) hypertension: Secondary | ICD-10-CM | POA: Diagnosis not present

## 2024-03-08 DIAGNOSIS — S42411D Displaced simple supracondylar fracture without intercondylar fracture of right humerus, subsequent encounter for fracture with routine healing: Secondary | ICD-10-CM | POA: Diagnosis not present

## 2024-03-08 DIAGNOSIS — Z6841 Body Mass Index (BMI) 40.0 and over, adult: Secondary | ICD-10-CM | POA: Diagnosis not present

## 2024-03-13 DIAGNOSIS — M1710 Unilateral primary osteoarthritis, unspecified knee: Secondary | ICD-10-CM | POA: Diagnosis not present

## 2024-03-13 DIAGNOSIS — Z6841 Body Mass Index (BMI) 40.0 and over, adult: Secondary | ICD-10-CM | POA: Diagnosis not present

## 2024-03-13 DIAGNOSIS — I739 Peripheral vascular disease, unspecified: Secondary | ICD-10-CM | POA: Diagnosis not present

## 2024-03-13 DIAGNOSIS — S42411D Displaced simple supracondylar fracture without intercondylar fracture of right humerus, subsequent encounter for fracture with routine healing: Secondary | ICD-10-CM | POA: Diagnosis not present

## 2024-03-13 DIAGNOSIS — I10 Essential (primary) hypertension: Secondary | ICD-10-CM | POA: Diagnosis not present

## 2024-03-13 DIAGNOSIS — I4891 Unspecified atrial fibrillation: Secondary | ICD-10-CM | POA: Diagnosis not present

## 2024-03-13 DIAGNOSIS — I493 Ventricular premature depolarization: Secondary | ICD-10-CM | POA: Diagnosis not present

## 2024-03-13 DIAGNOSIS — I700 Atherosclerosis of aorta: Secondary | ICD-10-CM | POA: Diagnosis not present

## 2024-03-20 DIAGNOSIS — I739 Peripheral vascular disease, unspecified: Secondary | ICD-10-CM | POA: Diagnosis not present

## 2024-03-20 DIAGNOSIS — I10 Essential (primary) hypertension: Secondary | ICD-10-CM | POA: Diagnosis not present

## 2024-03-20 DIAGNOSIS — I700 Atherosclerosis of aorta: Secondary | ICD-10-CM | POA: Diagnosis not present

## 2024-03-20 DIAGNOSIS — I4891 Unspecified atrial fibrillation: Secondary | ICD-10-CM | POA: Diagnosis not present

## 2024-03-20 DIAGNOSIS — I493 Ventricular premature depolarization: Secondary | ICD-10-CM | POA: Diagnosis not present

## 2024-03-20 DIAGNOSIS — S42411D Displaced simple supracondylar fracture without intercondylar fracture of right humerus, subsequent encounter for fracture with routine healing: Secondary | ICD-10-CM | POA: Diagnosis not present

## 2024-03-20 DIAGNOSIS — Z6841 Body Mass Index (BMI) 40.0 and over, adult: Secondary | ICD-10-CM | POA: Diagnosis not present

## 2024-03-20 DIAGNOSIS — M1710 Unilateral primary osteoarthritis, unspecified knee: Secondary | ICD-10-CM | POA: Diagnosis not present

## 2024-03-21 DIAGNOSIS — I700 Atherosclerosis of aorta: Secondary | ICD-10-CM | POA: Diagnosis not present

## 2024-03-21 DIAGNOSIS — S42411D Displaced simple supracondylar fracture without intercondylar fracture of right humerus, subsequent encounter for fracture with routine healing: Secondary | ICD-10-CM | POA: Diagnosis not present

## 2024-03-21 DIAGNOSIS — I10 Essential (primary) hypertension: Secondary | ICD-10-CM | POA: Diagnosis not present

## 2024-03-21 DIAGNOSIS — I4891 Unspecified atrial fibrillation: Secondary | ICD-10-CM | POA: Diagnosis not present

## 2024-03-26 DIAGNOSIS — M1710 Unilateral primary osteoarthritis, unspecified knee: Secondary | ICD-10-CM | POA: Diagnosis not present

## 2024-03-26 DIAGNOSIS — S42411D Displaced simple supracondylar fracture without intercondylar fracture of right humerus, subsequent encounter for fracture with routine healing: Secondary | ICD-10-CM | POA: Diagnosis not present

## 2024-03-26 DIAGNOSIS — I700 Atherosclerosis of aorta: Secondary | ICD-10-CM | POA: Diagnosis not present

## 2024-03-26 DIAGNOSIS — I739 Peripheral vascular disease, unspecified: Secondary | ICD-10-CM | POA: Diagnosis not present

## 2024-03-26 DIAGNOSIS — I493 Ventricular premature depolarization: Secondary | ICD-10-CM | POA: Diagnosis not present

## 2024-03-26 DIAGNOSIS — Z6841 Body Mass Index (BMI) 40.0 and over, adult: Secondary | ICD-10-CM | POA: Diagnosis not present

## 2024-03-26 DIAGNOSIS — I10 Essential (primary) hypertension: Secondary | ICD-10-CM | POA: Diagnosis not present

## 2024-03-26 DIAGNOSIS — I4891 Unspecified atrial fibrillation: Secondary | ICD-10-CM | POA: Diagnosis not present
